# Patient Record
Sex: Female | Born: 1955
Health system: Southern US, Community
[De-identification: ages and names within clinical notes are randomized; demographics above are authoritative.]

## PROBLEM LIST (undated history)

## (undated) DIAGNOSIS — F32A Depression, unspecified: Secondary | ICD-10-CM

## (undated) DIAGNOSIS — K219 Gastro-esophageal reflux disease without esophagitis: Secondary | ICD-10-CM

## (undated) DIAGNOSIS — I499 Cardiac arrhythmia, unspecified: Secondary | ICD-10-CM

## (undated) DIAGNOSIS — Z9981 Dependence on supplemental oxygen: Secondary | ICD-10-CM

## (undated) DIAGNOSIS — E039 Hypothyroidism, unspecified: Secondary | ICD-10-CM

## (undated) DIAGNOSIS — I10 Essential (primary) hypertension: Secondary | ICD-10-CM

## (undated) DIAGNOSIS — J45909 Unspecified asthma, uncomplicated: Secondary | ICD-10-CM

## (undated) DIAGNOSIS — J449 Chronic obstructive pulmonary disease, unspecified: Secondary | ICD-10-CM

## (undated) HISTORY — DX: Depression, unspecified: F32.A

## (undated) HISTORY — DX: Unspecified asthma, uncomplicated: J45.909

## (undated) HISTORY — DX: Cardiac arrhythmia, unspecified: I49.9

## (undated) HISTORY — DX: Hypothyroidism, unspecified: E03.9

## (undated) HISTORY — PX: OTHER SURGICAL HISTORY: SHX169

---

## 2000-06-06 ENCOUNTER — Ambulatory Visit (HOSPITAL_COMMUNITY): Admission: RE | Admit: 2000-06-06 | Discharge: 2000-06-06 | Payer: Self-pay | Admitting: General Surgery

## 2000-06-26 ENCOUNTER — Ambulatory Visit (HOSPITAL_COMMUNITY): Admission: RE | Admit: 2000-06-26 | Discharge: 2000-06-26 | Payer: Self-pay | Admitting: General Surgery

## 2000-06-26 ENCOUNTER — Encounter: Payer: Self-pay | Admitting: General Surgery

## 2001-11-03 ENCOUNTER — Emergency Department (HOSPITAL_COMMUNITY): Admission: EM | Admit: 2001-11-03 | Discharge: 2001-11-03 | Payer: Self-pay | Admitting: Emergency Medicine

## 2001-11-03 ENCOUNTER — Encounter: Payer: Self-pay | Admitting: Emergency Medicine

## 2001-11-05 ENCOUNTER — Ambulatory Visit (HOSPITAL_COMMUNITY): Admission: RE | Admit: 2001-11-05 | Discharge: 2001-11-05 | Payer: Self-pay | Admitting: Internal Medicine

## 2001-11-05 ENCOUNTER — Encounter: Payer: Self-pay | Admitting: Internal Medicine

## 2002-01-27 ENCOUNTER — Ambulatory Visit (HOSPITAL_COMMUNITY): Admission: RE | Admit: 2002-01-27 | Discharge: 2002-01-27 | Payer: Self-pay | Admitting: Internal Medicine

## 2002-01-27 ENCOUNTER — Encounter: Payer: Self-pay | Admitting: Internal Medicine

## 2002-09-13 ENCOUNTER — Ambulatory Visit (HOSPITAL_COMMUNITY): Admission: RE | Admit: 2002-09-13 | Discharge: 2002-09-13 | Payer: Self-pay | Admitting: Family Medicine

## 2002-09-13 ENCOUNTER — Encounter: Payer: Self-pay | Admitting: Family Medicine

## 2003-03-10 ENCOUNTER — Inpatient Hospital Stay (HOSPITAL_COMMUNITY): Admission: AD | Admit: 2003-03-10 | Discharge: 2003-03-13 | Payer: Self-pay | Admitting: Family Medicine

## 2003-07-20 ENCOUNTER — Emergency Department (HOSPITAL_COMMUNITY): Admission: EM | Admit: 2003-07-20 | Discharge: 2003-07-20 | Payer: Self-pay | Admitting: Emergency Medicine

## 2003-07-27 ENCOUNTER — Ambulatory Visit (HOSPITAL_COMMUNITY): Admission: RE | Admit: 2003-07-27 | Discharge: 2003-07-27 | Payer: Self-pay | Admitting: Orthopaedic Surgery

## 2005-07-13 ENCOUNTER — Emergency Department (HOSPITAL_COMMUNITY): Admission: EM | Admit: 2005-07-13 | Discharge: 2005-07-13 | Payer: Self-pay | Admitting: Emergency Medicine

## 2006-07-24 ENCOUNTER — Ambulatory Visit (HOSPITAL_COMMUNITY): Admission: RE | Admit: 2006-07-24 | Discharge: 2006-07-24 | Payer: Self-pay | Admitting: Family Medicine

## 2007-03-06 ENCOUNTER — Ambulatory Visit (HOSPITAL_COMMUNITY): Admission: RE | Admit: 2007-03-06 | Discharge: 2007-03-06 | Payer: Self-pay | Admitting: Family Medicine

## 2008-09-02 ENCOUNTER — Ambulatory Visit (HOSPITAL_COMMUNITY): Admission: RE | Admit: 2008-09-02 | Discharge: 2008-09-02 | Payer: Self-pay | Admitting: Specialist

## 2009-02-10 ENCOUNTER — Inpatient Hospital Stay (HOSPITAL_COMMUNITY): Admission: EM | Admit: 2009-02-10 | Discharge: 2009-02-13 | Payer: Self-pay | Admitting: Emergency Medicine

## 2009-03-05 ENCOUNTER — Ambulatory Visit (HOSPITAL_COMMUNITY): Admission: RE | Admit: 2009-03-05 | Discharge: 2009-03-05 | Payer: Self-pay | Admitting: Family Medicine

## 2009-03-25 ENCOUNTER — Emergency Department (HOSPITAL_COMMUNITY): Admission: EM | Admit: 2009-03-25 | Discharge: 2009-03-25 | Payer: Self-pay | Admitting: Emergency Medicine

## 2009-04-01 ENCOUNTER — Encounter (HOSPITAL_COMMUNITY): Admission: RE | Admit: 2009-04-01 | Discharge: 2009-05-01 | Payer: Self-pay | Admitting: Family Medicine

## 2009-11-12 ENCOUNTER — Emergency Department (HOSPITAL_COMMUNITY): Admission: EM | Admit: 2009-11-12 | Discharge: 2009-11-12 | Payer: Self-pay | Admitting: Emergency Medicine

## 2010-02-20 ENCOUNTER — Encounter: Payer: Self-pay | Admitting: Family Medicine

## 2010-04-16 LAB — RAPID URINE DRUG SCREEN, HOSP PERFORMED
Amphetamines: NOT DETECTED
Barbiturates: NOT DETECTED
Benzodiazepines: NOT DETECTED
Cocaine: NOT DETECTED
Opiates: NOT DETECTED

## 2010-04-16 LAB — DIFFERENTIAL
Basophils Absolute: 0 10*3/uL (ref 0.0–0.1)
Eosinophils Absolute: 0 10*3/uL (ref 0.0–0.7)
Eosinophils Relative: 0 % (ref 0–5)
Eosinophils Relative: 1 % (ref 0–5)
Lymphocytes Relative: 17 % (ref 12–46)
Lymphocytes Relative: 19 % (ref 12–46)
Lymphocytes Relative: 40 % (ref 12–46)
Lymphs Abs: 0.8 10*3/uL (ref 0.7–4.0)
Lymphs Abs: 1.2 10*3/uL (ref 0.7–4.0)
Monocytes Absolute: 0.4 10*3/uL (ref 0.1–1.0)
Monocytes Relative: 12 % (ref 3–12)
Monocytes Relative: 8 % (ref 3–12)
Neutro Abs: 3.1 10*3/uL (ref 1.7–7.7)
Neutrophils Relative %: 69 % (ref 43–77)
Neutrophils Relative %: 72 % (ref 43–77)

## 2010-04-16 LAB — FOLATE: Folate: 16.4 ng/mL

## 2010-04-16 LAB — BLOOD GAS, ARTERIAL
Acid-base deficit: 1.5 mmol/L (ref 0.0–2.0)
Bicarbonate: 23.5 mEq/L (ref 20.0–24.0)
pCO2 arterial: 45.2 mmHg — ABNORMAL HIGH (ref 35.0–45.0)
pH, Arterial: 7.336 — ABNORMAL LOW (ref 7.350–7.400)

## 2010-04-16 LAB — URINALYSIS, ROUTINE W REFLEX MICROSCOPIC
Bilirubin Urine: NEGATIVE
Glucose, UA: NEGATIVE mg/dL
Ketones, ur: NEGATIVE mg/dL
Nitrite: POSITIVE — AB
Protein, ur: NEGATIVE mg/dL
pH: 6.5 (ref 5.0–8.0)

## 2010-04-16 LAB — CARDIAC PANEL(CRET KIN+CKTOT+MB+TROPI)
CK, MB: 0.5 ng/mL (ref 0.3–4.0)
CK, MB: 0.7 ng/mL (ref 0.3–4.0)
Troponin I: 0.01 ng/mL (ref 0.00–0.06)
Troponin I: 0.02 ng/mL (ref 0.00–0.06)

## 2010-04-16 LAB — FERRITIN: Ferritin: 110 ng/mL (ref 10–291)

## 2010-04-16 LAB — BASIC METABOLIC PANEL
BUN: 22 mg/dL (ref 6–23)
BUN: 4 mg/dL — ABNORMAL LOW (ref 6–23)
CO2: 28 mEq/L (ref 19–32)
CO2: 29 mEq/L (ref 19–32)
Calcium: 8.6 mg/dL (ref 8.4–10.5)
Calcium: 9.5 mg/dL (ref 8.4–10.5)
Chloride: 103 mEq/L (ref 96–112)
Chloride: 107 mEq/L (ref 96–112)
Chloride: 107 mEq/L (ref 96–112)
Creatinine, Ser: 0.86 mg/dL (ref 0.4–1.2)
Creatinine, Ser: 1.87 mg/dL — ABNORMAL HIGH (ref 0.4–1.2)
GFR calc Af Amer: >60 mL/min (ref >60)
GFR calc Af Amer: >60 mL/min (ref >60)
GFR calc non Af Amer: 28 mL/min — ABNORMAL LOW (ref >60)
GFR calc non Af Amer: >60 mL/min (ref >60)
GFR calc non Af Amer: >60 mL/min (ref >60)
Glucose, Bld: 113 mg/dL — ABNORMAL HIGH (ref 70–99)
Glucose, Bld: 95 mg/dL (ref 70–99)
Potassium: 3.4 mEq/L — ABNORMAL LOW (ref 3.5–5.1)
Potassium: 3.7 mEq/L (ref 3.5–5.1)
Potassium: 4 mEq/L (ref 3.5–5.1)
Potassium: 4.3 mEq/L (ref 3.5–5.1)
Sodium: 135 mEq/L (ref 135–145)
Sodium: 137 mEq/L (ref 135–145)
Sodium: 140 mEq/L (ref 135–145)

## 2010-04-16 LAB — LIPID PANEL
Cholesterol: 138 mg/dL (ref 0–200)
HDL: 57 mg/dL (ref >39)
Triglycerides: 77 mg/dL (ref <150)

## 2010-04-16 LAB — CBC
HCT: 30.1 % — ABNORMAL LOW (ref 36.0–46.0)
HCT: 32.6 % — ABNORMAL LOW (ref 36.0–46.0)
HCT: 34.8 % — ABNORMAL LOW (ref 36.0–46.0)
Hemoglobin: 10 g/dL — ABNORMAL LOW (ref 12.0–15.0)
Hemoglobin: 10.8 g/dL — ABNORMAL LOW (ref 12.0–15.0)
MCHC: 33.2 g/dL (ref 30.0–36.0)
MCV: 93.3 fL (ref 78.0–100.0)
MCV: 94.3 fL (ref 78.0–100.0)
Platelets: 250 10*3/uL (ref 150–400)
Platelets: 306 10*3/uL (ref 150–400)
RBC: 3.21 MIL/uL — ABNORMAL LOW (ref 3.87–5.11)
RBC: 3.31 MIL/uL — ABNORMAL LOW (ref 3.87–5.11)
RBC: 3.49 MIL/uL — ABNORMAL LOW (ref 3.87–5.11)
RDW: 12.9 % (ref 11.5–15.5)
RDW: 13.6 % (ref 11.5–15.5)
WBC: 3 10*3/uL — ABNORMAL LOW (ref 4.0–10.5)
WBC: 4.3 10*3/uL (ref 4.0–10.5)
WBC: 4.9 10*3/uL (ref 4.0–10.5)

## 2010-04-16 LAB — URINE MICROSCOPIC-ADD ON

## 2010-04-16 LAB — URINE CULTURE: Colony Count: NO GROWTH

## 2010-04-16 LAB — POCT CARDIAC MARKERS
Myoglobin, poc: 148 ng/mL (ref 12–200)
Troponin i, poc: <0.05 ng/mL (ref 0.00–0.09)

## 2010-04-16 LAB — CULTURE, BLOOD (ROUTINE X 2)

## 2010-04-16 LAB — TSH: TSH: 0.202 u[IU]/mL — ABNORMAL LOW (ref 0.350–4.500)

## 2010-04-16 LAB — IRON AND TIBC
TIBC: 239 ug/dL — ABNORMAL LOW (ref 250–470)
UIBC: 221 ug/dL

## 2010-04-16 LAB — BRAIN NATRIURETIC PEPTIDE: Pro B Natriuretic peptide (BNP): <30 pg/mL (ref 0.0–100.0)

## 2010-04-16 LAB — HIV ANTIBODY (ROUTINE TESTING W REFLEX): HIV: NONREACTIVE

## 2010-04-16 LAB — CORTISOL: Cortisol, Plasma: 17 ug/dL
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
## 2010-06-01 ENCOUNTER — Emergency Department (HOSPITAL_COMMUNITY): Payer: BC Managed Care – PPO

## 2010-06-01 ENCOUNTER — Encounter (HOSPITAL_COMMUNITY): Payer: Self-pay

## 2010-06-01 ENCOUNTER — Emergency Department (HOSPITAL_COMMUNITY)
Admission: EM | Admit: 2010-06-01 | Discharge: 2010-06-01 | Disposition: A | Discharge status: Home / Self Care | Payer: BC Managed Care – PPO | Attending: Emergency Medicine | Admitting: Emergency Medicine

## 2010-06-01 DIAGNOSIS — J4489 Other specified chronic obstructive pulmonary disease: Secondary | ICD-10-CM | POA: Insufficient documentation

## 2010-06-01 DIAGNOSIS — J449 Chronic obstructive pulmonary disease, unspecified: Secondary | ICD-10-CM | POA: Insufficient documentation

## 2010-06-01 DIAGNOSIS — F329 Major depressive disorder, single episode, unspecified: Secondary | ICD-10-CM | POA: Insufficient documentation

## 2010-06-01 DIAGNOSIS — D649 Anemia, unspecified: Secondary | ICD-10-CM | POA: Insufficient documentation

## 2010-06-01 DIAGNOSIS — R079 Chest pain, unspecified: Secondary | ICD-10-CM | POA: Insufficient documentation

## 2010-06-01 DIAGNOSIS — F3289 Other specified depressive episodes: Secondary | ICD-10-CM | POA: Insufficient documentation

## 2010-06-01 DIAGNOSIS — I10 Essential (primary) hypertension: Secondary | ICD-10-CM | POA: Insufficient documentation

## 2010-06-01 DIAGNOSIS — Z79899 Other long term (current) drug therapy: Secondary | ICD-10-CM | POA: Insufficient documentation

## 2010-06-01 LAB — CBC
HCT: 34.4 % — ABNORMAL LOW (ref 36.0–46.0)
Hemoglobin: 11.3 g/dL — ABNORMAL LOW (ref 12.0–15.0)
MCH: 30.8 pg (ref 26.0–34.0)
MCHC: 32.8 g/dL (ref 30.0–36.0)
MCV: 93.7 fL (ref 78.0–100.0)
Platelets: 325 10*3/uL (ref 150–400)
RBC: 3.67 MIL/uL — ABNORMAL LOW (ref 3.87–5.11)
RDW: 13.5 % (ref 11.5–15.5)
WBC: 4.2 10*3/uL (ref 4.0–10.5)

## 2010-06-01 LAB — COMPREHENSIVE METABOLIC PANEL
ALT: 15 U/L (ref 0–35)
AST: 22 U/L (ref 0–37)
Albumin: 3.9 g/dL (ref 3.5–5.2)
Alkaline Phosphatase: 119 U/L — ABNORMAL HIGH (ref 39–117)
BUN: 28 mg/dL — ABNORMAL HIGH (ref 6–23)
CO2: 30 mEq/L (ref 19–32)
Calcium: 10.3 mg/dL (ref 8.4–10.5)
Chloride: 99 mEq/L (ref 96–112)
Creatinine, Ser: 1.2 mg/dL (ref 0.4–1.2)
GFR calc Af Amer: 57 mL/min — ABNORMAL LOW (ref >60)
GFR calc non Af Amer: 47 mL/min — ABNORMAL LOW (ref >60)
Glucose, Bld: 99 mg/dL (ref 70–99)
Potassium: 4.2 mEq/L (ref 3.5–5.1)
Sodium: 135 mEq/L (ref 135–145)
Total Bilirubin: 0.3 mg/dL (ref 0.3–1.2)
Total Protein: 8.7 g/dL — ABNORMAL HIGH (ref 6.0–8.3)

## 2010-06-01 LAB — DIFFERENTIAL
Basophils Absolute: 0 10*3/uL (ref 0.0–0.1)
Basophils Relative: 1 % (ref 0–1)
Eosinophils Absolute: 0.1 10*3/uL (ref 0.0–0.7)
Eosinophils Relative: 1 % (ref 0–5)
Lymphocytes Relative: 34 % (ref 12–46)
Lymphs Abs: 1.4 10*3/uL (ref 0.7–4.0)
Monocytes Absolute: 0.6 10*3/uL (ref 0.1–1.0)
Monocytes Relative: 14 % — ABNORMAL HIGH (ref 3–12)
Neutro Abs: 2.1 10*3/uL (ref 1.7–7.7)
Neutrophils Relative %: 51 % (ref 43–77)

## 2010-06-01 LAB — POCT CARDIAC MARKERS
CKMB, poc: <1 ng/mL — ABNORMAL LOW (ref 1.0–8.0)
Myoglobin, poc: 85.1 ng/mL (ref 12–200)
Troponin i, poc: <0.05 ng/mL (ref 0.00–0.09)

## 2010-06-01 LAB — PROTIME-INR: Prothrombin Time: 12.9 seconds (ref 11.6–15.2)

## 2010-06-01 LAB — D-DIMER, QUANTITATIVE: D-Dimer, Quant: 0.77 ug/mL-FEU — ABNORMAL HIGH (ref 0.00–0.48)

## 2010-06-01 MED ORDER — TECHNETIUM TO 99M ALBUMIN AGGREGATED
6.0000 | Freq: Once | INTRAVENOUS | Status: AC | PRN
  Administered 2010-06-01: 4.6 via INTRAVENOUS

## 2010-06-14 NOTE — Procedures (Signed)
[name], [name]                 ACCOUNT NO.:  [id number]   MEDICAL RECORD NO.:  [id number]          PATIENT TYPE:  OUT   LOCATION:  RESP                          FACILITY:  [facility]   PHYSICIAN:  Kofi A. Gerilyn Pilgrim, M.D. DATE OF BIRTH:  04-May-1955   DATE OF PROCEDURE:  03/06/2007  DATE OF DISCHARGE:                              EEG INTERPRETATION   This is a 56 year old who presents with spells of syncope suspicious for  seizure activity.   MEDICATIONS:  Lexapro, Advair and blood pressure medications.   ANALYSIS:  A 16-channel recording is conducted for 21 minutes.  There is  a well-formed posterior rhythm of 12 to 12.5 hertz which attenuates with  eye opening.  There is beta activity able to be observed in the frontal  areas.  Photic stimulation is conducted without significant changes in  the background activity.  Hypoventilation is not done.  There is no  focal or lateralized slowing.  There is no epileptiform activity  observed.   IMPRESSION:  This is a normal recording in awake and drowsy states.  This signal recording does not rule out epileptic seizures are  clinically indicated.  A sleep deprived recording could be useful.      Kofi A. Gerilyn Pilgrim, M.D.  Electronically Signed     KAD/MEDQ  D:  03/06/2007  T:  03/07/2007  Job:  [id number]   cc:   Annia Friendly. Loleta Chance, MD  Fax: [phone]

## 2010-06-17 NOTE — Procedures (Signed)
NAME:  Grace Cross, Grace Cross                           ACCOUNT NO.:  [id number]   MEDICAL RECORD NO.:  [id number]                   PATIENT TYPE:  INP   LOCATION:  A204                                 FACILITY:  [facility]   PHYSICIAN:  Vida Roller, M.D.                DATE OF BIRTH:  [birth year]   DATE OF PROCEDURE:  DATE OF DISCHARGE:                                  ECHOCARDIOGRAM   PRIMARY CARE PHYSICIAN:  Dr. Evlyn Courier.   INDICATION:  This is a 55 year old female with syncope.   TECHNICAL QUALITY:  Poor secondary to the patient's body habitus.  The image  quality is good.  We were not able to obtained all of the adequate images in  the standard planes.  This is a very small woman who weighs less than 100  pounds.   M-MODE TRACINGS:  1. The aorta is 26 mm.  2. The left atrium is 29 mm.  3. The septum is 10 mm.  4. Posterior wall is 8 mm.  5. Left ventricular diastolic dimension is 31 mm.  6. Left ventricular systolic dimension is 20 mm.   2-D AND DOPPLER IMAGING:  1. The left ventricle is normal size with normal systolic function.     Diastolic function appears to be normal as well.  There are no wall     motion abnormalities seen.  Overall ejection fraction is estimated at 55     to 60%.  2. The right ventricle is normal size with normal systolic function.  3. Both atria appear to be normal size.  There is no atrial septum defect.  4. The aortic valve is trileaflet, tricommisural.  There is no evidence of     stenosis or regurgitation.  5. The mitral valve is morphologically unremarkable with trivial     insufficiency.  No stenosis is seen.  6. The tricuspid valve is also morphologically unremarkable with trivial     insufficiency.  7. The pulmonic valve is not well seen.  8. The inferior vena cava appears to be normal size.  9. The pericardial structures appear normal.  10.      The ascending aorta was not well seen.      ___________________________________________                                         Vida Roller, M.D.   JH/MEDQ  D:  03/10/2003  T:  03/10/2003  Job:  [id number]

## 2010-06-17 NOTE — H&P (Signed)
NAME:  Grace Cross, Grace Cross                           ACCOUNT NO.:  [id number]   MEDICAL RECORD NO.:  [id number]                   PATIENT TYPE:  OBV   LOCATION:  A204                                 FACILITY:  [facility]   PHYSICIAN:  Annia Friendly. Loleta Chance, M.D.                DATE OF BIRTH:  1956/01/13   DATE OF ADMISSION:  03/09/2003  DATE OF DISCHARGE:                                HISTORY & PHYSICAL   HISTORY:  The patient is a 55 year old single black female, an employee at  [employer], from [place], [place].  She  complained of fainting spell at home on March 08, 2003 around 2100 hours.  Patient was going to the bathroom when she found herself lying on the floor.  She experienced urinary incontinence.  She had a near fainting spell on  March 07, 2003 while going to the bathroom at home.  History is negative  for headache, nausea, vomiting, fast heartbeat, shortness of breath, known  trauma to head, unexplained fever, etc.  Patient did experience chest  tightness before onset of syncope.   Medical history is positive for hypertension, sliding hiatal hernia, chronic  bronchitis, and chronic depression.  Medical history is negative for  diabetes, tuberculosis, cancer, sickle cell, asthma and known seizure  disorder.   Prescribed meds on admission are Toprol XL 25 mg p.o. once daily, Lexapro 10  mg p.o. once daily, Prevacid 30 mg p.o. once daily.  The patient is allergic  to PENICILLIN.   Past medical history is positive for hospitalization for pregnancy.   HABITS:  Positive for former cigarette smoking x2-1/2 years and former use  of ethanol x2-1/2 years.  Patient denied use of street drugs.   FAMILY HISTORY:  Mother living age 44 with history of hypertension; father  deceased in his 61s secondary to complications of stroke; one sister living  age 83 with history of diabetes mellitus; three brothers living in their 65s  health unknown; one daughter living age  49 good health.   SEXUALLY TRANSMITTED DISEASE HISTORY:  Negative for gonorrhea, syphilis,  herpes, and HIV infection.   REVIEW OF SYSTEMS:  Negative for diarrhea, gross hematuria, melena,  hematochezia, epistaxis, diplopia, dysphagia, hemoptysis, chronic cough,  wheezing, vaginal bleeding, edema of legs, dysuria, etc.  Last menstrual  period when the patient late 70s.   PHYSICAL EXAMINATION:  VITALS IN THE OFFICE:  Pulse 80, respirations 20,  blood pressure 95/50 sitting.  GENERAL APPEARANCE:  Small-framed slight medium-height alert middle-aged  black female in no apparent respiratory distress.  HEAD:  Negative for laceration, nausea, or contusion.  EARS:  Normal auricles.  External canals patent.  Tympanic membrane gray.  EYES:  Lids negative for ptosis.  Sclerae are white.  Pupils round, equal,  and reactive to light.  Extraocular movements intact.  NOSE:  Negative for discharge.  MOUTH:  Dentition poor with few teeth and positive  for cavities.  No oral  lesion.  No laceration of tongue.  No oral mucosa lesion.  No bleeding gum.  Posterior pharynx benign.  NECK:  Negative for lymphadenopathy or thyromegaly.  Supraclavicular space  no palpable nodes.  LUNGS:  Clear.  Medial aspect of her left scapula positive for erythema and  macula.  Mild tenderness on palpation.  HEART:  Audible S1 and S2, without murmur, regular rate and rhythm.  BREASTS:  Small.  No skin changes.  No nodule on palpation.  Nipples erect  without discharge.  ABDOMEN:  No distention.  Hypoactive bowel sounds, soft, nontender in all  four quadrants.  No palpable mass.  No organomegaly.  PELVIC:  Deferred.  RECTAL:  Deferred.  EXTREMITIES:  No edema.  No joint swelling.  No joint redness.  No joint  hotness.  NEUROLOGIC:  Alert and oriented to person, place, and time.  Cranial nerves  II-XII appeared intact.   LABORATORIES:  White count 3, hemoglobin 11.3, hematocrit 35, platelets  302,000.   IMPRESSION:   Primary acute syncope with urinary incontinence.   SECONDARY DIAGNOSES:  1. Hypertension.  2. Hiatal hernia.  3. Chronic bronchitis.  4. Chronic depression.   PLAN:  Admit to telemetry.  IV normal saline at 50 mL/hr.  Laboratories -  thyroid panel, magnesium level, MET-7, liver function test, urinalysis,  urine drug screen, cardiac enzymes, chest x-ray, CT of the head without  contrast, EEG, neurology consult.  Diet low salt, general soft.  Neuro  checks every 2 hours x 24 hours.  EKG.  Activity out of bed with bathroom  privileges.  Toprol XL 25 mg p.o. once daily, Lexapro 10 mg p.o. once daily,  Prevacid 30 mg p.o. once daily.     ___________________________________________                                         Annia Friendly. Loleta Chance, M.D.   Levonne Hubert  D:  03/09/2003  T:  03/09/2003  Job:  [id number]

## 2010-06-17 NOTE — Consult Note (Signed)
NAME:  Grace Cross, Grace Cross                           ACCOUNT NO.:  [id number]   MEDICAL RECORD NO.:  [id number]                   PATIENT TYPE:  OBV   LOCATION:  A204                                 FACILITY:  [facility]   PHYSICIAN:  Kofi A. Gerilyn Pilgrim, M.D.              DATE OF BIRTH:  [birth year]   DATE OF CONSULTATION:  03/09/2003  DATE OF DISCHARGE:                                   CONSULTATION   NEUROLOGY CONSULTATION:   REASON FOR CONSULTATION:  Syncope, possible seizures.   IMPRESSION:  Recurrent syncopal episodes with mostly unclear etiology at  this time.  The semiology does not appear to be __________ .  There is  however, evidence of polyneuropathy on physical examination which may  suggest global neuropathy affect in the autonomic nervous system causing  syncope.   RECOMMENDATIONS:  I agree with orthostatics that have been ordered.  We  would like to try to do one as soon as we can.  She also has several things  ordered that we will follow up on including EEG.  Neuropathy workup would  also be started which will include blood test for vitamin B12 level,  homocysteine, methylmalonic acid level.  She already has thyroid function  test that has been ordered.  We may also do a sed rate and ANA.   HISTORY:  [age]-year-old African-American lady who reports having  recurrent blacking out spells over the past year.  She has had about five  total spells in the last several months.  The semiology seems somewhat  similar in all cases, she simply blacks out.  She reports having some fine  tremors of the hands with these spells, particularly the previous spell.  No  clear tonic activity or clonic activity noted.  She is usually out for 5  minutes.  She had one such spell a couple of nights ago when she apparently  was ambulating to the bathroom when she passed out to the floor.  She was  out for 5 minutes.  Again there is no oral trauma reported, there is no  stiffening or clonic  activity, she did report urinary incontinence with this  spell which is unusual.  She was found on the ground by a relative.  The  patient does report having some light-headed dizzy spells before some of  these events.  There is no prior history of seizure activity.  No history of  head trauma or headaches.   PAST MEDICAL HISTORY:  1. Hypertension.  2. Depression.  3. Chronic bronchitis.  4. Hiatal hernia.  5. Gastroesophageal reflux disease.   ADMISSION MEDICATIONS:  Lexapro, Toprol, and Prevacid.   CURRENT MEDICATIONS:  Lexapro, Toprol, Protonix.   REVIEW OF SYSTEMS:  Apparently has some nausea and emesis over the weekend.  Apparently has problems with numbness in the hands.   SOCIAL HISTORY:  Resides in [place].  She is single.  Quit using  alcohol  and tobacco about 2-1/2 to 3 years ago.  She apparently did consume quite a  bit of alcohol on a daily basis.  She has one daughter.  No drug use.  No  herbal medicine use.   FAMILY HISTORY:  Congestive heart failure, atrial fibrillation.   PHYSICAL EXAMINATION:  GENERAL:  Thin lady no acute distress.  VITAL SIGNS:  Temperature 97.4, pulse 89, blood pressure 71/47, respirations  20.  Some orthostatics were done but without pulse - laying blood pressure  104/74, sitting 78/56, and standing 110/74.  NECK:  Supple.  CHEST:  Lungs are clear to auscultation bilaterally.  Cardiovascular exam  reveals normal S1 and S2.  ABDOMEN:  Soft.  NEUROLOGICAL:  The patient is awake, alert, she converses without any  cognitive impairment, speech and language are unremarkable.  Cranial nerves  II-XII are intact.  Motor examination shows normal tone, bulk, and strength.  There is no pronator drift, coordination is normal, reflexes are markedly  diminished and they are obtained slightly only with augmentation, toes are  both downgoing, gait is normal.      ___________________________________________                                             Darleen Crocker A. Gerilyn Pilgrim, M.D.   KAD/MEDQ  D:  03/09/2003  T:  03/09/2003  Job:  [id number]

## 2010-06-17 NOTE — Discharge Summary (Signed)
NAME:  Grace Cross, Grace Cross                           ACCOUNT NO.:  [id number]   MEDICAL RECORD NO.:  [id number]                   PATIENT TYPE:  INP   LOCATION:  A319                                 FACILITY:  [facility]   PHYSICIAN:  Annia Friendly. Loleta Chance, M.D.                DATE OF BIRTH:  [birth year]   DATE OF ADMISSION:  03/09/2003  DATE OF DISCHARGE:  03/13/2003                                 DISCHARGE SUMMARY   The patient was a 55 year old, single black female employee at [employer] from [place], [place].   CHIEF COMPLAINT:  Fainting spell at home on March 08, 2003 around 2100.  The patient was doing to the back room when she found herself lying on the  floor. She also experienced urinary incontinence.  The patient had a near  fainting spell on March 07, 2003 while going to the bathroom at home.  History is negative for headache, nausea, vomiting, fast heart beat,  shortness of breath, known trauma to head, unexplained fever, etc.  The  patient did experience chest tightness before onset of syncope.   MEDICAL HISTORY:  Positive for hypertension, sliding hiatal hernia, chronic  bronchitis and chronic depression.   ALLERGIES:  The patient was allergic to PENICILLIN.   PAST MEDICAL HISTORY:  Past medical history positive for hospitalization for  pregnancy.   HABITS:  Positive for former cigarette smoking x2-1/2 years and former use  of ethanol x2-1/2 years. The patient denied the use of street drugs.   FAMILY HISTORY:  1. Mother, living, age 84 with a history of hypertension.  2. Father, deceased, in his 33s secondary to complications of a stroke.  3. One sister, living, age 38 with a history of diabetes mellitus.  4. Three brothers,  living, in their 74s health unknown.  5. One daughter, living, age 26 good health.   HOSPITAL COURSE BY PROBLEMS:  Problem #1:  SYNCOPE SECONDARY TO ORTHOSTATIC  CHANGES.  General appearance revealed a middle-age,  small-framed, slightly  short, black female in no apparent respiratory distress.  Head negative for  laceration, nausea, or contusion.  Ears demonstrated patent external canals.  Tympanic membranes were pearly gray.  Eyes revealed no ptosis of lids.  Sclerae were white.  Pupils were round, equal, and reacted to  light.  Extraocular movements were intact.  Nose negative for discharge.  Mouth  demonstrated poor dentition with a few teeth.  Positive for cavity.  Oro  cavity demonstrated no oral lesions or lacerations of the tongue.  Gums were  negative for bleeding.  Posterior pharynx was benign.  Neck demonstrated no  adenopathy or thyromegaly.  Lungs were clear.  Heart revealed audible S1, S2  without murmur.  Rhythm was regular and rate within normal limits.  Abdomen  demonstrated no distention with hypoactive bowel sounds.  Abdominal exam  demonstrated no palpable mass or organomegaly.   Chest x-ray  on admission demonstrated no active disease.  Heart size and  mediastinal contour were normal.  CT of the head without contrast was read  as no acute intracranial abnormality by Dr. Maryelizabeth Rowan on March 09, 2003.  EKG on March 09, 2003 demonstrated normal sinus rhythm and was read  as normal EKG by Dr. Kari Baars.   Significant labs on admission were as follows:  White count 3.0, hemoglobin  11.7, hematocrit 35.0, platelets 302,000.  Sodium 135, potassium 4.0,  chloride 101, CO2 30, glucose 97, BUN 18, creatinine 0.8, calcium 9.1, total  protein 6.7, albumin 3.0, AST 18, ALT 17, ALP 125, total bilirubin 0.3.  Magnesium level 1.9, total CPK 55, CK/MB 1.6, troponin I less than 0.01.  Because of the patient's presentation of chest tightness and syncope a  cardiology consult was ordered.  Urine drug screen was ordered and it was  negative.  Urinalysis demonstrated the following:  Specific gravity 1.015,  pH 6.0, no glucose, no hemoglobin, no bilirubin, no ketones, no protein,   nitrate negative.  Cardiology did see the patient during this  hospitalization. Cardiology saw the patient on March 09, 2003.  Cardiology  conclusion was a syncopal episode of unclear etiology.  Dr. Dorethea Clan,  cardiologist, thought the patient needed orthostatic vital signs.  He did  not think that she needed a stress Cardiolite study at this point.  He did  suggest an echocardiogram.  He indicated that her hypertension was well-  controlled at the time of admission.  Because of low blood pressure reading  on March 10, 2003 Toprol XL was discontinued.  Orthostatic blood pressure  readings were taken during this hospitalization.  The patient did show  changes with decrease in blood pressure from supine-to-sitting-to-standing.  The patient received hydration.  A repeat cardiac enzyme on March 10, 2003  revealed total CPK of 16, and CPK/MB of 1.6.  Other cardiac enzymes on  March 10, 2003 revealed a value of 55 for total CPK and 1.3 for CK/MB and  troponin I less than 0.01.  EKG on March 10, 2003 continued to demonstrate  a normal sinus rhythm and the conclusion was a normal EKG.   The patient's hospital course was essentially uneventful. She remained alert  and oriented to person, place and time throughout this hospitalization.  Blood pressure reading, on the morning of discharge, was 110/79.  She was  advised not to take any antihypertensive medication.  Moreover she was taken  off a salt restriction.  She was advised to stand in place a few minutes  before ambulating.  She did show some systolic below 90 at times before  discharge.  She was not complaining of dizziness or fainting at the time of  discharge.   The patient also was evaluated by the neurologist during this  hospitalization because of loss of urine with one of her syncopal episodes.  Dr. Gerilyn Pilgrim, a neurologist, saw the patient on March 09, 2003.  His impression was recurrent syncopal episode with mostly unclear  etiology at  the time of admission.  He denied that she was experiencing loss of  consciousness secondary to seizure activity.  He indicated that there was  evidence of polyneuropathy on physical examination which may suggest global  neuropathy affecting the autonomic nervous system causing syncope.  An EEG  was ordered during this hospitalization.  Results were pending at the time  of discharge.  Dr. Gerilyn Pilgrim also ordered other labs during this  hospitalization.   Other  labs during this hospitalization were as follows: RPR nonreactive.  ANA titer greater than 1:1280 (reference range 1:42, 1:80 ratio weakly  positive, not clinically significant).  Greater or equal to 1:60 ratio was  felt to be clinical significant.  ANA pattern was speckled.  ANA qualitative  was positive.  Methylmalonics quantitative was pending at the time of  discharge.  B12 level was 297 PCG/mL. Thyroid function revealed the  following:  TSH 0.515 mIU/mL (normal 0.3-5.5) and free T4 0.88 (normal 0.89  to 1.80 ng/mL).  The patient will be referred to a rheumatologist because of  her positive ANA titer.  The patient was discharged to her home on March 13, 2003.  Other significant labs during the hospitalization were as  follows:  Homocysteine 10.46 zumos/L (normal 5.0-13.9), serum immunofixation  total protein 6.9 gm/dL (normal 1.6-1.0), serum IgG 1980 mg/dL (normal 960-  4540), serum IgA 416 mg/dL (normal 98-119), serum IgM 125 mg/dL (normal 60-  147).  A copy of all these labs will be sent to rheumatologist, Dr. Kellie Simmering,  for interpretation and evaluation.   Problem #2:  ACUTE GASTRITIS OF THE ANTRUM AND THE BODY OF THE STOMACH.  The  patient, during her hospitalization, was experiencing midepigastric pain.  Because of the history of former chronic ethanol use it was thought that a  diagnostic EGD was needed.  Also because of the patient's fair appetite and  her weight it was felt that an EGD were also needed for  this reason.  An EGD  was done on March 12, 2003 by Dr. Maggie Schwalbe without complications.  Findings were as follows:  (1) The hypopharynx appeared normal.  (2) The  esophagus appeared normal.  (3) The gastroesophageal junction appeared  normal.  (4) Acute gastritis of the antrum and the body of the stomach.  (5)  The pylorus appeared normal.  (6) The duodenum appeared normal.  The patient  was already on Protonix.  She was tolerating her diet, without problems, at  the time of discharge.  She was not complaining of rectal bleeding or  vomiting at the time of discharge.   Problem #3:  CHRONIC DEPRESSION.  The patient was treated with Lexapro as  before admission.  Her mood was cooperative, affect was smiling.  Long and  short term memory were intact.  She did not experience any crying spells,  visual hallucination, tactile hallucination or auditory hallucinations.  She denied suicidal ideation.  The patient will be continued on Lexapro as an  outpatient.   INSTRUCTIONS AT THE TIME OF DISCHARGE:  1. Diet:  Regular.  2. Activity:  As tolerated.   DISCHARGE MEDICATIONS:  1. Prevacid 30 mg p.o. daily.  2. Lexapro 10 mg p.o. daily.  3. Avoid aspirin-like products.  4. Continue to avoid products containing alcohol.   ADDENDUM:  Ultrasound of gallbladder on March 12, 2003 was read as  normal.  Echocardiogram on March 10, 2003 was read by Dr. Vida Roller  as follows:  The aorta was 26 mm.  The left atrium was 29 mm.  The septum  was 10 mm.  The posterior wall was 8 mm.  The left ventricle diastolic  dimension was 31 mm.  The left ventricular systolic dimension was 20 mm.  The left ventricle was normal in size with normal systolic function.  The  diastolic function appeared to be normal as well.  There were no wall motion  abnormalities; overall ejection fraction was 55-60%.  The right ventricle  was normal size with normal systolic function; both atria appeared to be  normal size;  there was no atrial septum defect; the aortic valve was  trileaflet, tricommisural; there was no evidence of stenosis or  regurgitation; the mitral valve was morphologically unremarkable with  trivial insufficiency; no stenosis was seen; the tricuspid valve was  morphologically unremarkable with trivial insufficiency; the pulmonic valve  was not well seen; the inferior vena cava appeared to be normal size; the  pericardial structures appeared normal; the ascending aorta was not well  seen.  The EEG interpretation by Dr. Gerilyn Pilgrim that was done during this  hospitalization on March 10, 2003 was as follows:  This was a normal  recording of awake and sleep state.   PRIMARY IMPRESSION:  Syncope secondary to orthostatic changes.   SECONDARY DIAGNOSES:  1. Acute gastritis of the antrum of the stomach.  2. Chronic depression.  3. Adenoma __________.     ___________________________________________                                         Annia Friendly. Loleta Chance, M.D.   Levonne Hubert  D:  03/15/2003  T:  03/15/2003  Job:  [id number]

## 2011-03-17 ENCOUNTER — Emergency Department (HOSPITAL_COMMUNITY)
Admission: EM | Admit: 2011-03-17 | Discharge: 2011-03-17 | Disposition: A | Discharge status: Home / Self Care | Payer: BC Managed Care – PPO | Attending: Emergency Medicine | Admitting: Emergency Medicine

## 2011-03-17 ENCOUNTER — Encounter (HOSPITAL_COMMUNITY): Payer: Self-pay

## 2011-03-17 DIAGNOSIS — R112 Nausea with vomiting, unspecified: Secondary | ICD-10-CM | POA: Insufficient documentation

## 2011-03-17 DIAGNOSIS — I10 Essential (primary) hypertension: Secondary | ICD-10-CM | POA: Insufficient documentation

## 2011-03-17 DIAGNOSIS — R1013 Epigastric pain: Secondary | ICD-10-CM | POA: Insufficient documentation

## 2011-03-17 DIAGNOSIS — I959 Hypotension, unspecified: Secondary | ICD-10-CM | POA: Insufficient documentation

## 2011-03-17 DIAGNOSIS — J45909 Unspecified asthma, uncomplicated: Secondary | ICD-10-CM | POA: Insufficient documentation

## 2011-03-17 DIAGNOSIS — Z79899 Other long term (current) drug therapy: Secondary | ICD-10-CM | POA: Insufficient documentation

## 2011-03-17 DIAGNOSIS — K529 Noninfective gastroenteritis and colitis, unspecified: Secondary | ICD-10-CM

## 2011-03-17 DIAGNOSIS — E860 Dehydration: Secondary | ICD-10-CM

## 2011-03-17 DIAGNOSIS — R05 Cough: Secondary | ICD-10-CM | POA: Insufficient documentation

## 2011-03-17 DIAGNOSIS — R51 Headache: Secondary | ICD-10-CM | POA: Insufficient documentation

## 2011-03-17 DIAGNOSIS — K5289 Other specified noninfective gastroenteritis and colitis: Secondary | ICD-10-CM | POA: Insufficient documentation

## 2011-03-17 DIAGNOSIS — J3489 Other specified disorders of nose and nasal sinuses: Secondary | ICD-10-CM | POA: Insufficient documentation

## 2011-03-17 DIAGNOSIS — R070 Pain in throat: Secondary | ICD-10-CM | POA: Insufficient documentation

## 2011-03-17 DIAGNOSIS — K219 Gastro-esophageal reflux disease without esophagitis: Secondary | ICD-10-CM | POA: Insufficient documentation

## 2011-03-17 DIAGNOSIS — R059 Cough, unspecified: Secondary | ICD-10-CM | POA: Insufficient documentation

## 2011-03-17 DIAGNOSIS — R42 Dizziness and giddiness: Secondary | ICD-10-CM | POA: Insufficient documentation

## 2011-03-17 DIAGNOSIS — R197 Diarrhea, unspecified: Secondary | ICD-10-CM | POA: Insufficient documentation

## 2011-03-17 HISTORY — DX: Gastro-esophageal reflux disease without esophagitis: K21.9

## 2011-03-17 HISTORY — DX: Essential (primary) hypertension: I10

## 2011-03-17 LAB — BASIC METABOLIC PANEL
Calcium: 9.9 mg/dL (ref 8.4–10.5)
GFR calc Af Amer: 48 mL/min — ABNORMAL LOW (ref >90)
GFR calc non Af Amer: 41 mL/min — ABNORMAL LOW (ref >90)
Sodium: 135 mEq/L (ref 135–145)

## 2011-03-17 LAB — CBC
Platelets: 296 10*3/uL (ref 150–400)
RBC: 3.83 MIL/uL — ABNORMAL LOW (ref 3.87–5.11)
RDW: 12.9 % (ref 11.5–15.5)
WBC: 3.5 10*3/uL — ABNORMAL LOW (ref 4.0–10.5)

## 2011-03-17 MED ORDER — SODIUM CHLORIDE 0.9 % IV SOLN
INTRAVENOUS | Status: DC
  Administered 2011-03-17: 21:00:00 via INTRAVENOUS

## 2011-03-17 MED ORDER — ONDANSETRON HCL 4 MG/2ML IJ SOLN: 4.0000 mg | Freq: Once | INTRAMUSCULAR | Status: DC

## 2011-03-17 MED ORDER — SODIUM CHLORIDE 0.9 % IV BOLUS (SEPSIS)
1000.0000 mL | Freq: Once | INTRAVENOUS | Status: AC
  Administered 2011-03-17: 1000 mL via INTRAVENOUS

## 2011-03-17 NOTE — ED Provider Notes (Signed)
History   This chart was scribed for Shelda Jakes, MD by Melba Coon. The patient was seen in room APA11/APA11 and the patient's care was started at 6:40PM.    CSN: [id number]  Arrival date & time 03/17/11  1739   First MD Initiated Contact with Patient 03/17/11 1826      Chief Complaint  Patient presents with  . Dehydration  . Hypotension  . Dizziness    (Consider location/radiation/quality/duration/timing/severity/associated sxs/prior treatment) HPI Grace Cross is a 56 y.o. female who presents to the Emergency Department complaining of constant, moderate to severe dehydration and hypotension with an onset 2 days ago. N/v/d present at onset but no n/v/d today. Pt went to PCP for a f/u checking on previously high BP; pt was given BP meds to lower BP last visit, but at f/u visit, pt was experiencing low BP. PCP recommended that the pt present to the ED. Pt states PCP did not change Rx. Slight epigastric abd pain, HA, slight sore throat, non-productive dry cough, and nasal congestion present. No CP, neck pain, back pain, edema, rash, or dysuria. Blood pressure was returning to nml at time of PE (127/68)  PCP: Dr Loleta Chance  Past Medical History  Diagnosis Date  . Hypertension   . Asthma   . Acid reflux     History reviewed. No pertinent past surgical history.  No family history on file.  History  Substance Use Topics  . Smoking status: Never Smoker   . Smokeless tobacco: Not on file  . Alcohol Use: No    OB History    Grav Para Term Preterm Abortions TAB SAB Ect Mult Living                  Review of Systems 10 Systems reviewed and are negative for acute change except as noted in the HPI.  Allergies  Penicillins  Home Medications   Current Outpatient Rx  Name Route Sig Dispense Refill  . ALBUTEROL SULFATE HFA 108 (90 BASE) MCG/ACT IN AERS Inhalation Inhale 2 puffs into the lungs every 6 (six) hours as needed. For shortness of breath    . ESCITALOPRAM  OXALATE 10 MG PO TABS Oral Take 10 mg by mouth daily.    Marland Kitchen FLUTICASONE-SALMETEROL 100-50 MCG/DOSE IN AEPB Inhalation Inhale 1 puff into the lungs every 12 (twelve) hours.    . IBUPROFEN 600 MG PO TABS Oral Take 600 mg by mouth 4 (four) times daily as needed. For pain    . METHOCARBAMOL 750 MG PO TABS Oral Take 1,500 mg by mouth 4 (four) times daily.    . CENTRUM SILVER PO Oral Take 1 tablet by mouth daily.    . NEBIVOLOL HCL 20 MG PO TABS Oral Take 1 tablet by mouth daily.    Marland Kitchen OMEPRAZOLE 20 MG PO CPDR Oral Take 20 mg by mouth daily. For acid reflux      BP 98/68  Pulse 70  Temp(Src) 98.1 F (36.7 C) (Oral)  Resp 20  Ht 5\' 1"  (1.549 m)  Wt 104 lb (47.174 kg)  BMI 19.65 kg/m2  SpO2 100%  Physical Exam  Nursing note and vitals reviewed. Constitutional: She appears well-developed and well-nourished.       Awake, alert, nontoxic appearance.  HENT:  Head: Normocephalic and atraumatic.       Mucous membranes clear and relatively moist (slight dryness)  Eyes: Conjunctivae are normal. Pupils are equal, round, and reactive to light. Right eye exhibits no discharge. Left eye  exhibits no discharge.  Neck: Normal range of motion. Neck supple.  Cardiovascular: Normal rate, regular rhythm and intact distal pulses.   No murmur heard. Pulses:      Radial pulses are 2+ on the right side, and 2+ on the left side.  Pulmonary/Chest: Effort normal and breath sounds normal. She has no wheezes. She exhibits no tenderness.  Abdominal: Soft. Bowel sounds are normal. There is no tenderness. There is no rebound.  Musculoskeletal: Normal range of motion. She exhibits no edema and no tenderness.       Baseline ROM, no obvious new focal weakness.  Neurological:       Mental status and motor strength appears baseline for patient and situation.  Skin: Skin is warm. No rash noted.  Psychiatric: She has a normal mood and affect.    ED Course  Procedures (including critical care time)  DIAGNOSTIC  STUDIES: Oxygen Saturation is100% on room air, normal by my interpretation.    COORDINATION OF CARE:  Results for orders placed during the hospital encounter of 03/17/11  BASIC METABOLIC PANEL      Component Value Range   Sodium 135  135 - 145 (mEq/L)   Potassium 4.0  3.5 - 5.1 (mEq/L)   Chloride 100  96 - 112 (mEq/L)   CO2 28  19 - 32 (mEq/L)   Glucose, Bld 119 (*) 70 - 99 (mg/dL)   BUN 22  6 - 23 (mg/dL)   Creatinine, Ser 8.29 (*) 0.50 - 1.10 (mg/dL)   Calcium 9.9  8.4 - 56.2 (mg/dL)   GFR calc non Af Amer 41 (*) >90 (mL/min)   GFR calc Af Amer 48 (*) >90 (mL/min)  CBC      Component Value Range   WBC 3.5 (*) 4.0 - 10.5 (K/uL)   RBC 3.83 (*) 3.87 - 5.11 (MIL/uL)   Hemoglobin 11.7 (*) 12.0 - 15.0 (g/dL)   HCT 13.0 (*) 86.5 - 46.0 (%)   MCV 91.9  78.0 - 100.0 (fL)   MCH 30.5  26.0 - 34.0 (pg)   MCHC 33.2  30.0 - 36.0 (g/dL)   RDW 78.4  69.6 - 29.5 (%)   Platelets 296  150 - 400 (K/uL)      No results found.   1. Gastroenteritis   2. Dehydration       MDM  Workup in the emergency department most consistent with probable viral gastroenteritis as an explanation for the vomiting and diarrheal illness has now resolved most likely dehydration resulting in the low blood pressure however the blood pressure of 75 systolic may have been missed no work as most of her blood pressures here have been systolics 120 bolus we've received has been in the E. Cinnamon repeat it would be up in the 120s patient able to ambulate in ED without any feeling of lightheadedness or feeling like she cannot pass out. Certainly improved with IV hydration 1 L here. Doubt significant hypotension, patient is very small and blood pressure is most likely normally 100-120 systolic. We are aware that she is treated for hypertension, she will followup with her primary care Dr. For consideration of adjustment of meds.   I personally performed the services described in this documentation, which was scribed in my  presence. The recorded information has been reviewed and considered.         Shelda Jakes, MD 03/17/11 2225

## 2011-03-17 NOTE — ED Notes (Signed)
Pt sent by Dr Loleta Chance for low BP, dehydration, and dizziness x 1 week.

## 2011-03-17 NOTE — ED Notes (Signed)
Pt states she has had some nausea and diarrhea. States she went to her doctor and was told to come here to be eval for low blood pressure. Pt states her blood pressure is up and down

## 2011-03-17 NOTE — Discharge Instructions (Signed)
B.R.A.T. Diet Your doctor has recommended the B.R.A.T. diet for you or your child until the condition improves. This is often used to help control diarrhea and vomiting symptoms. If you or your child can tolerate clear liquids, you may have:  Bananas.   Rice.   Applesauce.   Toast (and other simple starches such as crackers, potatoes, noodles).  Be sure to avoid dairy products, meats, and fatty foods until symptoms are better. Fruit juices such as apple, grape, and prune juice can make diarrhea worse. Avoid these. Continue this diet for 2 days or as instructed by your caregiver. Document Released: 01/16/2005 Document Revised: 09/28/2010 Document Reviewed: 07/05/2006 [facility] Patient Information 2012 [place], [place].  Return for new worse symptoms or recurrent vomiting.

## 2011-03-17 NOTE — ED Notes (Signed)
Pt ambulated at this time without any assistance needed no sob /or dizziness at this time .Grace Cross

## 2011-03-17 NOTE — ED Notes (Signed)
Patient denies nausea at this time and states she does not need Zofran

## 2012-01-23 ENCOUNTER — Emergency Department (HOSPITAL_COMMUNITY): Payer: BC Managed Care – PPO

## 2012-01-23 ENCOUNTER — Emergency Department (HOSPITAL_COMMUNITY)
Admission: EM | Admit: 2012-01-23 | Discharge: 2012-01-23 | Disposition: A | Discharge status: Home / Self Care | Payer: BC Managed Care – PPO | Attending: Emergency Medicine | Admitting: Emergency Medicine

## 2012-01-23 ENCOUNTER — Encounter (HOSPITAL_COMMUNITY): Payer: Self-pay | Admitting: *Deleted

## 2012-01-23 DIAGNOSIS — R0789 Other chest pain: Secondary | ICD-10-CM | POA: Insufficient documentation

## 2012-01-23 DIAGNOSIS — M79609 Pain in unspecified limb: Secondary | ICD-10-CM | POA: Insufficient documentation

## 2012-01-23 DIAGNOSIS — I10 Essential (primary) hypertension: Secondary | ICD-10-CM | POA: Insufficient documentation

## 2012-01-23 DIAGNOSIS — M542 Cervicalgia: Secondary | ICD-10-CM | POA: Insufficient documentation

## 2012-01-23 DIAGNOSIS — Z79899 Other long term (current) drug therapy: Secondary | ICD-10-CM | POA: Insufficient documentation

## 2012-01-23 DIAGNOSIS — R252 Cramp and spasm: Secondary | ICD-10-CM

## 2012-01-23 DIAGNOSIS — K219 Gastro-esophageal reflux disease without esophagitis: Secondary | ICD-10-CM | POA: Insufficient documentation

## 2012-01-23 DIAGNOSIS — J45909 Unspecified asthma, uncomplicated: Secondary | ICD-10-CM | POA: Insufficient documentation

## 2012-01-23 DIAGNOSIS — R1011 Right upper quadrant pain: Secondary | ICD-10-CM | POA: Insufficient documentation

## 2012-01-23 LAB — BASIC METABOLIC PANEL
BUN: 17 mg/dL (ref 6–23)
CO2: 31 mEq/L (ref 19–32)
Chloride: 97 mEq/L (ref 96–112)
Creatinine, Ser: 1.14 mg/dL — ABNORMAL HIGH (ref 0.50–1.10)
GFR calc Af Amer: 61 mL/min — ABNORMAL LOW (ref >90)
Potassium: 4.1 mEq/L (ref 3.5–5.1)

## 2012-01-23 LAB — CBC WITH DIFFERENTIAL/PLATELET
Basophils Absolute: 0 10*3/uL (ref 0.0–0.1)
HCT: 35.4 % — ABNORMAL LOW (ref 36.0–46.0)
Hemoglobin: 11.5 g/dL — ABNORMAL LOW (ref 12.0–15.0)
Lymphocytes Relative: 26 % (ref 12–46)
Monocytes Absolute: 0.5 10*3/uL (ref 0.1–1.0)
Monocytes Relative: 13 % — ABNORMAL HIGH (ref 3–12)
Neutro Abs: 2.3 10*3/uL (ref 1.7–7.7)
Neutrophils Relative %: 60 % (ref 43–77)
WBC: 3.8 10*3/uL — ABNORMAL LOW (ref 4.0–10.5)

## 2012-01-23 LAB — TROPONIN I: Troponin I: <0.3 ng/mL (ref <0.30)

## 2012-01-23 NOTE — ED Notes (Signed)
Discharge instructions given and reviewed with patient.  Patient verbalized understanding to follow up with PMD as needed.  Patient ambulatory with steady gait; discharged home in good condition. 

## 2012-01-23 NOTE — ED Notes (Signed)
"  cramp" rt ant chest and RUQ, since yesterday and cramps in hands and lt shoulder.  No NVD, NO fever.  No cough

## 2012-01-23 NOTE — ED Provider Notes (Signed)
History   This chart was scribed for Donnetta Hutching, MD by Toya Smothers, ED Scribe. The patient was seen in room APA15/APA15. Patient's care was started at 1530.  CSN: [id number]  Arrival date & time 01/23/12  1530   First MD Initiated Contact with Patient 01/23/12 1543      No chief complaint on file.   HPI  Grace Cross is a 56 y.o. female seen by Dr. Loleta Chance, who presents to the Emergency Department complaining of 1 day of new, sudden onset, intermittent, moderate hand and neck pain. Pain is described as a "sharp tightening," which occurs twice a day in 30 minuet intervals. Pt also c/o 1 week of new, unchanged, mild right chest pain as the result of a direct blow injury. Pain is described as a "cramping soreness." No alleviators or aggravators denoted. Symptoms have not been treated PTA. No diaphoresis, fever, weakness, syncope, chills, cough, congestion, or n/v/d. Pt denies use of tobacco products, consumption of alcohol, and use of illicit drugs. Medical Hx includes HTN, Asthma, and Acid Reflux. No pertinent surgical Hx denoted.    Past Medical History  Diagnosis Date  . Hypertension   . Asthma   . Acid reflux     History reviewed. No pertinent past surgical history.  History reviewed. No pertinent family history.  History  Substance Use Topics  . Smoking status: Never Smoker   . Smokeless tobacco: Not on file  . Alcohol Use: No   Review of Systems  Constitutional: Negative for fever.  Respiratory: Negative for cough.   Cardiovascular: Positive for chest pain.  Musculoskeletal: Positive for arthralgias.  All other systems reviewed and are negative.    Allergies  Penicillins  Home Medications   Current Outpatient Rx  Name  Route  Sig  Dispense  Refill  . ALBUTEROL SULFATE HFA 108 (90 BASE) MCG/ACT IN AERS   Inhalation   Inhale 2 puffs into the lungs every 6 (six) hours as needed. For shortness of breath         . ESCITALOPRAM OXALATE 10 MG PO TABS   Oral   Take  10 mg by mouth daily.         Marland Kitchen FLUTICASONE-SALMETEROL 100-50 MCG/DOSE IN AEPB   Inhalation   Inhale 1 puff into the lungs every 12 (twelve) hours.         . IBUPROFEN 600 MG PO TABS   Oral   Take 600 mg by mouth 4 (four) times daily as needed. For pain         . METHOCARBAMOL 750 MG PO TABS   Oral   Take 1,500 mg by mouth 4 (four) times daily.         . CENTRUM SILVER PO   Oral   Take 1 tablet by mouth daily.         Marland Kitchen OMEPRAZOLE 20 MG PO CPDR   Oral   Take 20 mg by mouth daily. For acid reflux           BP 103/81  Pulse 79  Temp 97.5 F (36.4 C) (Oral)  Resp 20  Ht 5\' 1"  (1.549 m)  Wt 103 lb (46.72 kg)  BMI 19.46 kg/m2  SpO2 98%  Physical Exam  Nursing note and vitals reviewed. Constitutional: She is oriented to person, place, and time. She appears well-developed and well-nourished.  HENT:  Head: Normocephalic and atraumatic.  Eyes: Conjunctivae normal and EOM are normal. Pupils are equal, round, and reactive to light.  Neck: Normal range of motion. Neck supple.  Cardiovascular: Normal rate, regular rhythm and normal heart sounds.   Pulmonary/Chest: Effort normal and breath sounds normal.       Slightly tend over right anterior chest.  Abdominal: Soft. Bowel sounds are normal.  Musculoskeletal: Normal range of motion.  Neurological: She is alert and oriented to person, place, and time.  Skin: Skin is warm and dry.  Psychiatric: She has a normal mood and affect.    ED Course  Procedures DIAGNOSTIC STUDIES: Oxygen Saturation is 98% on room air, normal by my interpretation.    COORDINATION OF CARE: 15:43- Ordered ED EKG. 16:38- Evaluated Pt. Pt is awake, alert, and without distress. 16:45- Patient informed of clinical course, understand medical decision-making process, and agree with plan. Will continue to monitor. 16:45- Ordered DG Chest 2 View 1 time imaging. 16:45- Ordered Basic metabolic panel, CBC with Differential, and Urinalysis, Routine  w reflex microscopic. 18:46- Rechecked Pt. Pt is feeling improved after treatment and observation Discussed findings.   Labs Reviewed  CBC WITH DIFFERENTIAL - Abnormal; Notable for the following:    WBC 3.8 (*)     RBC 3.80 (*)     Hemoglobin 11.5 (*)     HCT 35.4 (*)     Monocytes Relative 13 (*)     All other components within normal limits  BASIC METABOLIC PANEL - Abnormal; Notable for the following:    Sodium 133 (*)     Glucose, Bld 130 (*)     Creatinine, Ser 1.14 (*)     GFR calc non Af Amer 53 (*)     GFR calc Af Amer 61 (*)     All other components within normal limits  TROPONIN I   No results found.   No diagnosis found.   Date: 01/23/2012  Rate: 88  Rhythm: normal sinus rhythm  QRS Axis: normal  Intervals: normal  ST/T Wave abnormalities: normal  Conduction Disutrbances: none  Narrative Interpretation: unremarkable LVH    MDM  Normal physical exam. Screening tests negative. Patient is nontoxic      I personally performed the services described in this documentation, which was scribed in my presence. The recorded information has been reviewed and is accurate.    Donnetta Hutching, MD 01/23/12 [phone]

## 2012-05-16 ENCOUNTER — Other Ambulatory Visit (HOSPITAL_COMMUNITY): Payer: Self-pay | Admitting: Family Medicine

## 2012-05-16 DIAGNOSIS — R1310 Dysphagia, unspecified: Secondary | ICD-10-CM

## 2012-05-22 ENCOUNTER — Other Ambulatory Visit (HOSPITAL_COMMUNITY): Payer: BC Managed Care – PPO

## 2012-05-30 ENCOUNTER — Ambulatory Visit: Payer: BC Managed Care – PPO | Admitting: Gastroenterology

## 2012-06-11 ENCOUNTER — Ambulatory Visit: Payer: BC Managed Care – PPO | Admitting: Gastroenterology

## 2012-06-12 ENCOUNTER — Ambulatory Visit (INDEPENDENT_AMBULATORY_CARE_PROVIDER_SITE_OTHER): Payer: BC Managed Care – PPO | Admitting: Gastroenterology

## 2012-06-12 ENCOUNTER — Encounter: Payer: Self-pay | Admitting: Gastroenterology

## 2012-06-12 VITALS — BP 101/68 | HR 66 | Temp 97.4°F | Ht 61.0 in | Wt 105.0 lb

## 2012-06-12 DIAGNOSIS — R194 Change in bowel habit: Secondary | ICD-10-CM

## 2012-06-12 DIAGNOSIS — R634 Abnormal weight loss: Secondary | ICD-10-CM | POA: Insufficient documentation

## 2012-06-12 DIAGNOSIS — R1314 Dysphagia, pharyngoesophageal phase: Secondary | ICD-10-CM

## 2012-06-12 DIAGNOSIS — R198 Other specified symptoms and signs involving the digestive system and abdomen: Secondary | ICD-10-CM

## 2012-06-12 DIAGNOSIS — R1310 Dysphagia, unspecified: Secondary | ICD-10-CM | POA: Insufficient documentation

## 2012-06-12 DIAGNOSIS — R1319 Other dysphagia: Secondary | ICD-10-CM

## 2012-06-12 DIAGNOSIS — R1030 Lower abdominal pain, unspecified: Secondary | ICD-10-CM | POA: Insufficient documentation

## 2012-06-12 DIAGNOSIS — R109 Unspecified abdominal pain: Secondary | ICD-10-CM

## 2012-06-12 DIAGNOSIS — K589 Irritable bowel syndrome without diarrhea: Secondary | ICD-10-CM | POA: Insufficient documentation

## 2012-06-12 MED ORDER — PEG 3350-KCL-NA BICARB-NACL 420 G PO SOLR: 4000.0000 mL | ORAL | Status: DC

## 2012-06-12 NOTE — Patient Instructions (Addendum)
1. Colonoscopy and upper endoscopy with esophageal dilation by Dr. Darrick Penna. Please see separate instruction. 2. Please maintain a soft diet until your endoscopy. Keep plenty of fluid available to help wash down pills and food. 3. Continue omeprazole for now.

## 2012-06-12 NOTE — Assessment & Plan Note (Signed)
One month history of esophageal dysphagia, gradually worsening. Now present with solids, liquids, pills. Mild associated weight loss due to decrease po intake. Needs EGD/ED in near future to look for esophageal web, ring, stricture, malignancy.  I have discussed the risks, alternatives, benefits with regards to but not limited to the risk of reaction to medication, bleeding, infection, perforation and the patient is agreeable to proceed. Written consent to be obtained.  Soft mechanical diet for now. Continue omeprazole.

## 2012-06-12 NOTE — Progress Notes (Signed)
Primary Care Physician:  Evlyn Courier, MD  Primary Gastroenterologist:  Jonette Eva, MD   Chief Complaint  Patient presents with  . Dysphagia    HPI:  Grace Cross is a 57 y.o. female here as a self-referral for one month of dysphagia to solids, liquids, pills. She presents with her daughter today. Patient complains of approximately one month history of difficulty swallowing. At times she has to bring food back up. No pain associated with it. No nausea. Appetite good. Weight down 3 pounds the last one month due to difficulty swallowing. Change in bowel habits in the last 6 months. Complains of postprandial loose stools. Some nocturnal fecal incontinence. ?labs/stool test done by Dr. Mirna Mires when symptoms first began. No melena, brbpr. Abdominal soreness after BMs. Weakness associated with it. Imodium two daily helps some. Heartburn not always controlled with omeprazole.  Remote history of hyperthyroidism treated with medication at the time. No longer requiring medication.   Current Outpatient Prescriptions  Medication Sig Dispense Refill  . albuterol (PROAIR HFA) 108 (90 BASE) MCG/ACT inhaler Inhale 2 puffs into the lungs every 6 (six) hours as needed. For shortness of breath      . escitalopram (LEXAPRO) 10 MG tablet Take 10 mg by mouth daily.      . fexofenadine (ALLEGRA) 180 MG tablet Take 180 mg by mouth daily.      . Fluticasone-Salmeterol (ADVAIR) 100-50 MCG/DOSE AEPB Inhale 1 puff into the lungs every 12 (twelve) hours.      . Multiple Vitamin (MULTIVITAMIN) capsule Take 1 capsule by mouth daily.      . nebivolol (BYSTOLIC) 10 MG tablet Take 10 mg by mouth daily.      Marland Kitchen omeprazole (PRILOSEC) 20 MG capsule Take 20 mg by mouth daily. For acid reflux       No current facility-administered medications for this visit.    Allergies as of 06/12/2012 - Review Complete 06/12/2012  Allergen Reaction Noted  . Penicillins Rash 03/17/2011    Past Medical History  Diagnosis Date  .  Hypertension   . Asthma   . Acid reflux   . Cardiac arrhythmia     Past Surgical History  Procedure Laterality Date  . None      Family History  Problem Relation Age of Onset  . Colon cancer Neg Hx   . Inflammatory bowel disease Neg Hx     History   Social History  . Marital Status: Single    Spouse Name: N/A    Number of Children: 1  . Years of Education: N/A   Occupational History  . machine operator    Social History Main Topics  . Smoking status: Never Smoker   . Smokeless tobacco: Not on file  . Alcohol Use: No  . Drug Use: No  . Sexually Active:    Other Topics Concern  . Not on file   Social History Narrative  . No narrative on file      ROS:  General: Negative for anorexia,fever, chills, fatigue. See history of present illness Eyes: Negative for vision changes.  ENT: Negative for hoarseness, nasal congestion. Complains of dry mouth. CV: Negative for chest pain, angina, palpitations, dyspnea on exertion, peripheral edema.  Respiratory: Negative for dyspnea at rest, dyspnea on exertion, cough, sputum, wheezing.  GI: See history of present illness. GU:  Negative for dysuria, hematuria, urinary incontinence, urinary frequency, nocturnal urination.  MS: Negative for joint pain, low back pain.  Derm: Negative for rash or itching.  Neuro: Negative for weakness, abnormal sensation, seizure, frequent headaches, memory loss, confusion.  Psych: Negative for anxiety, depression, suicidal ideation, hallucinations.  Endo: Negative for unusual weight change.  Heme: Negative for bruising or bleeding. Allergy: Negative for rash or hives.    Physical Examination:  BP 101/68  Pulse 66  Temp(Src) 97.4 F (36.3 C) (Oral)  Ht 5\' 1"  (1.549 m)  Wt 105 lb (47.628 kg)  BMI 19.85 kg/m2   General: Well-nourished, well-developed in no acute distress. Accompanied by daughter Head: Normocephalic, atraumatic.   Eyes: Conjunctiva pink, no icterus. Mouth: Oropharyngeal  mucosa moist and pink , no lesions erythema or exudate. Neck: Supple without thyromegaly, masses, or lymphadenopathy.  Lungs: Clear to auscultation bilaterally.  Heart: Regular rate and rhythm, no murmurs rubs or gallops.  Abdomen: Bowel sounds are normal, mild left lower quadrant tenderness somewhat full but no appreciable mass.  nondistended, no hepatosplenomegaly or masses, no abdominal bruits or    hernia , no rebound or guarding.   Rectal: Deferred Extremities: No lower extremity edema. No clubbing or deformities.  Neuro: Alert and oriented x 4 , grossly normal neurologically.  Skin: Warm and dry, no rash or jaundice.   Psych: Alert and cooperative, normal mood and affect.  Labs: Lab Results  Component Value Date   WBC 3.8* 01/23/2012   HGB 11.5* 01/23/2012   HCT 35.4* 01/23/2012   MCV 93.2 01/23/2012   PLT 270 01/23/2012   Lab Results  Component Value Date   CREATININE 1.14* 01/23/2012   BUN 17 01/23/2012   NA 133* 01/23/2012   K 4.1 01/23/2012   CL 97 01/23/2012   CO2 31 01/23/2012     Imaging Studies: No results found.

## 2012-06-12 NOTE — Assessment & Plan Note (Addendum)
Six-month history of bowel change with postprandial loose stools, nocturnal fecal incontinence. No blood noted. Some associated lower abdominal discomfort. No prior colonoscopy. Recommend colonoscopy in the near future. Differential diagnosis includes IBD, malignancy, microscopic colitis. Doubt infectious colitis at this point. Consider small bowel and random colon biopsies.   Not noted previously, she had mild normocytic anemia in December 2013. She has had anemia dating back at least till 01/2009. Anemia panel most c/w anemia of chronic disease at that time. Hgb range from 10-11.7 over the past three years.  Await EGD and colonoscopy findings. Consider repeat CBC afterwards.

## 2012-06-12 NOTE — Progress Notes (Signed)
Cc PCP 

## 2012-06-13 ENCOUNTER — Encounter (HOSPITAL_COMMUNITY): Payer: Self-pay | Admitting: Pharmacy Technician

## 2012-06-17 ENCOUNTER — Ambulatory Visit (HOSPITAL_COMMUNITY)
Admission: RE | Admit: 2012-06-17 | Discharge: 2012-06-17 | Disposition: A | Discharge status: Home / Self Care | Payer: BC Managed Care – PPO | Source: Ambulatory Visit | Attending: Gastroenterology | Admitting: Gastroenterology

## 2012-06-17 ENCOUNTER — Encounter (HOSPITAL_COMMUNITY)
Admission: RE | Disposition: A | Discharge status: Home / Self Care | Payer: Self-pay | Source: Ambulatory Visit | Attending: Gastroenterology

## 2012-06-17 ENCOUNTER — Encounter (HOSPITAL_COMMUNITY): Payer: Self-pay

## 2012-06-17 DIAGNOSIS — R194 Change in bowel habit: Secondary | ICD-10-CM

## 2012-06-17 DIAGNOSIS — K2960 Other gastritis without bleeding: Secondary | ICD-10-CM | POA: Insufficient documentation

## 2012-06-17 DIAGNOSIS — K222 Esophageal obstruction: Secondary | ICD-10-CM | POA: Insufficient documentation

## 2012-06-17 DIAGNOSIS — K2990 Gastroduodenitis, unspecified, without bleeding: Secondary | ICD-10-CM

## 2012-06-17 DIAGNOSIS — R197 Diarrhea, unspecified: Secondary | ICD-10-CM

## 2012-06-17 DIAGNOSIS — K5730 Diverticulosis of large intestine without perforation or abscess without bleeding: Secondary | ICD-10-CM

## 2012-06-17 DIAGNOSIS — K319 Disease of stomach and duodenum, unspecified: Secondary | ICD-10-CM | POA: Insufficient documentation

## 2012-06-17 DIAGNOSIS — R1310 Dysphagia, unspecified: Secondary | ICD-10-CM | POA: Insufficient documentation

## 2012-06-17 DIAGNOSIS — I10 Essential (primary) hypertension: Secondary | ICD-10-CM | POA: Insufficient documentation

## 2012-06-17 DIAGNOSIS — K2970 Gastritis, unspecified, without bleeding: Secondary | ICD-10-CM

## 2012-06-17 DIAGNOSIS — R634 Abnormal weight loss: Secondary | ICD-10-CM | POA: Insufficient documentation

## 2012-06-17 DIAGNOSIS — R1319 Other dysphagia: Secondary | ICD-10-CM

## 2012-06-17 DIAGNOSIS — K648 Other hemorrhoids: Secondary | ICD-10-CM | POA: Insufficient documentation

## 2012-06-17 HISTORY — PX: COLONOSCOPY, ESOPHAGOGASTRODUODENOSCOPY (EGD) AND ESOPHAGEAL DILATION: SHX5781

## 2012-06-17 SURGERY — COLONOSCOPY, ESOPHAGOGASTRODUODENOSCOPY (EGD) AND ESOPHAGEAL DILATION (ED)
Anesthesia: Moderate Sedation

## 2012-06-17 MED ORDER — MIDAZOLAM HCL 5 MG/5ML IJ SOLN
INTRAMUSCULAR | Status: DC | PRN
  Administered 2012-06-17: 2 mg via INTRAVENOUS
  Administered 2012-06-17 (×2): 1 mg via INTRAVENOUS

## 2012-06-17 MED ORDER — MIDAZOLAM HCL 5 MG/5ML IJ SOLN
INTRAMUSCULAR | Status: AC
  Filled 2012-06-17: qty 10

## 2012-06-17 MED ORDER — MEPERIDINE HCL 100 MG/ML IJ SOLN
INTRAMUSCULAR | Status: DC | PRN
  Administered 2012-06-17 (×3): 25 mg via INTRAVENOUS

## 2012-06-17 MED ORDER — MINERAL OIL PO OIL
TOPICAL_OIL | ORAL | Status: AC
  Filled 2012-06-17: qty 30

## 2012-06-17 MED ORDER — NALOXONE HCL 0.4 MG/ML IJ SOLN
INTRAMUSCULAR | Status: AC
  Filled 2012-06-17: qty 1

## 2012-06-17 MED ORDER — HYDRALAZINE HCL 20 MG/ML IJ SOLN
10.0000 mg | Freq: Once | INTRAMUSCULAR | Status: AC
  Administered 2012-06-17: 10 mg via INTRAVENOUS

## 2012-06-17 MED ORDER — OMEPRAZOLE 20 MG PO CPDR: DELAYED_RELEASE_CAPSULE | ORAL | Status: DC

## 2012-06-17 MED ORDER — SODIUM CHLORIDE 0.9 % IV SOLN
INTRAVENOUS | Status: DC
  Administered 2012-06-17: 17:00:00 via INTRAVENOUS

## 2012-06-17 MED ORDER — NALOXONE HCL 0.4 MG/ML IJ SOLN
0.4000 mg | Freq: Once | INTRAMUSCULAR | Status: AC
  Administered 2012-06-17: 0.4 mg via INTRAVENOUS

## 2012-06-17 MED ORDER — MEPERIDINE HCL 100 MG/ML IJ SOLN
INTRAMUSCULAR | Status: AC
  Filled 2012-06-17: qty 2

## 2012-06-17 MED ORDER — HYDRALAZINE HCL 20 MG/ML IJ SOLN
INTRAMUSCULAR | Status: AC
  Filled 2012-06-17: qty 1

## 2012-06-17 MED ORDER — STERILE WATER FOR IRRIGATION IR SOLN
Status: DC | PRN
  Administered 2012-06-17: 14:00:00

## 2012-06-17 NOTE — Op Note (Addendum)
[facility] [address] [place], [address]   ENDOSCOPY PROCEDURE REPORT  PATIENT: Grace, Cross  MR#: [id number] BIRTHDATE: 1955/08/11 , 56  yrs. old GENDER: Female  ENDOSCOPIST: Jonette Eva, MD REFFERED BM:WUXLKG Hill, M.D.  PROCEDURE DATE:  06/17/2012 PROCEDURE:   EGD with biopsy and EGD with dilatation over guidewire   INDICATIONS:1.  dysphagia.   2.  weight loss. MEDICATIONS: TCS + Versed 1mg  IV TOPICAL ANESTHETIC: Cetacaine Spray  DESCRIPTION OF PROCEDURE:   After the risks benefits and alternatives of the procedure were thoroughly explained, informed consent was obtained.  The [id number] ([id number]) and [id number] ([id number])  endoscope was introduced through the mouth and advanced to the second portion of the duodenum. The instrument was slowly withdrawn as the mucosa was carefully examined.  Prior to withdrawal of the scope, the guidwire was placed.  The esophagus was dilated successfully.  The patient was recovered in endoscopy and discharged home in satisfactory condition.   ESOPHAGUS: A moderately severe Schatzki ring was found at the gastroesophageal junction.   STOMACH: Moderate non-erosive gastritis (inflammation) with hemorrhage was found in the gastric antrum.  Multiple biopsies were performed using cold forceps. DUODENUM: The duodenal mucosa showed no abnormalities in the bulb and second portion of the duodenum.  Dilation was then performed at the gastroesphageal junction Dilator: Savary over guidewire Size(s): 15-16 mm Resistance: moderate Heme: yes  COMPLICATIONS: PT RETURNED TO POSTOP WITH SBP 150s. SUBSEQUENT BP 70-90. NARCAN 0.4 MG IV GIVEN IN POSTOP.  ENDOSCOPIC IMPRESSION: 1.   Schatzki ring  at the gastroesophageal junction 2.   MODERATER Non-erosive gastritis  RECOMMENDATIONS: Do not take blood pressure pills TODAY. CONTINUE OMEPRAZOLE.  TAKE 30 MINUTES PRIOR TO YOUR MEALS TWICE DAILY. AVOID ITEMS THAT TRIGGER  GASTRITIS. FOLLOW A HIGH FIBER/LOW FAT DIET.  AVOID ITEMS THAT CAUSE BLOATING.  BIOPSY WILL BE BACK IN 7 DAYS. OPV IN 6 WEEKS WITH DR.  Jj Enyeart.  IF DYSPHAGIA PERSISTS, PT WILL NEED A BPE.      _______________________________ Rosalie DoctorJonette Eva, MD 06/21/2012 11:55 AM Revised: 06/21/2012 11:55 AM     PATIENT NAME:  Grace, Cross MR#: [id number]

## 2012-06-17 NOTE — H&P (Signed)
  Primary Care Physician:  Evlyn Courier, MD Primary Gastroenterologist:  Dr. Darrick Penna  Pre-Procedure History & Physical: HPI:  Grace Cross is a 57 y.o. female here for  Anorexia/weight loss/DYSPHAGIA/changeinbowel habits  Past Medical History  Diagnosis Date  . Hypertension   . Asthma   . Acid reflux   . Cardiac arrhythmia     Past Surgical History  Procedure Laterality Date  . None      Prior to Admission medications   Medication Sig Start Date End Date Taking? Authorizing Provider  albuterol (PROAIR HFA) 108 (90 BASE) MCG/ACT inhaler Inhale 2 puffs into the lungs every 6 (six) hours as needed. For shortness of breath   Yes Historical Provider, MD  escitalopram (LEXAPRO) 10 MG tablet Take 10 mg by mouth daily.   Yes Historical Provider, MD  fexofenadine (ALLEGRA) 180 MG tablet Take 180 mg by mouth daily.   Yes Historical Provider, MD  Fluticasone-Salmeterol (ADVAIR) 100-50 MCG/DOSE AEPB Inhale 1 puff into the lungs every 12 (twelve) hours.   Yes Historical Provider, MD  Multiple Vitamin (MULTIVITAMIN WITH MINERALS) TABS Take 1 tablet by mouth daily.   Yes Historical Provider, MD  Multiple Vitamin (MULTIVITAMIN) capsule Take 1 capsule by mouth daily.   Yes Historical Provider, MD  nebivolol (BYSTOLIC) 10 MG tablet Take 10 mg by mouth daily.   Yes Historical Provider, MD  omeprazole (PRILOSEC) 20 MG capsule Take 20 mg by mouth daily. For acid reflux   Yes Historical Provider, MD  polyethylene glycol-electrolytes (TRILYTE) 420 G solution Take 4,000 mLs by mouth as directed. 06/12/12  Yes [place] Bali, MD    Allergies as of 06/12/2012 - Review Complete 06/12/2012  Allergen Reaction Noted  . Penicillins Rash 03/17/2011    Family History  Problem Relation Age of Onset  . Colon cancer Neg Hx   . Inflammatory bowel disease Neg Hx     History   Social History  . Marital Status: Single    Spouse Name: N/A    Number of Children: 1  . Years of Education: N/A   Occupational  History  . machine operator    Social History Main Topics  . Smoking status: Never Smoker   . Smokeless tobacco: Not on file  . Alcohol Use: No  . Drug Use: No  . Sexually Active: Not on file   Other Topics Concern  . Not on file   Social History Narrative  . No narrative on file    Review of Systems: See HPI, otherwise negative ROS   Physical Exam: BP 153/112  Pulse 82  Temp(Src) 97.6 F (36.4 C) (Oral)  SpO2 95% General:   Alert,  pleasant and cooperative in NAD Head:  Normocephalic and atraumatic. Neck:  Supple; Lungs:  Clear throughout to auscultation.    Heart:  Regular rate and rhythm. Abdomen:  Soft, nontender and nondistended. Normal bowel sounds, without guarding, and without rebound.   Neurologic:  Alert and  oriented x4;  grossly normal neurologically.  Impression/Plan:    Weight loss/dysphagia/change in bowel habits  PLAN:  TCS/EGD/dil TODAY

## 2012-06-17 NOTE — Op Note (Addendum)
[facility] [address] [place], [address]   COLONOSCOPY PROCEDURE REPORT  PATIENT: Grace, Cross  MR#: [id number] BIRTHDATE: [birth year] , 56  yrs. old GENDER: Female ENDOSCOPIST: Jonette Eva, MD REFERRED AO:ZHYQMV Hill, M.D. PROCEDURE DATE:  06/17/2012 PROCEDURE:   Colonoscopy with biopsy INDICATIONS:UNEXPLAINED WEIGHT LOSS.  INTERMITTENT DIARRHEA. DID NOT TAKE BP MEDS TODAY. MEDICATIONS: Versed 3 mg IV and Demerol 50 mg IV, HYDRALAZINE 10 MG IV  DESCRIPTION OF PROCEDURE:    Physical exam was performed.  Informed consent was obtained from the patient after explaining the benefits, risks, and alternatives to procedure.  The patient was connected to monitor and placed in left lateral position. Continuous oxygen was provided by nasal cannula and IV medicine administered through an indwelling cannula.  After administration of sedation and rectal exam, the patients rectum was intubated and the EC-[id number] ([id number])  colonoscope was advanced under direct visualization to the ileum.  The scope was removed slowly by carefully examining the color, texture, anatomy, and integrity mucosa on the way out.  The patient was recovered in endoscopy and discharged home in satisfactory condition.    COLON FINDINGS: The mucosa appeared normal in the terminal ileum. RARE DIVERTICULA IN THE RIGHT COLON. , Moderate sized internal hemorrhoids were found.  NO POLYPS OR INFLAMMATION SEEN. RANDOM COLD FORCEPS BIOPSIES OBTAINED TO EVALUATE FOR MICROSCOPI COLITIS.   PREP QUALITY: good.   CECAL W/D TIME: 14 minutes     COMPLICATIONS: PT INITIALLY HHAD DBP 117. GIVEN HYDRALAZINE 10 MG IV. SBP 70-80. PT ABLE TO ANSWER COMMANDS.  ENDOSCOPIC IMPRESSION: 1.   Mild diverticulosis IN CECUM 3.   Moderate sized internal hemorrhoids  RECOMMENDATIONS:  Do not take blood pressure pills TODAY. FOLLOW A HIGH FIBER/LOW FAT DIET. AVOID ITEMS THAT CAUSE BLOATING. BIOPSY WILL BE BACK IN 7  DAYS. Next colonoscopy in 10 years.        _______________________________ Rosalie DoctorJonette Eva, MD 06/21/2012 11:54 AM Revised: 06/21/2012 11:54 AM

## 2012-06-17 NOTE — Progress Notes (Signed)
1523 Gave report to [facility] at [place] not 1537 as documented. Could not change time.

## 2012-06-17 NOTE — Progress Notes (Signed)
Narcan 0.4mg  IV given @ 1640 for low BP per MD order.  Pt. Had total of of IV fluids & denies any pain or discomfort.  Pt. Walked halls & denies any dizziness or any feeling of lightheadedness. MD states that OK with DC if systolic BP above 90.  Will continue to montior pt.

## 2012-06-20 ENCOUNTER — Encounter (HOSPITAL_COMMUNITY): Payer: Self-pay | Admitting: Gastroenterology

## 2012-06-27 ENCOUNTER — Telehealth: Payer: Self-pay | Admitting: Gastroenterology

## 2012-06-27 NOTE — Telephone Encounter (Signed)
Cc PCP 

## 2012-06-27 NOTE — Telephone Encounter (Signed)
Please call pt. HER stomach Bx shows gastritis.  Her colon Bx are normal.    CONTINUE OMEPRAZOLE.  TAKE 30 MINUTES PRIOR TO YOUR MEALS TWICE DAILY.  AVOID ITEMS THAT TRIGGER GASTRITIS.   FOLLOW A HIGH FIBER/LOW FAT DIET. AVOID ITEMS THAT CAUSE BLOATING.   OPV IN 6 WEEKS DX: DYSPHAGIA  Next colonoscopy in 10 years.

## 2012-06-27 NOTE — Telephone Encounter (Signed)
L/M to call.

## 2012-07-01 NOTE — Telephone Encounter (Signed)
Pt returned call and was informed of results.  

## 2012-07-02 NOTE — Telephone Encounter (Signed)
Reminder in epic to repeat tcs in 10 years and OV on 7/17 at 0945 with [facility]

## 2012-08-13 ENCOUNTER — Encounter: Payer: Self-pay | Admitting: Gastroenterology

## 2012-08-15 ENCOUNTER — Encounter: Payer: Self-pay | Admitting: Gastroenterology

## 2012-08-15 ENCOUNTER — Ambulatory Visit (INDEPENDENT_AMBULATORY_CARE_PROVIDER_SITE_OTHER): Payer: BC Managed Care – PPO | Admitting: Gastroenterology

## 2012-08-15 ENCOUNTER — Ambulatory Visit (HOSPITAL_COMMUNITY)
Admission: RE | Admit: 2012-08-15 | Discharge: 2012-08-15 | Disposition: A | Discharge status: Home / Self Care | Payer: BC Managed Care – PPO | Source: Ambulatory Visit | Attending: Gastroenterology | Admitting: Gastroenterology

## 2012-08-15 VITALS — BP 94/68 | HR 95 | Temp 97.2°F | Ht 61.0 in | Wt 104.6 lb

## 2012-08-15 DIAGNOSIS — R1319 Other dysphagia: Secondary | ICD-10-CM

## 2012-08-15 DIAGNOSIS — K110 Atrophy of salivary gland: Secondary | ICD-10-CM | POA: Insufficient documentation

## 2012-08-15 DIAGNOSIS — R220 Localized swelling, mass and lump, head: Secondary | ICD-10-CM | POA: Insufficient documentation

## 2012-08-15 DIAGNOSIS — J438 Other emphysema: Secondary | ICD-10-CM | POA: Insufficient documentation

## 2012-08-15 DIAGNOSIS — I10 Essential (primary) hypertension: Secondary | ICD-10-CM | POA: Insufficient documentation

## 2012-08-15 DIAGNOSIS — R1310 Dysphagia, unspecified: Secondary | ICD-10-CM | POA: Insufficient documentation

## 2012-08-15 DIAGNOSIS — R1314 Dysphagia, pharyngoesophageal phase: Secondary | ICD-10-CM

## 2012-08-15 MED ORDER — IOHEXOL 300 MG/ML  SOLN
75.0000 mL | Freq: Once | INTRAMUSCULAR | Status: AC | PRN
  Administered 2012-08-15: 75 mL via INTRAVENOUS

## 2012-08-15 NOTE — Patient Instructions (Signed)
CONTINUE OMEPRAZOLE.  TAKE 30 MINUTES PRIOR TO YOUR MEALS TWICE DAILY.  FOLLOW A SOFT MECHANICAL DIET. SEE INFO BELOW.  MEATS SHOULD BE CHOPPED OR GROUND ONLY.  GET THE CT OF NECK TODAY.  GET THE SWALLOWING STUDY NEXT WEEK.  FOLLOW UP IN 3 MOS.   SOFT MECHANICAL DIET This SOFT MECHANICAL DIET is restricted to:  Foods that are moist, soft-textured, and easy to chew and swallow.   Meats that are ground or are minced no larger than one-quarter inch pieces. Meats are moist with gravy or sauce added.   Foods that do not include bread or bread-like textures except soft pancakes, well-moistened with syrup or sauce.   Textures with some chewing ability required.   Casseroles without rice.   Cooked vegetables that are less than half an inch in size and easily mashed with a fork. No cooked corn, peas, broccoli, cauliflower, cabbage, Brussels sprouts, asparagus, or other fibrous, non-tender or rubbery cooked vegetables.   Canned fruit except for pineapple. Fruit must be cut into pieces no larger than half an inch in size.   Foods that do not include nuts, seeds, coconut, or sticky textures.   FOOD TEXTURES FOR DYSPHAGIA DIET LEVEL 2 -SOFT MECHANICAL DIET (includes all foods on Dysphagia Diet Level 1 - Pureed, in addition to the foods listed below)  FOOD GROUP: Breads. RECOMMENDED: Soft pancakes, well-moistened with syrup or sauce.  AVOID: All others.  FOOD GROUP: Cereals.  RECOMMENDED: Cooked cereals with little texture, including oatmeal. Unprocessed wheat bran stirred into cereals for bulk. Note: If thin liquids are restricted, it is important that all of the liquid is absorbed into the cereal.  AVOID: All dry cereals and any cooked cereals that may contain flax seeds or other seeds or nuts. Whole-grain, dry, or coarse cereals. Cereals with nuts, seeds, dried fruit, and/or coconut.  FOOD GROUP: Desserts. RECOMMENDED: Pudding, custard. Soft fruit pies with bottom crust only. Canned  fruit (excluding pineapple). Soft, moist cakes with icing.Frozen malts, milk shakes, frozen yogurt, eggnog, nutritional supplements, ice cream, sherbet, regular or sugar-free gelatin, or any foods that become thin liquid at either room (70 F) or body temperature (98 F).  AVOID: Dry, coarse cakes and cookies. Anything with nuts, seeds, coconut, pineapple, or dried fruit. Breakfast yogurt with nuts. Rice or bread pudding.  FOOD GROUP: Fats. RECOMMENDED: Butter, margarine, cream for cereal (depending on liquid consistency recommendations), gravy, cream sauces, sour cream, sour cream dips with soft additives, mayonnaise, salad dressings, cream cheese, cream cheese spreads with soft additives, whipped toppings.  AVOID: All fats with coarse or chunky additives.  FOOD GROUP: Fruits. RECOMMENDED: Soft drained, canned, or cooked fruits without seeds or skin. Fresh soft and ripe banana. Fruit juices with a small amount of pulp. If thin liquids are restricted, fruit juices should be thickened to appropriate consistency.  AVOID: Fresh or frozen fruits. Cooked fruit with skin or seeds. Dried fruits. Fresh, canned, or cooked pineapple.  FOOD GROUP: Meats and Meat Substitutes. (Meat pieces should not exceed 1/4 of an inch cube and should be tender.) RECOMMENDED: Moistened ground or cooked meat, poultry, or fish. Moist ground or tender meat may be served with gravy or sauce. Casseroles without rice. Moist macaroni and cheese, well-cooked pasta with meat sauce, tuna noodle casserole, soft, moist lasagna. Moist meatballs, meatloaf, or fish loaf. Protein salads, such as tuna or egg without large chunks, celery, or onion. Cottage cheese, smooth quiche without large chunks. Poached, scrambled, or soft-cooked eggs (egg yolks should not be "  runny" but should be moist and able to be mashed with butter, margarine, or other moisture added to them). (Cook eggs to 160 F or use pasteurized eggs for safety.) Souffls may  have small, soft chunks. Tofu. Well-cooked, slightly mashed, moist legumes, such as baked beans. All meats or protein substitutes should be served with sauces or moistened to help maintain cohesiveness in the oral cavity.  AVOID: Dry meats, tough meats (such as bacon, sausage, hot dogs, bratwurst). Dry casseroles or casseroles with rice or large chunks. Peanut butter. Cheese slices and cubes. Hard-cooked or crisp fried eggs. Sandwiches.Pizza.  FOOD GROUP: Potatoes and Starches. RECOMMENDED: Well-cooked, moistened, boiled, baked, or mashed potatoes. Well-cooked shredded hash [place] potatoes that are not crisp. (All potatoes need to be moist and in sauces.)Well-cooked noodles in sauce. Spaetzel or soft dumplings that have been moistened with butter or gravy.  AVOID: Potato skins and chips. Fried or French-fried potatoes. Rice.  FOOD GROUP: Soups. RECOMMENDED: Soups with easy-to-chew or easy-to-swallow meats or vegetables: Particle sizes in soups should be less than 1/2 inch. Soups will need to be thickened to appropriate consistency if soup is thinner than prescribed liquid consistency.  AVOID: Soups with large chunks of meat and vegetables. Soups with rice, corn, peas.  FOOD GROUP: Vegetables. RECOMMENDED: All soft, well-cooked vegetables. Vegetables should be less than a half inch. Should be easily mashed with a fork.  AVOID: Cooked corn and peas. Broccoli, cabbage, Brussels sprouts, asparagus, or other fibrous, non-tender or rubbery cooked vegetables.  FOOD GROUP: Miscellaneous. RECOMMENDED: Jams and preserves without seeds, jelly. Sauces, salsas, etc., that may have small tender chunks less than 1/2 inch. Soft, smooth chocolate bars that are easily chewed.  AVOID: Seeds, nuts, coconut, or sticky foods. Chewy candies such as caramels or licorice.

## 2012-08-15 NOTE — Progress Notes (Signed)
CC PCP 

## 2012-08-15 NOTE — Assessment & Plan Note (Signed)
CONTINUE OMEPRAZOLE.  TAKE 30 MINUTES PRIOR TO YOUR MEALS TWICE DAILY.  FOLLOW A SOFT MECHANICAL DIET. SEE INFO BELOW.  MEATS SHOULD BE CHOPPED OR GROUND ONLY.  GET THE CT OF NECK TODAY.  GET THE SWALLOWING STUDY NEXT WEEK.  FOLLOW UP IN 3 MOS.

## 2012-08-15 NOTE — Assessment & Plan Note (Addendum)
IN PT WITH HX: ? GRAVES DISEASE. TOBACCO ABUSE. ASSOCIATED WITH LYMPHADENOPATHY & WEIGHT LOSS.  CT OF NECK TODAY TO EVALUATE FOR THYROID MASS,, HEAD AND NECK CA, OR LYMPHOMA. OPV IN 3 MOS

## 2012-08-15 NOTE — Addendum Note (Signed)
Addended by: Jennings Books on: 08/15/2012 10:38 AM   Modules accepted: Orders

## 2012-08-15 NOTE — Progress Notes (Addendum)
  Subjective:    Patient ID: Grace Cross, female    DOB: 02/08/1955, 57 y.o.   MRN: [id number]  Evlyn Courier, MD  HPI CONCERNED ABOUT SWOLLEN GLANDS ON NECK. SUBJECTIVE CHILLS. SWALLOWING: HURTS WHEN SHE SWALLOWS. PMHx: GRAVES DISEASE?. ALSO FORMER SMOKER: QUIT 6 YEARS AGO(1 PK/DAY FOR 18 YEARS). RARE ETOH. NEVER CONSUMED ETOH EVERY WEEKEND OR EVERY DAY. LOST 0.5 LBS SINCE MAY 2014.  PT DENIES FEVER, CHILLS, BRBPR, nausea, vomiting, melena, diarrhea, constipation, abd pain, problems with sedation, heartburn or indigestion.  Past Medical History  Diagnosis Date  . Hypertension   . Asthma   . Acid reflux   . Cardiac arrhythmia     Past Surgical History  Procedure Laterality Date  . None    . Colonoscopy, esophagogastroduodenoscopy (egd) and esophageal dilation N/A 06/17/2012    NGE:XBMW diverticulosis IN CECUM/Moderate sized internal hemorrhoids;FOUR COLONPOLYSP REMOVED/Mild diverticulosis was noted at the hepatic flexure/Moderate sized internal hemorrhoids/Schatzki ring  at the gastroesophageal junction/MODERATER Non-erosive gastritis   Allergies  Allergen Reactions  . Penicillins Rash   Current Outpatient Prescriptions  Medication Sig Dispense Refill  . albuterol (PROAIR HFA) 108 (90 BASE) MCG/ACT inhaler Inhale 2 puffs into the lungs every 6 (six) hours as needed. For shortness of breath      . escitalopram (LEXAPRO) 10 MG tablet Take 10 mg by mouth daily.      . fexofenadine (ALLEGRA) 180 MG tablet Take 180 mg by mouth daily.      . Fluticasone-Salmeterol (ADVAIR) 100-50 MCG/DOSE AEPB Inhale 1 puff into the lungs every 12 (twelve) hours.      . Multiple Vitamin (MULTIVITAMIN) capsule Take 1 capsule by mouth daily.      . nebivolol (BYSTOLIC) 10 MG tablet Take 10 mg by mouth daily.      Marland Kitchen omeprazole (PRILOSEC) 20 MG capsule 1 po 30 mins prior to meals bid         Review of Systems     Objective:   Physical Exam  Vitals reviewed. Constitutional: She is oriented to  person, place, and time. She appears well-nourished. No distress.  HENT:  Head: Normocephalic and atraumatic.  Mouth/Throat: Oropharynx is clear and moist. No oropharyngeal exudate.  EDENTULOUS, NO MASS OR LESIONS APPRECIATED UNDER TONGUE.  Eyes: Pupils are equal, round, and reactive to light. No scleral icterus.  Neck: Normal range of motion. Neck supple.  Cardiovascular: Normal rate, regular rhythm and normal heart sounds.   Pulmonary/Chest: Effort normal and breath sounds normal. No respiratory distress.  Abdominal: Soft. Bowel sounds are normal. She exhibits no distension. There is no tenderness.  Musculoskeletal: Normal range of motion. She exhibits no edema.  Lymphadenopathy:    She has cervical adenopathy (MILD PROMINENT SUBMANDIBULAR NODES BILATERALLY).  Neurological: She is alert and oriented to person, place, and time.  NO FOCAL DEFICITS   Psychiatric: She has a normal mood and affect.          Assessment & Plan:

## 2012-08-20 LAB — BASIC METABOLIC PANEL WITH GFR
BUN: 28 mg/dL — ABNORMAL HIGH (ref 6–23)
CO2: 31 meq/L (ref 19–32)
Calcium: 10.3 mg/dL (ref 8.4–10.5)
Chloride: 99 meq/L (ref 96–112)
Creat: 1.13 mg/dL — ABNORMAL HIGH (ref 0.50–1.10)
Glucose, Bld: 95 mg/dL (ref 70–99)
Potassium: 4.3 meq/L (ref 3.5–5.3)
Sodium: 136 meq/L (ref 135–145)

## 2012-08-20 NOTE — Progress Notes (Signed)
Called patient TO DISCUSS RESULTS. LVM-CALL [phone] TO DISCUSS. SPOKE TO WILL.

## 2012-08-20 NOTE — Progress Notes (Signed)
REMINDER APPT MADE 

## 2012-08-21 ENCOUNTER — Ambulatory Visit (HOSPITAL_COMMUNITY)
Admission: RE | Admit: 2012-08-21 | Discharge: 2012-08-21 | Disposition: A | Discharge status: Home / Self Care | Payer: BC Managed Care – PPO | Source: Ambulatory Visit | Attending: Gastroenterology | Admitting: Gastroenterology

## 2012-08-21 DIAGNOSIS — R1319 Other dysphagia: Secondary | ICD-10-CM

## 2012-08-21 DIAGNOSIS — R1310 Dysphagia, unspecified: Secondary | ICD-10-CM | POA: Insufficient documentation

## 2012-08-27 ENCOUNTER — Other Ambulatory Visit: Payer: Self-pay | Admitting: Gastroenterology

## 2012-08-27 DIAGNOSIS — K110 Atrophy of salivary gland: Secondary | ICD-10-CM

## 2012-08-27 NOTE — Progress Notes (Signed)
Reminder in epic °

## 2012-08-27 NOTE — Progress Notes (Addendum)
Called patient TO DISCUSS RESULTS. LVM-CALL [phone] TO DISCUSS. LEFT MESSAGE WITH WILL. HE SAID IT'S BEST TO CALL AROUND 8-9 AM. PT HAD CT NECK SHOWING CHANGES IN SALIVARY GLANDS CONSISTENT WITH HAVING PRIOR RADIOACTIVE IODINE OR SJOGREN'S SYNDROME. BPE SHOWED MILD ESOPHAGEAL MOTILITY DISORDER. PT NEEDS A RHEUMATOLOGY EVALUATION FOR SALIVARY GLAND ATROPHY. WOULD OFFER ARTIFICAL SALIVA. DIFFICULTY SWALLOWING MOST LIKELY DUE TO DECREASED SALIVARY PRODUCTION. OPV OCT 2014.

## 2012-08-28 NOTE — Progress Notes (Signed)
Rheumatology Referral has been made and they will call us with date & time

## 2012-12-28 NOTE — Progress Notes (Signed)
REVIEWED.  TCS MAY 2014 NL COLON Bx EGD/DIL MAY 2014 GASTRITIS CT NECK: SALIVARY ATROPHY BPE: MILD ESO D/O   OPV IN JAN OR FEB 2015 E30 DYSPHAGIA

## 2013-06-17 ENCOUNTER — Emergency Department (HOSPITAL_COMMUNITY): Payer: BC Managed Care – PPO

## 2013-06-17 ENCOUNTER — Emergency Department (HOSPITAL_COMMUNITY)
Admission: EM | Admit: 2013-06-17 | Discharge: 2013-06-17 | Disposition: A | Discharge status: Home / Self Care | Payer: BC Managed Care – PPO | Attending: Emergency Medicine | Admitting: Emergency Medicine

## 2013-06-17 ENCOUNTER — Encounter (HOSPITAL_COMMUNITY): Payer: Self-pay | Admitting: Emergency Medicine

## 2013-06-17 DIAGNOSIS — Z79899 Other long term (current) drug therapy: Secondary | ICD-10-CM | POA: Insufficient documentation

## 2013-06-17 DIAGNOSIS — Z9889 Other specified postprocedural states: Secondary | ICD-10-CM | POA: Insufficient documentation

## 2013-06-17 DIAGNOSIS — R51 Headache: Secondary | ICD-10-CM | POA: Insufficient documentation

## 2013-06-17 DIAGNOSIS — K219 Gastro-esophageal reflux disease without esophagitis: Secondary | ICD-10-CM | POA: Insufficient documentation

## 2013-06-17 DIAGNOSIS — J45909 Unspecified asthma, uncomplicated: Secondary | ICD-10-CM | POA: Insufficient documentation

## 2013-06-17 DIAGNOSIS — Z880 Allergy status to penicillin: Secondary | ICD-10-CM | POA: Insufficient documentation

## 2013-06-17 DIAGNOSIS — IMO0002 Reserved for concepts with insufficient information to code with codable children: Secondary | ICD-10-CM | POA: Insufficient documentation

## 2013-06-17 DIAGNOSIS — R079 Chest pain, unspecified: Secondary | ICD-10-CM | POA: Insufficient documentation

## 2013-06-17 DIAGNOSIS — I10 Essential (primary) hypertension: Secondary | ICD-10-CM | POA: Insufficient documentation

## 2013-06-17 DIAGNOSIS — N12 Tubulo-interstitial nephritis, not specified as acute or chronic: Secondary | ICD-10-CM | POA: Insufficient documentation

## 2013-06-17 LAB — URINALYSIS, ROUTINE W REFLEX MICROSCOPIC
Bilirubin Urine: NEGATIVE
GLUCOSE, UA: NEGATIVE mg/dL
KETONES UR: NEGATIVE mg/dL
Nitrite: POSITIVE — AB
PH: 6 (ref 5.0–8.0)
Specific Gravity, Urine: >1.03 — ABNORMAL HIGH (ref 1.005–1.030)
Urobilinogen, UA: 0.2 mg/dL (ref 0.0–1.0)

## 2013-06-17 LAB — CBC WITH DIFFERENTIAL/PLATELET
Basophils Absolute: 0 10*3/uL (ref 0.0–0.1)
Basophils Relative: 0 % (ref 0–1)
Eosinophils Absolute: 0 10*3/uL (ref 0.0–0.7)
Eosinophils Relative: 0 % (ref 0–5)
HEMATOCRIT: 32.6 % — AB (ref 36.0–46.0)
HEMOGLOBIN: 10.7 g/dL — AB (ref 12.0–15.0)
LYMPHS ABS: 0.7 10*3/uL (ref 0.7–4.0)
LYMPHS PCT: 7 % — AB (ref 12–46)
MCH: 30.8 pg (ref 26.0–34.0)
MCHC: 32.8 g/dL (ref 30.0–36.0)
MCV: 93.9 fL (ref 78.0–100.0)
MONO ABS: 0.8 10*3/uL (ref 0.1–1.0)
MONOS PCT: 8 % (ref 3–12)
NEUTROS ABS: 8.9 10*3/uL — AB (ref 1.7–7.7)
Neutrophils Relative %: 85 % — ABNORMAL HIGH (ref 43–77)
Platelets: 287 10*3/uL (ref 150–400)
RBC: 3.47 MIL/uL — AB (ref 3.87–5.11)
RDW: 14.1 % (ref 11.5–15.5)
WBC: 10.5 10*3/uL (ref 4.0–10.5)

## 2013-06-17 LAB — URINE MICROSCOPIC-ADD ON

## 2013-06-17 MED ORDER — CIPROFLOXACIN HCL 500 MG PO TABS: 500.0000 mg | ORAL_TABLET | Freq: Two times a day (BID) | ORAL | Status: DC

## 2013-06-17 MED ORDER — CIPROFLOXACIN HCL 250 MG PO TABS
500.0000 mg | ORAL_TABLET | Freq: Once | ORAL | Status: AC
  Administered 2013-06-17: 500 mg via ORAL
  Filled 2013-06-17: qty 2

## 2013-06-17 MED ORDER — IBUPROFEN 800 MG PO TABS
800.0000 mg | ORAL_TABLET | Freq: Once | ORAL | Status: AC
  Administered 2013-06-17: 800 mg via ORAL
  Filled 2013-06-17: qty 1

## 2013-06-17 NOTE — ED Notes (Signed)
Pt had nurse at employer to check her BP and was told 85/57

## 2013-06-17 NOTE — ED Notes (Signed)
Pt states that she removed a tick from herself that had been attached yesterday, fever and pt states hypotensive, body aches; ibuprofen this morning (0900)

## 2013-06-17 NOTE — ED Provider Notes (Signed)
CSN: [id number]     Arrival date & time 06/17/13  1547 History   First MD Initiated Contact with Patient 06/17/13 1636 This chart was scribed for Rolland PorterMark Aarica Wax, MD by Valera CastleSteven Cross, ED Scribe. This patient was seen in room APA05/APA05 and the patient's care was started at 4:42 PM.     Chief Complaint  Patient presents with  . Fever  (Consider location/radiation/quality/duration/timing/severity/associated sxs/prior Treatment) The history is provided by the patient. No language interpreter was used.   HPI Comments: Grace ConesHazel M Huettner is a 58 y.o. female who presents to the Emergency Department complaining of a constant fever, onset yesterday evening, with associated SOB, headache, chills, and body aches. She reports some chest pain with her deep breathing, secondary to her SOB. She states she removed a tick from her left calf yesterday, but denies knowing how long the tick was attached. She states she had been outside 2 days ago. She denies nausea, vomiting, rash, dysuria, urinary frequency, and any other associated symptoms. She reports h/o asthma, cardiac arrythmia, acid reflex. She denies h/o DM.   PCP - No primary provider on file.  Past Medical History  Diagnosis Date  . Hypertension   . Asthma   . Acid reflux   . Cardiac arrhythmia    Past Surgical History  Procedure Laterality Date  . None    . Colonoscopy, esophagogastroduodenoscopy (egd) and esophageal dilation N/A 06/17/2012    [id number]:[id number]:TICS IN CECUM/Mod IH/ESO ring/Gastritis   Family History  Problem Relation Age of Onset  . Colon cancer Neg Hx   . Inflammatory bowel disease Neg Hx    History  Substance Use Topics  . Smoking status: Never Smoker   . Smokeless tobacco: Not on file  . Alcohol Use: No   OB History   Grav Para Term Preterm Abortions TAB SAB Ect Mult Living                 Review of Systems  Constitutional: Positive for fever and chills. Negative for diaphoresis, appetite change and fatigue.  HENT: Negative for  mouth sores, sore throat and trouble swallowing.   Eyes: Negative for visual disturbance.  Respiratory: Positive for shortness of breath. Negative for cough, chest tightness and wheezing.   Cardiovascular: Positive for chest pain (secondary to SOB).  Gastrointestinal: Negative for nausea, vomiting, abdominal pain, diarrhea and abdominal distention.  Endocrine: Negative for polydipsia, polyphagia and polyuria.  Genitourinary: Negative for dysuria, frequency and hematuria.  Musculoskeletal: Positive for myalgias (body aches). Negative for gait problem.  Skin: Negative for color change, pallor and rash.  Neurological: Positive for headaches. Negative for dizziness, syncope and light-headedness.  Hematological: Does not bruise/bleed easily.  Psychiatric/Behavioral: Negative for behavioral problems and confusion.    Allergies  Penicillins  Home Medications   Prior to Admission medications   Medication Sig Start Date End Date Taking? Authorizing Provider  albuterol (PROAIR HFA) 108 (90 BASE) MCG/ACT inhaler Inhale 2 puffs into the lungs every 6 (six) hours as needed. For shortness of breath    Historical Provider, MD  escitalopram (LEXAPRO) 10 MG tablet Take 10 mg by mouth daily.    Historical Provider, MD  fexofenadine (ALLEGRA) 180 MG tablet Take 180 mg by mouth daily.    Historical Provider, MD  Fluticasone-Salmeterol (ADVAIR) 100-50 MCG/DOSE AEPB Inhale 1 puff into the lungs every 12 (twelve) hours.    Historical Provider, MD  Multiple Vitamin (MULTIVITAMIN) capsule Take 1 capsule by mouth daily.    Historical Provider, MD  nebivolol (  BYSTOLIC) 10 MG tablet Take 10 mg by mouth daily.    Historical Provider, MD  omeprazole (PRILOSEC) 20 MG capsule 1 po 30 mins prior to meals bid 06/17/12   West BaliSandi L Fields, MD   BP 108/68  Pulse 85  Temp(Src) 100.2 F (37.9 C) (Oral)  Resp 20  Wt 104 lb (47.174 kg)  SpO2 95%  Physical Exam  Constitutional: She is oriented to person, place, and time.  She appears well-developed and well-nourished. No distress.  Temperature in exam room 103.1  HENT:  Head: Normocephalic.  Mouth/Throat: Uvula is midline, oropharynx is clear and moist and mucous membranes are normal. No oropharyngeal exudate, posterior oropharyngeal edema or posterior oropharyngeal erythema.  Eyes: Conjunctivae and EOM are normal. Pupils are equal, round, and reactive to light. No scleral icterus.  Neck: Normal range of motion. Neck supple. No thyromegaly present.  Cardiovascular: Normal rate and regular rhythm.  Exam reveals no gallop and no friction rub.   No murmur heard. Pulmonary/Chest: Effort normal. No respiratory distress. She has no wheezes. She has rhonchi. She has no rales.  Few scattered rhonchi mid lungs.   Abdominal: Soft. Bowel sounds are normal. She exhibits no distension. There is no tenderness. There is no rebound.  Musculoskeletal: Normal range of motion.  Neurological: She is alert and oriented to person, place, and time.  Skin: Skin is warm and dry. No rash noted.  No rash. Single macule left popliteal fossa where tick was removed. No local reaction.   Psychiatric: She has a normal mood and affect. Her behavior is normal.    ED Course  Procedures (including critical care time)  DIAGNOSTIC STUDIES: Oxygen Saturation is 95% on room air, adequate by my interpretation.    COORDINATION OF CARE: 4:48 PM-Discussed treatment plan which includes CXR, UA, and blood work with pt at bedside and pt agreed to plan.   Results for orders placed during the hospital encounter of 06/17/13  URINALYSIS, ROUTINE W REFLEX MICROSCOPIC      Result Value Ref Range   Color, Urine YELLOW  YELLOW   APPearance CLEAR  CLEAR   Specific Gravity, Urine >1.030 (*) 1.005 - 1.030   pH 6.0  5.0 - 8.0   Glucose, UA NEGATIVE  NEGATIVE mg/dL   Hgb urine dipstick TRACE (*) NEGATIVE   Bilirubin Urine NEGATIVE  NEGATIVE   Ketones, ur NEGATIVE  NEGATIVE mg/dL   Protein, ur TRACE (*)  NEGATIVE mg/dL   Urobilinogen, UA 0.2  0.0 - 1.0 mg/dL   Nitrite POSITIVE (*) NEGATIVE   Leukocytes, UA TRACE (*) NEGATIVE  CBC WITH DIFFERENTIAL      Result Value Ref Range   WBC 10.5  4.0 - 10.5 K/uL   RBC 3.47 (*) 3.87 - 5.11 MIL/uL   Hemoglobin 10.7 (*) 12.0 - 15.0 g/dL   HCT 40.932.6 (*) 81.136.0 - 91.446.0 %   MCV 93.9  78.0 - 100.0 fL   MCH 30.8  26.0 - 34.0 pg   MCHC 32.8  30.0 - 36.0 g/dL   RDW 78.214.1  95.611.5 - 21.315.5 %   Platelets 287  150 - 400 K/uL   Neutrophils Relative % 85 (*) 43 - 77 %   Neutro Abs 8.9 (*) 1.7 - 7.7 K/uL   Lymphocytes Relative 7 (*) 12 - 46 %   Lymphs Abs 0.7  0.7 - 4.0 K/uL   Monocytes Relative 8  3 - 12 %   Monocytes Absolute 0.8  0.1 - 1.0 K/uL   Eosinophils  Relative 0  0 - 5 %   Eosinophils Absolute 0.0  0.0 - 0.7 K/uL   Basophils Relative 0  0 - 1 %   Basophils Absolute 0.0  0.0 - 0.1 K/uL  URINE MICROSCOPIC-ADD ON      Result Value Ref Range   Squamous Epithelial / LPF RARE  RARE   WBC, UA 21-50  <3 WBC/hpf   RBC / HPF 0-2  <3 RBC/hpf   Bacteria, UA MANY (*) RARE   No results found.   EKG Interpretation None     Medications  ciprofloxacin (CIPRO) tablet 500 mg (not administered)  ibuprofen (ADVIL,MOTRIN) tablet 800 mg (800 mg Oral Given 06/17/13 1657)    MDM   Final diagnoses:  Pyelonephritis    Urine appears to be a nidus for her infection. Given Cipro. Prescription for the same. Plan is rest, fluids, Motrin or Tylenol for fever control. Her primary care physician for followup. Return to the ER with any difficulties.  I personally performed the services described in this documentation, which was scribed in my presence. The recorded information has been reviewed and is accurate.    Rolland Porter, MD 06/17/13 [phone]

## 2013-06-17 NOTE — Discharge Instructions (Signed)
Pyelonephritis, Adult °Pyelonephritis is a kidney infection. A kidney infection can happen quickly, or it can last for a long time. °HOME CARE  °· Take your medicine (antibiotics) as told. Finish it even if you start to feel better. °· Keep all doctor visits as told. °· Drink enough fluids to keep your pee (urine) clear or pale yellow. °· Only take medicine as told by your doctor. °GET HELP RIGHT AWAY IF:  °· You have a fever or lasting symptoms for more than 2-3 days. °· You have a fever and your symptoms suddenly get worse. °· You cannot take your medicine or drink fluids as told. °· You have chills and shaking. °· You feel very weak or pass out (faint). °· You do not feel better after 2 days. °MAKE SURE YOU: °· Understand these instructions. °· Will watch your condition. °· Will get help right away if you are not doing well or get worse. °Document Released: 02/24/2004 Document Revised: 07/18/2011 Document Reviewed: 07/06/2010 °[employer]® Patient Information ©2014 [employer]. ° °

## 2013-06-17 NOTE — ED Notes (Signed)
MD at bedside. 

## 2013-06-25 ENCOUNTER — Other Ambulatory Visit: Payer: Self-pay | Admitting: *Deleted

## 2013-06-27 ENCOUNTER — Other Ambulatory Visit: Payer: Self-pay

## 2013-06-27 MED ORDER — OMEPRAZOLE 20 MG PO CPDR: 20.0000 mg | DELAYED_RELEASE_CAPSULE | Freq: Two times a day (BID) | ORAL | Status: DC

## 2013-08-14 ENCOUNTER — Ambulatory Visit (INDEPENDENT_AMBULATORY_CARE_PROVIDER_SITE_OTHER): Payer: BC Managed Care – PPO | Admitting: Gastroenterology

## 2013-08-14 ENCOUNTER — Encounter (INDEPENDENT_AMBULATORY_CARE_PROVIDER_SITE_OTHER): Payer: Self-pay

## 2013-08-14 ENCOUNTER — Encounter: Payer: Self-pay | Admitting: Gastroenterology

## 2013-08-14 VITALS — BP 124/90 | HR 88 | Temp 98.0°F | Ht 61.0 in | Wt 102.8 lb

## 2013-08-14 DIAGNOSIS — R194 Change in bowel habit: Secondary | ICD-10-CM

## 2013-08-14 DIAGNOSIS — R1314 Dysphagia, pharyngoesophageal phase: Secondary | ICD-10-CM

## 2013-08-14 DIAGNOSIS — R1310 Dysphagia, unspecified: Secondary | ICD-10-CM

## 2013-08-14 DIAGNOSIS — R198 Other specified symptoms and signs involving the digestive system and abdomen: Secondary | ICD-10-CM

## 2013-08-14 DIAGNOSIS — R1319 Other dysphagia: Secondary | ICD-10-CM

## 2013-08-14 NOTE — Patient Instructions (Signed)
DRINK WATER TO KEEP YOUR URINE LIGHT YELLOW.  CALL IN 3 DAYS IF YOUR DIARRHEA CONTINUES.  AVOID DAIRY FOR 5 DAYS. SEE INFO BELOW.Marland Kitchen.  TAKE A PROBIOTIC DAILY FOR ONE MONTH ([employer] BRAND, [employer] HEALTH, ALIGN).   FOLLOW UP IN 6 MOS.    Lactose Free Diet Lactose is a carbohydrate that is found mainly in milk and milk products, as well as in foods with added milk or whey. Lactose must be digested by the enzyme in order to be used by the body. Lactose intolerance occurs when there is a shortage of lactase. When your body is not able to digest lactose, you may feel sick to your stomach (nausea), bloating, cramping, gas and diarrhea.  There are many dairy products that may be tolerated better than milk by some people:  The use of cultured dairy products such as yogurt, buttermilk, cottage cheese, and sweet acidophilus milk (Kefir) for lactase-deficient individuals is usually well tolerated. This is because the healthy bacteria help digest lactose.   Lactose-hydrolyzed milk (Lactaid) contains 40-90% less lactose than milk and may also be well tolerated.    SPECIAL NOTES  Lactose is a carbohydrates. The major food source is dairy products. Reading food labels is important. Many products contain lactose even when they are not made from milk. Look for the following words: whey, milk solids, dry milk solids, nonfat dry milk powder. Typical sources of lactose other than dairy products include breads, candies, cold cuts, prepared and processed foods, and commercial sauces and gravies.   All foods must be prepared without milk, cream, or other dairy foods.   Soy milk and lactose-free supplements (LACTASE) may be used as an alternative to milk.   FOOD GROUP ALLOWED/RECOMMENDED AVOID/USE SPARINGLY  BREADS / STARCHES 4 servings or more* Breads and rolls made without milk. [place], [place], or [place] bread. Breads and rolls that contain milk. Prepared mixes such as muffins, biscuits, waffles,  pancakes. Sweet rolls, donuts, [place] toast (if made with milk or lactose).  Crackers: Soda crackers, graham crackers. Any crackers prepared without lactose. Zwieback crackers, corn curls, or any that contain lactose.  Cereals: Cooked or dry cereals prepared without lactose (read labels). Cooked or dry cereals prepared with lactose (read labels). Total, Cocoa Krispies. Special K.  Potatoes / Pasta / Rice: Any prepared without milk or lactose. Popcorn. Instant potatoes, frozen Jamaica[place] fries, scalloped or au gratin potatoes.  VEGETABLES 2 servings or more Fresh, frozen, and canned vegetables. Creamed or breaded vegetables. Vegetables in a cheese sauce or with lactose-containing margarines.  FRUIT 2 servings or more All fresh, canned, or frozen fruits that are not processed with lactose. Any canned or frozen fruits processed with lactose.  MEAT & SUBSTITUTES 2 servings or more (4 to 6 oz. total per day) Plain beef, chicken, fish, Malaw[place]key, lamb, veal, pork, or ham. Kosher prepared meat products. Strained or junior meats that do not contain milk. Eggs, soy meat substitutes, nuts. Scrambled eggs, omelets, and souffles that contain milk. Creamed or breaded meat, fish, or fowl. Sausage products such as wieners, liver sausage, or cold cuts that contain milk solids. Cheese, cottage cheese, or cheese spreads.  MILK None. (See "BEVERAGES" for milk substitutes. See "DESSERTS" for ice cream and frozen desserts.) Milk (whole, 2%, skim, or chocolate). Evaporated, powdered, or condensed milk; malted milk.  SOUPS & COMBINATION FOODS Bouillon, broth, vegetable soups, clear soups, consomms. Homemade soups made with allowed ingredients. Combination or prepared foods that do not contain milk or milk products (read labels).  Cream soups, chowders, commercially prepared soups containing lactose. Macaroni and cheese, pizza. Combination or prepared foods that contain milk or milk products.  DESSERTS & SWEETS In moderation  Water and fruit ices; gelatin; angel food cake. Homemade cookies, pies, or cakes made from allowed ingredients. Pudding (if made with water or a milk substitute). Lactose-free tofu desserts. Sugar, honey, corn syrup, jam, jelly; marmalade; molasses (beet sugar); Pure sugar candy; marshmallows. Ice cream, ice milk, sherbet, custard, pudding, frozen yogurt. Commercial cake and cookie mixes. Desserts that contain chocolate. Pie crust made with milk-containing margarine; reduced-calorie desserts made with a sugar substitute that contains lactose. Toffee, peppermint, butterscotch, chocolate, caramels.  FATS & OILS In moderation Butter (as tolerated; contains very small amounts of lactose). Margarines and dressings that do not contain milk, Vegetable oils, shortening, Miracle Whip, mayonnaise, nondairy cream & whipped toppings without lactose or milk solids added (examples: Coffee Rich, Carnation Coffeemate, Rich's Whipped Topping, PolyRich). Tomasa Blase. Margarines and salad dressings containing milk; cream, cream cheese; peanut butter with added milk solids, sour cream, chip dips, made with sour cream.  BEVERAGES Carbonated drinks; tea; coffee and freeze-dried coffee; some instant coffees (check labels). Fruit drinks; fruit and vegetable juice; Rice or Soy milk. Ovaltine, hot chocolate. Some cocoas; some instant coffees; instant iced teas; powdered fruit drinks (read labels).   CONDIMENTS / MISCELLANEOUS Soy sauce, carob powder, olives, gravy made with water, baker's cocoa, pickles, pure seasonings and spices, wine, pure monosodium glutamate, catsup, mustard. Some chewing gums, chocolate, some cocoas. Certain antibiotics and vitamin / mineral preparations. Spice blends if they contain milk products. MSG extender. Artificial sweeteners that contain lactose such as Equal (Nutra-Sweet) and Sweet 'n Low. Some nondairy creamers (read labels).   SAMPLE MENU*  Breakfast   Orange Juice.  Banana.   Bran flakes.    Nondairy Creamer.  Vienna Bread (toasted).   Butter or milk-free margarine.   Coffee or tea.    Noon Meal   Chicken Breast.  Rice.   Green beans.   Butter or milk-free margarine.  Fresh melon.   Coffee or tea.    Evening Meal   Roast Beef.  Baked potato.   Butter or milk-free margarine.   Broccoli.   Lettuce salad with vinegar and oil dressing.  [employer].   Coffee or tea.

## 2013-08-14 NOTE — Progress Notes (Signed)
No pcp

## 2013-08-14 NOTE — Progress Notes (Signed)
   Subjective:    Patient ID: Grace Cross, female    DOB: February 22, 1955, 58 y.o.   MRN: [id number]  No primary provider on file.   HPI Saw rheumatology and diagnosed with Sjogrens. Now on pills to help saliva. SWALLOWING BETTER BUT NOT COMPLETELY NORMAL. WEIGHT: 2 LBS DOWN. WAS SICK: FEVER/CHILLS/DIARRHEA/CRAMPY ABDOMINAL PAIN. BEFORE HER APPT IN MON. STILL HAVING PROBLEMS WITH DIARRHEA: TUES HAD IT ALL DAY AND ALL NIGHT,YESTERDAY BETTER. MAY HAVE SOB FROM ASTHMA. HEARTBURN: OFF AND ON, 2-3X/WEEK. TAKING MEDS AND IT HELPS.  PT DENIES HEMATOCHEZIA, nausea, vomiting, melena, CHEST PAIN,  OR constipation.  Past Medical History  Diagnosis Date  . Hypertension   . Asthma   . Acid reflux   . Cardiac arrhythmia     Past Surgical History  Procedure Laterality Date  . None    . Colonoscopy, esophagogastroduodenoscopy (egd) and esophageal dilation N/A 06/17/2012    [id number]:TICS IN CECUM/Mod IH/ESO ring/Gastritis    Allergies  Allergen Reactions  . Penicillins Rash    Current Outpatient Prescriptions  Medication Sig Dispense Refill  . albuterol (PROAIR HFA) 108 (90 BASE) MCG/ACT inhaler Inhale 2 puffs into the lungs every 6 (six) hours as needed. For shortness of breath      . ciprofloxacin (CIPRO) 500 MG tablet Take 1 tablet (500 mg total) by mouth every 12 (twelve) hours.   0  . escitalopram (LEXAPRO) 10 MG tablet Take 10 mg by mouth every morning.      . fexofenadine (ALLEGRA) 180 MG tablet Take 180 mg by mouth every morning.      . Fluticasone-Salmeterol (ADVAIR) 100-50 MCG/DOSE AEPB Inhale 1 puff into the lungs every 12 (twelve) hours.     . hydroxychloroquine (PLAQUENIL) 200 MG tablet Take 200 mg by mouth every morning.     . methocarbamol (ROBAXIN) 750 MG tablet Take 750 mg by mouth daily as needed. For muscle relaxant     . Multiple Vitamin (MULTIVITAMIN) capsule Take 1 capsule by mouth every morning.      Marland Kitchen. omeprazole (PRILOSEC) 20 MG capsule Take 1 capsule (20 mg total) by mouth 2  (two) times daily before a meal.   11  . pilocarpine (SALAGEN) 5 MG tablet Take 5 mg by mouth 3 (three) times daily.          Review of Systems     Objective:   Physical Exam  Vitals reviewed. Constitutional: She is oriented to person, place, and time. She appears well-developed and well-nourished. No distress.  HENT:  Head: Normocephalic and atraumatic.  Mouth/Throat: Oropharynx is clear and moist. No oropharyngeal exudate.  Eyes: Pupils are equal, round, and reactive to light. No scleral icterus.  Neck: Normal range of motion. Neck supple.  Cardiovascular: Normal rate, regular rhythm and normal heart sounds.   Pulmonary/Chest: Effort normal and breath sounds normal. No respiratory distress.  Abdominal: Soft. Bowel sounds are normal. She exhibits no distension. There is tenderness. There is no rebound and no guarding.  MILD LLQ TTP   Musculoskeletal: She exhibits no edema.  Lymphadenopathy:    She has no cervical adenopathy.  Neurological: She is alert and oriented to person, place, and time.  Psychiatric: She has a normal mood and affect.          Assessment & Plan:

## 2013-08-14 NOTE — Assessment & Plan Note (Signed)
DUE TO SJOGRENS' SYNDROME. FOLLOWED BY RHEUMATOLOGY.  CLINICALLY IMPROVED ON PLAQUENIL AND PILOCARPINE. DRINK WATER WITH MEALS FOLLOW UP IN 6 MOS.

## 2013-08-14 NOTE — Progress Notes (Signed)
Reminder in epic °

## 2013-08-14 NOTE — Assessment & Plan Note (Signed)
MOST LIKELY DUE TO VIRAL ILLNESS, LESS LIKELY C DIFF COLITIS. SX IMPROVING.  CALL IN 3 DAYS IF YOUR DIARRHEA CONTINUES. AVOID DAIRY TAKE A PROBIOTIC FOLLOW UP IN 6 MOS.

## 2014-01-19 ENCOUNTER — Encounter: Payer: Self-pay | Admitting: Gastroenterology

## 2014-06-03 ENCOUNTER — Ambulatory Visit: Payer: Self-pay | Admitting: Gastroenterology

## 2014-06-18 ENCOUNTER — Encounter (INDEPENDENT_AMBULATORY_CARE_PROVIDER_SITE_OTHER): Payer: Self-pay | Admitting: Gastroenterology

## 2014-06-18 ENCOUNTER — Telehealth: Payer: Self-pay | Admitting: Gastroenterology

## 2014-06-18 ENCOUNTER — Encounter: Payer: Self-pay | Admitting: Gastroenterology

## 2014-06-18 NOTE — Telephone Encounter (Signed)
PATIENT WAS A NO SHOW AND LETTER WAS SENT  °

## 2014-06-18 NOTE — Telephone Encounter (Signed)
REVIEWED-NO ADDITIONAL RECOMMENDATIONS. 

## 2014-06-18 NOTE — Progress Notes (Signed)
   Subjective:    Patient ID: Grace Cross, female    DOB: February 20, 1955, 59 y.o.   MRN: [id number]  HPI   Past Medical History  Diagnosis Date  . Hypertension   . Asthma   . Acid reflux   . Cardiac arrhythmia    Past Surgical History  Procedure Laterality Date  . None    . Colonoscopy, esophagogastroduodenoscopy (egd) and esophageal dilation N/A 06/17/2012    [id number]:TICS IN CECUM/Mod IH/ESO ring/Gastritis     Review of Systems     Objective:   Physical Exam        Assessment & Plan:

## 2014-07-16 ENCOUNTER — Other Ambulatory Visit: Payer: Self-pay

## 2014-07-17 MED ORDER — OMEPRAZOLE 20 MG PO CPDR: 20.0000 mg | DELAYED_RELEASE_CAPSULE | Freq: Two times a day (BID) | ORAL | Status: DC

## 2014-09-11 ENCOUNTER — Encounter (HOSPITAL_COMMUNITY): Payer: Self-pay

## 2014-09-11 ENCOUNTER — Emergency Department (HOSPITAL_COMMUNITY)
Admission: EM | Admit: 2014-09-11 | Discharge: 2014-09-11 | Disposition: A | Discharge status: Home / Self Care | Payer: BLUE CROSS/BLUE SHIELD | Attending: Emergency Medicine | Admitting: Emergency Medicine

## 2014-09-11 DIAGNOSIS — Z8719 Personal history of other diseases of the digestive system: Secondary | ICD-10-CM | POA: Insufficient documentation

## 2014-09-11 DIAGNOSIS — Z79899 Other long term (current) drug therapy: Secondary | ICD-10-CM | POA: Diagnosis not present

## 2014-09-11 DIAGNOSIS — Z880 Allergy status to penicillin: Secondary | ICD-10-CM | POA: Diagnosis not present

## 2014-09-11 DIAGNOSIS — J45909 Unspecified asthma, uncomplicated: Secondary | ICD-10-CM | POA: Insufficient documentation

## 2014-09-11 DIAGNOSIS — I10 Essential (primary) hypertension: Secondary | ICD-10-CM | POA: Diagnosis not present

## 2014-09-11 DIAGNOSIS — M79606 Pain in leg, unspecified: Secondary | ICD-10-CM | POA: Diagnosis present

## 2014-09-11 LAB — CBC WITH DIFFERENTIAL/PLATELET
BASOS PCT: 1 % (ref 0–1)
Basophils Absolute: 0 10*3/uL (ref 0.0–0.1)
EOS PCT: 1 % (ref 0–5)
Eosinophils Absolute: 0.1 10*3/uL (ref 0.0–0.7)
HCT: 32.9 % — ABNORMAL LOW (ref 36.0–46.0)
Hemoglobin: 10.7 g/dL — ABNORMAL LOW (ref 12.0–15.0)
Lymphocytes Relative: 29 % (ref 12–46)
Lymphs Abs: 1.2 10*3/uL (ref 0.7–4.0)
MCH: 30.5 pg (ref 26.0–34.0)
MCHC: 32.5 g/dL (ref 30.0–36.0)
MCV: 93.7 fL (ref 78.0–100.0)
MONO ABS: 0.4 10*3/uL (ref 0.1–1.0)
Monocytes Relative: 9 % (ref 3–12)
Neutro Abs: 2.5 10*3/uL (ref 1.7–7.7)
Neutrophils Relative %: 60 % (ref 43–77)
PLATELETS: 246 10*3/uL (ref 150–400)
RBC: 3.51 MIL/uL — ABNORMAL LOW (ref 3.87–5.11)
RDW: 13.9 % (ref 11.5–15.5)
WBC: 4.2 10*3/uL (ref 4.0–10.5)

## 2014-09-11 LAB — BASIC METABOLIC PANEL
Anion gap: 5 (ref 5–15)
BUN: 18 mg/dL (ref 6–20)
CALCIUM: 7.7 mg/dL — AB (ref 8.9–10.3)
CHLORIDE: 107 mmol/L (ref 101–111)
CO2: 24 mmol/L (ref 22–32)
Creatinine, Ser: 0.88 mg/dL (ref 0.44–1.00)
GFR calc Af Amer: >60 mL/min (ref >60)
GFR calc non Af Amer: >60 mL/min (ref >60)
GLUCOSE: 93 mg/dL (ref 65–99)
Potassium: 3.8 mmol/L (ref 3.5–5.1)
SODIUM: 136 mmol/L (ref 135–145)

## 2014-09-11 LAB — CK: Total CK: 235 U/L — ABNORMAL HIGH (ref 38–234)

## 2014-09-11 MED ORDER — CALCIUM CARBONATE-VITAMIN D 500-200 MG-UNIT PO TABS: 1.0000 | ORAL_TABLET | Freq: Three times a day (TID) | ORAL | Status: DC

## 2014-09-11 MED ORDER — SODIUM CHLORIDE 0.9 % IV BOLUS (SEPSIS)
1000.0000 mL | Freq: Once | INTRAVENOUS | Status: AC
  Administered 2014-09-11: 1000 mL via INTRAVENOUS

## 2014-09-11 NOTE — ED Notes (Signed)
MD at bedside. 

## 2014-09-11 NOTE — ED Notes (Signed)
Patient brought in by [facility] for bilateral leg cramps/bilateral rib pain. Patient states she was at work started to walk when both legs began to hurt. Patient states she has had previous issue but just got more muscle relaxers but hasnt had a chance to take them yet. EMS reports patient was hypertensive and tachycardia. Patient states her PCP took her off BP medications. Denies chest pain, difficulty breathing. NADN.

## 2014-09-11 NOTE — Discharge Instructions (Signed)
Hypocalcemia, Adult Hypocalcemia is low blood calcium. Calcium is important for cells to function in the body. Low blood calcium can cause a variety of symptoms and problems. CAUSES   Low levels of a body protein called albumin.  Problems with the parathyroid glands or surgical removal of the parathyroid glands. The parathyroid glands maintain the body's level of calcium.  Decreased production or improper use of parathyroid hormone.  Lack (deficiency) of vitamin D or magnesium or both.  Intestinal problems that interfere with nutrient absorption.  Alcoholism.  Kidney problems.  Inflammation of the pancreas (pancreatitis).  Certain medicines.  Severe infections (sepsis).  Infiltrative diseases. With these diseases the parathyroid glands are filled with cells or substances that are not normally present. Examples include:  Sarcoidosis.  Hemachromatosis.  Breakdown of large amounts of muscle fiber.  High levels of phosphate in the body.  Cancer.  Massive blood transfusions which usually occur with severe trauma. SYMPTOMS   Numbness and tingling in the fingers, toes, or around the mouth.  Muscle aches or cramps, especially in the legs, feet, and back.  Muscle twitches.  Shortness of breath or wheezing.  Difficulty swallowing.  Changes in the sound of the voice.  General weakness.  Fainting.  Fast heart beats (palpitations).  Chest pain.  Irritability.  Difficulty thinking.  Memory problems or confusion.  Severe fatigue.  Changes in personality.  Depression and anxiety.  Shaking uncontrollably (seizures).  Coarse, brittle hair and nails.  Dry skin or lasting (chronic) skin diseases (psoriasis, eczema, or dermatitis).  Clouding of the eye lens (cataracts).  Abdominal cramping or pain. DIAGNOSIS  Hypocalcemia is usually diagnosed through blood tests that reveal a low level of blood calcium. Other tests, such as a recording of the electrical  activity of the heart (electrocardiogram, EKG), may be performed in order to diagnose the underlying cause of the condition. TREATMENT  Treatment for hypocalcemia includes giving calcium supplements. These can be given by mouth or by intravenous (IV) access tube, depending on the severity of the symptoms and deficiency. Other minerals (electrolytes), such as magnesium, may also be given. HOME CARE INSTRUCTIONS   Meet with a dietitian to make sure you are eating the most healthful diet possible, or follow diet instructions as directed by your caregiver.  Follow up with your caregiver as directed. SEEK IMMEDIATE MEDICAL CARE IF:   You develop chest pain.  You develop persistent rapid or irregular heartbeats.  You have difficulty breathing.  You faint.  You develop increased fatigue.  You have new swelling in the feet, ankles, or legs.  You develop increased muscle twitching.  You start to have seizures.  You develop confusion.  You develop mood, memory, or personality changes. MAKE SURE YOU:   Understand these instructions.  Will watch your condition.  Will get help right away if you are not doing well or get worse. Document Released: 07/06/2009 Document Revised: 04/10/2011 Document Reviewed: 07/06/2009 [facility] Patient Information 2015 [place], [place]. This information is not intended to replace advice given to you by your health care provider. Make sure you discuss any questions you have with your health care provider.  Your calcium level was low today.   This will need to be rechecked by your primary care doctor.  Prescription for calcium replacement.   Will hold off on blood pressure medication for the moment.

## 2014-09-11 NOTE — ED Provider Notes (Signed)
CSN: [id number]     Arrival date & time 09/11/14  1639 History   First MD Initiated Contact with Patient 09/11/14 1648     Chief Complaint  Patient presents with  . Leg Pain     (Consider location/radiation/quality/duration/timing/severity/associated sxs/prior Treatment) HPI.... 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 She is not dehydrated. This has happened in the past and she was prescribed muscle relaxers.  No chest pain, dyspnea, neurodeficits, fever, chills. Severity is mild to moderate. Nothing makes symptoms better or worse.  Past Medical History  Diagnosis Date  . Hypertension   . Asthma   . Acid reflux   . Cardiac arrhythmia    Past Surgical History  Procedure Laterality Date  . None    . Colonoscopy, esophagogastroduodenoscopy (egd) and esophageal dilation N/A 06/17/2012    WUJ:WJXB IN CECUM/Mod IH/ESO ring/Gastritis   Family History  Problem Relation Age of Onset  . Colon cancer Neg Hx   . Inflammatory bowel disease Neg Hx    Social History  Substance Use Topics  . Smoking status: Never Smoker   . Smokeless tobacco: None  . Alcohol Use: No   OB History    No data available     Review of Systems  All other systems reviewed and are negative.     Allergies  Penicillins  Home Medications   Prior to Admission medications   Medication Sig Start Date End Date Taking? Authorizing Provider  albuterol (PROAIR HFA) 108 (90 BASE) MCG/ACT inhaler Inhale 2 puffs into the lungs every 6 (six) hours as needed. For shortness of breath   Yes Historical Provider, MD  azithromycin (ZITHROMAX) 250 MG tablet Take 250-500 mg by mouth See admin instructions. Take two tablets by mouth on day 1 then take one tablet on days 2 through 5   Yes Historical Provider, MD  cevimeline (EVOXAC) 30 MG capsule Take 30 mg by mouth 3 (three) times daily. For mouth dryness   Yes Historical Provider, MD  dicyclomine (BENTYL) 10 MG capsule Take 10 mg by mouth daily  as needed for spasms.   Yes Historical Provider, MD  escitalopram (LEXAPRO) 10 MG tablet Take 10 mg by mouth every morning.    Yes Historical Provider, MD  Fluticasone-Salmeterol (ADVAIR) 100-50 MCG/DOSE AEPB Inhale 1 puff into the lungs every 12 (twelve) hours.   Yes Historical Provider, MD  hydroxychloroquine (PLAQUENIL) 200 MG tablet Take 200 mg by mouth daily as needed.  06/05/13  Yes Historical Provider, MD  methocarbamol (ROBAXIN) 750 MG tablet Take 750 mg by mouth daily as needed for muscle spasms. For muscle relaxant 04/10/13  Yes Historical Provider, MD  calcium-vitamin D (OSCAL WITH D) 500-200 MG-UNIT per tablet Take 1 tablet by mouth 3 (three) times daily. 09/11/14   Donnetta Hutching, MD   BP 174/123 mmHg  Pulse 87  Temp(Src) 98.7 F (37.1 C) (Oral)  Resp 16  Ht 5\' 1"  (1.549 m)  Wt 105 lb (47.628 kg)  BMI 19.85 kg/m2  SpO2 99% Physical Exam  Constitutional: She is oriented to person, place, and time. She appears well-developed and well-nourished.  HENT:  Head: Normocephalic and atraumatic.  Eyes: Conjunctivae and EOM are normal. Pupils are equal, round, and reactive to light.  Neck: Normal range of motion. Neck supple.  Cardiovascular: Normal rate and regular rhythm.   Pulmonary/Chest: Effort normal and breath sounds normal.  Abdominal: Soft. Bowel sounds are normal.  Musculoskeletal: Normal range of motion.  Neurological: She is alert and  oriented to person, place, and time.  Skin: Skin is warm and dry.  Psychiatric: She has a normal mood and affect. Her behavior is normal.  Nursing note and vitals reviewed.   ED Course  Procedures (including critical care time) Labs Review Labs Reviewed  BASIC METABOLIC PANEL - Abnormal; Notable for the following:    Calcium 7.7 (*)    All other components within normal limits  CBC WITH DIFFERENTIAL/PLATELET - Abnormal; Notable for the following:    RBC 3.51 (*)    Hemoglobin 10.7 (*)    HCT 32.9 (*)    All other components within normal  limits  CK - Abnormal; Notable for the following:    Total CK 235 (*)    All other components within normal limits  PARATHYROID HORMONE, INTACT (NO CA)    Imaging Review No results found. I, Skyeler Scalese, personally reviewed and evaluated these images and lab results as part of my medical decision-making.   EKG Interpretation None      MDM   Final diagnoses:  Hypocalcemia    Patient is in no acute distress. Calcium level noted to be low. This was discussed with the patient. Calcium supplementation prescribed. Parathyroid hormone level pending. She will follow-up with her primary care doctor.    Donnetta Hutching, MD 09/11/14 2059

## 2014-09-11 NOTE — ED Notes (Signed)
Dr. Adriana Simas made aware of vs.

## 2015-08-09 ENCOUNTER — Other Ambulatory Visit: Payer: Self-pay | Admitting: Gastroenterology

## 2015-08-10 ENCOUNTER — Encounter: Payer: Self-pay | Admitting: Gastroenterology

## 2015-08-10 NOTE — Telephone Encounter (Signed)
APPT MADE AND LETTER SENT  °

## 2015-08-10 NOTE — Telephone Encounter (Signed)
Patient hasn't been seen in 2 years. Need ov for further refills. Refill X 2 today.

## 2015-09-23 ENCOUNTER — Encounter: Payer: Self-pay | Admitting: Gastroenterology

## 2015-09-23 ENCOUNTER — Ambulatory Visit (INDEPENDENT_AMBULATORY_CARE_PROVIDER_SITE_OTHER): Payer: BLUE CROSS/BLUE SHIELD | Admitting: Gastroenterology

## 2015-09-23 DIAGNOSIS — R194 Change in bowel habit: Secondary | ICD-10-CM

## 2015-09-23 MED ORDER — DICYCLOMINE HCL 10 MG PO CAPS: ORAL_CAPSULE | ORAL | 11 refills | Status: DC

## 2015-09-23 NOTE — Assessment & Plan Note (Addendum)
WORSENING LOOSE STOOL. DIFFERENTIAL DIAGNOSIS INCLUDES: IBS-D, LACTOSE INTOLERANCE, LESS LIKELY, MICROSCOPIC COLITIS, CELIAC SPRUE, THYROID DISTURBANCE, GIARDIASIS, C DIFF COLITIS, OR IBD.  Follow a DAIRY FREE DIET FOR 4 WEEKS.  HANDOUT GIVEN. TAKE 3 LACTASE PILLS WITH MEALS UP TO THREE TIMES A DAY to prevent diarrhea. TAKE DICYCLOMINE 30 MINUTES PRIOR TO BREAKFAST AND LUNCH and at bedtime. IT MAY CAUSE DROWSINESS, DRY EYES/MOUTH, BLURRY VISION, OR DIFFICULTY URINATING. SUBMIT STOOL STUDIES and blood smaple. Please  CALL IN 3 weeks IF your SYMPTOMS ARE NOT IMPROVED.  FOLLOW UP IN 3 MOS.

## 2015-09-23 NOTE — Patient Instructions (Addendum)
Follow a DAIRY FREE DIET FOR 4 WEEKS. SEE INFO BELOW.  TAKE 3 LACTASE PILLS WITH MEALS UP TO THREE TIMES A DAY to prevent diarrhea.  TAKE DICYCLOMINE 30 MINUTES PRIOR TO BREAKFAST AND LUNCH and at bedtime. IT MAY CAUSE DROWSINESS, DRY EYES/MOUTH, BLURRY VISION, OR DIFFICULTY URINATING.  USE IMODIUM IF NEEDED TO CONTROL LOOSE STOOLS.  SUBMIT STOOL STUDIES and blood smaple.  Please  CALL IN 3 weeks IF your SYMPTOMS ARE NOT IMPROVED.   FOLLOW UP IN 3 MOS.    Lactose Free Diet Lactose is a carbohydrate that is found mainly in milk and milk products, as well as in foods with added milk or whey. Lactose must be digested by the enzyme in order to be used by the body. Lactose intolerance occurs when there is a shortage of lactase. When your body is not able to digest lactose, you may feel sick to your stomach (nausea), bloating, cramping, gas and diarrhea.  There are many dairy products that may be tolerated better than milk by some people:  The use of cultured dairy products such as yogurt, buttermilk, cottage cheese, and sweet acidophilus milk (Kefir) for lactase-deficient individuals is usually well tolerated. This is because the healthy bacteria help digest lactose.   Lactose-hydrolyzed milk (Lactaid) contains 40-90% less lactose than milk and may also be well tolerated.    SPECIAL NOTES  Lactose is a carbohydrates. The major food source is dairy products. Reading food labels is important. Many products contain lactose even when they are not made from milk. Look for the following words: whey, milk solids, dry milk solids, nonfat dry milk powder. Typical sources of lactose other than dairy products include breads, candies, cold cuts, prepared and processed foods, and commercial sauces and gravies.   All foods must be prepared without milk, cream, or other dairy foods.   Soy milk and lactose-free supplements (LACTASE) may be used as an alternative to milk.   FOOD GROUP  ALLOWED/RECOMMENDED AVOID/USE SPARINGLY  BREADS / STARCHES 4 servings or more* Breads and rolls made without milk. [place]French, [place]ienna, or [place] bread. Breads and rolls that contain milk. Prepared mixes such as muffins, biscuits, waffles, pancakes. Sweet rolls, donuts, [place] toast (if made with milk or lactose).  Crackers: Soda crackers, graham crackers. Any crackers prepared without lactose. Zwieback crackers, corn curls, or any that contain lactose.  Cereals: Cooked or dry cereals prepared without lactose (read labels). Cooked or dry cereals prepared with lactose (read labels). Total, Cocoa Krispies. Special K.  Potatoes / Pasta / Rice: Any prepared without milk or lactose. Popcorn. Instant potatoes, frozen Jamaica[place] fries, scalloped or au gratin potatoes.  VEGETABLES 2 servings or more Fresh, frozen, and canned vegetables. Creamed or breaded vegetables. Vegetables in a cheese sauce or with lactose-containing margarines.  FRUIT 2 servings or more All fresh, canned, or frozen fruits that are not processed with lactose. Any canned or frozen fruits processed with lactose.  MEAT & SUBSTITUTES 2 servings or more (4 to 6 oz. total per day) Plain beef, chicken, fish, [place]urkey, lamb, veal, pork, or ham. Kosher prepared meat products. Strained or junior meats that do not contain milk. Eggs, soy meat substitutes, nuts. Scrambled eggs, omelets, and souffles that contain milk. Creamed or breaded meat, fish, or fowl. Sausage products such as wieners, liver sausage, or cold cuts that contain milk solids. Cheese, cottage cheese, or cheese spreads.  MILK None. (See "BEVERAGES" for milk substitutes. See "DESSERTS" for ice cream and frozen desserts.) Milk (whole, 2%, skim, or  chocolate). Evaporated, powdered, or condensed milk; malted milk.  SOUPS & COMBINATION FOODS Bouillon, broth, vegetable soups, clear soups, consomms. Homemade soups made with allowed ingredients. Combination or prepared foods that do not contain  milk or milk products (read labels). Cream soups, chowders, commercially prepared soups containing lactose. Macaroni and cheese, pizza. Combination or prepared foods that contain milk or milk products.  DESSERTS & SWEETS In moderation Water and fruit ices; gelatin; angel food cake. Homemade cookies, pies, or cakes made from allowed ingredients. Pudding (if made with water or a milk substitute). Lactose-free tofu desserts. Sugar, honey, corn syrup, jam, jelly; marmalade; molasses (beet sugar); Pure sugar candy; marshmallows. Ice cream, ice milk, sherbet, custard, pudding, frozen yogurt. Commercial cake and cookie mixes. Desserts that contain chocolate. Pie crust made with milk-containing margarine; reduced-calorie desserts made with a sugar substitute that contains lactose. Toffee, peppermint, butterscotch, chocolate, caramels.  FATS & OILS In moderation Butter (as tolerated; contains very small amounts of lactose). Margarines and dressings that do not contain milk, Vegetable oils, shortening, Miracle Whip, mayonnaise, nondairy cream & whipped toppings without lactose or milk solids added (examples: Coffee Rich, Carnation Coffeemate, Rich's Whipped Topping, PolyRich). Tomasa BlaseBacon. Margarines and salad dressings containing milk; cream, cream cheese; peanut butter with added milk solids, sour cream, chip dips, made with sour cream.  BEVERAGES Carbonated drinks; tea; coffee and freeze-dried coffee; some instant coffees (check labels). Fruit drinks; fruit and vegetable juice; Rice or Soy milk. Ovaltine, hot chocolate. Some cocoas; some instant coffees; instant iced teas; powdered fruit drinks (read labels).   CONDIMENTS / MISCELLANEOUS Soy sauce, carob powder, olives, gravy made with water, baker's cocoa, pickles, pure seasonings and spices, wine, pure monosodium glutamate, catsup, mustard. Some chewing gums, chocolate, some cocoas. Certain antibiotics and vitamin / mineral preparations. Spice blends if they  contain milk products. MSG extender. Artificial sweeteners that contain lactose such as Equal (Nutra-Sweet) and Sweet 'n Low. Some nondairy creamers (read labels).   SAMPLE MENU*  Breakfast   Orange Juice.  Banana.   Bran flakes.   Nondairy Creamer.  Vienna Bread (toasted).   Butter or milk-free margarine.   Coffee or tea.    Noon Meal   Chicken Breast.  Rice.   Green beans.   Butter or milk-free margarine.  Fresh melon.   Coffee or tea.    Evening Meal   Roast Beef.  Baked potato.   Butter or milk-free margarine.   Broccoli.   Lettuce salad with vinegar and oil dressing.  MGM M[employer]el food cake.   Coffee or tea.     .Marland Kitchen

## 2015-09-23 NOTE — Progress Notes (Signed)
Subjective:    Patient ID: Grace Cross, female    DOB: 09/05/1955, 60 y.o.   MRN: [id number]  HPI Losing control of bowels in past 3-4 MOS. OCCASIONALLY FEELS LIKE HAS FEVER/CHILLS. WELL WATER. NO ABX OR TRAVEL. MILK: NO ICE CREAM:2X/ MO CHEESE: VERY OFTEN. WORKS AT [employer]. NO NH OR HOSPITAL VISITS. NO C DIFF CONTACTS. WEIGHT: LOST SOME(104 LBS) AND NOW 99 LBS. 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 BMs: IMODIUM 2-3X/DAY AND W/O IMODIUM: 5-6X/DAY. ACCIDENTS: 1-2X/WEEK-HAPPENS AT NIGHT AND DURING DAY AFTER SHE EATS. CHANGE IN BOWEL IN HABITS,: HAD LOOSE BOWES BUT COULD CONTROL IT. RARE NL FORMED STOOL: LAST WEEK.   RARE SHARP PAIN IN BELLY(@NAVEL ) AND BOTTOM. STRESS: PRETTY  STRESSED. RARE PROBLEMS SWALLOWING: SOLIDS(1-2X/WEEK); FEELS LIKE SHE'S CHOKING. RUSHING TO EAT. HEARTBURN: 1-2X/WEEK. CHEST PAIN: DURING WORK GETS  A TIGHTNESS, AND MAY HAPPEN AT HOME. NO SMOKING-QUIT 10-15 YRS AGO. SOB: WITH LIFTING.  PT DENIES FEVER, CHILLS, HEMATOCHEZIA,  vomiting, OR melena OR CONStipation.  Past Medical History:  Diagnosis Date  . Acid reflux   . Asthma   . Cardiac arrhythmia   . Hypertension     Past Surgical History:  Procedure Laterality Date  . COLONOSCOPY, ESOPHAGOGASTRODUODENOSCOPY (EGD) AND ESOPHAGEAL DILATION N/A 06/17/2012   WUJ:[id number]:TICS IN CECUM/Mod IH/ESO ring/Gastritis  . none      Allergies  Allergen Reactions  . Penicillins Rash   Current Outpatient Prescriptions  Medication Sig Dispense Refill  . albuterol (90 BASE) MCG/ACT inhaler Inhale 2 puffs into the lungs every 6 (six) hours as needed. For shortness of breath    . dicyclomine (BENTYL) 10 MG capsule Take 10 mg by mouth daily as needed for spasms.    Marland Kitchen. escitalopram (LEXAPRO) 10 MG tablet Take 10 mg by mouth every morning.     . Fluticasone-Salmeterol (ADVAIR) 100-50 MCG/DOSE AEPB Inhale 1 puff into the lungs every 12 (twelve) hours.    . methocarbamol (ROBAXIN) 750 MG tablet Take 750 mg  by mouth daily as needed for muscle spasms. For muscle relaxant    . omeprazole (PRILOSEC) 20 MG capsule TAKE ONE CAPSULE BY MOUTH TWICE DAILY BEFORE A MEAL    .      . calcium-vitamin D (OSCAL WITH D) 500-200 MG-UNIT per tablet Take 1 tablet by mouth 3 (three) times daily. (Patient not taking: Reported on 09/23/2015)    . cevimeline (EVOXAC) 30 MG capsule Take 30 mg by mouth 3 (three) times daily. For mouth dryness    . hydroxychloroquine (PLAQUENIL) 200 MG tablet Take 200 mg by mouth daily as needed.  LEG CRAMPS    Review of Systems PER HPI OTHERWISE ALL SYSTEMS ARE NEGATIVE.    Objective:   Physical Exam  Constitutional: She is oriented to person, place, and time. She appears well-developed and well-nourished. No distress.  HENT:  Head: Normocephalic and atraumatic.  Mouth/Throat: Oropharynx is clear and moist. No oropharyngeal exudate.  Eyes: Pupils are equal, round, and reactive to light. No scleral icterus.  Neck: Normal range of motion. Neck supple.  Cardiovascular: Normal rate, regular rhythm and normal heart sounds.   Pulmonary/Chest: Effort normal and breath sounds normal. No respiratory distress.  Abdominal: Soft. Bowel sounds are normal. She exhibits no distension. There is tenderness. There is no rebound and no guarding.  MILD TTP IN BILATERAL PERIUMBILICAL REGION  Musculoskeletal: She exhibits no edema.  Lymphadenopathy:    She has no cervical adenopathy.  Neurological: She is alert and oriented to  person, place, and time.  NO FOCAL DEFICITS  Psychiatric: She has a normal mood and affect.  Vitals reviewed.     Assessment & Plan:

## 2015-09-27 NOTE — Progress Notes (Signed)
CC'ED TO PCP 

## 2015-09-27 NOTE — Progress Notes (Signed)
ON RECALL  °

## 2015-11-11 ENCOUNTER — Encounter: Payer: Self-pay | Admitting: Gastroenterology

## 2015-12-24 ENCOUNTER — Other Ambulatory Visit: Payer: Self-pay | Admitting: Gastroenterology

## 2016-10-07 ENCOUNTER — Other Ambulatory Visit: Payer: Self-pay | Admitting: Gastroenterology

## 2016-10-09 ENCOUNTER — Other Ambulatory Visit: Payer: Self-pay

## 2016-10-10 MED ORDER — OMEPRAZOLE 20 MG PO CPDR: DELAYED_RELEASE_CAPSULE | ORAL | 5 refills | Status: DC

## 2016-10-11 ENCOUNTER — Encounter: Payer: Self-pay | Admitting: Gastroenterology

## 2016-10-11 ENCOUNTER — Ambulatory Visit (INDEPENDENT_AMBULATORY_CARE_PROVIDER_SITE_OTHER): Payer: BLUE CROSS/BLUE SHIELD | Admitting: Gastroenterology

## 2016-10-11 DIAGNOSIS — R1310 Dysphagia, unspecified: Secondary | ICD-10-CM

## 2016-10-11 DIAGNOSIS — K580 Irritable bowel syndrome with diarrhea: Secondary | ICD-10-CM | POA: Diagnosis not present

## 2016-10-11 DIAGNOSIS — R1319 Other dysphagia: Secondary | ICD-10-CM

## 2016-10-11 DIAGNOSIS — R634 Abnormal weight loss: Secondary | ICD-10-CM

## 2016-10-11 MED ORDER — ELUXADOLINE 75 MG PO TABS: 75.0000 mg | ORAL_TABLET | Freq: Two times a day (BID) | ORAL | 5 refills | Status: DC

## 2016-10-11 NOTE — Progress Notes (Signed)
cc'ed to pcp °

## 2016-10-11 NOTE — Progress Notes (Signed)
   Subjective:    Patient ID: Grace Cross, female    DOB: 06-01-1955, 61 y.o.   MRN: [id number] Grace Cross, Grace Z, MD  HPI Bowels were constipated. #4 when it did come out. ATE A HOT DOG WITH CHILI. USING BENTYL: ONCE. IMODIUM: ONCE OR TWICE A DAY. AVOIDING DAIRY: NO. HEARTBURN EVER NOW AND THEN. MAY GET CHOKED IF DOESN'T CHOP. RARE CRAMPS: LOWER(1-2X/WEEK). GETS BETTER WITH PASSING GAS OR HAVING A BM. HAS MOR E DIARRHEA THAN CONSTIPATION. MILK: NONE. CHEESE: 1-2X/WEEK. ICE CREAM: < 1X/WK. MAY HAVE GAGGING IF SOMETHING FEELS STUCK. HAS ASTHMA AND SOB OR WORSE. LACTOSE FREE MILK IS HIGH. HAS AN APPT WITH DR. HALL.   PT DENIES FEVER, CHILLS, HEMATOCHEZIA, HEMATEMESIS, vomiting, melena, diarrhea, CHEST PAIN, OR CHANGE IN BOWEL IN HABITS.  Past Medical History:  Diagnosis Date  . Acid reflux   . Asthma   . Cardiac arrhythmia   . Hypertension    Past Surgical History:  Procedure Laterality Date  . COLONOSCOPY, ESOPHAGOGASTRODUODENOSCOPY (EGD) AND ESOPHAGEAL DILATION N/A 06/17/2012   WUJ:WJXBSLF:TICS IN CECUM/Mod IH/ESO ring/Gastritis  . none     Allergies  Allergen Reactions  . Penicillins Rash   Current Outpatient Prescriptions  Medication Sig Dispense Refill  . albuterol (PROAIR HFA) 108 (90 BASE) MCG/ACT inhaler Inhale 2 puffs into the lungs every 6 (six) hours as needed. For shortness of breath    . calcium-vitamin D (OSCAL WITH D) 500-200 MG-UNIT per tablet Take 1 tablet by mouth 3 (three) times daily. (Patient taking differently: Take 1 tablet by mouth daily. )    . Fluticasone-Salmeterol (ADVAIR) 100-50 MCG/DOSE AEPB Inhale 1 puff into the lungs as needed.     Marland Kitchen. DAILY VITAMIN.     .      .      .      .            .       Review of Systems PER HPI OTHERWISE ALL SYSTEMS ARE NEGATIVE.    Objective:   Physical Exam  Constitutional: She is oriented to person, place, and time. She appears well-developed and well-nourished. No distress.  HENT:  Head: Normocephalic and atraumatic.    Mouth/Throat: Oropharynx is clear and moist. No oropharyngeal exudate.  EDENTULOUS  Eyes: Pupils are equal, round, and reactive to light. No scleral icterus.  Neck: Normal range of motion. Neck supple.  Cardiovascular: Normal rate, regular rhythm and normal heart sounds.   Pulmonary/Chest: Effort normal and breath sounds normal. No respiratory distress.  Abdominal: Soft. Bowel sounds are normal. She exhibits no distension. There is tenderness. There is no rebound and no guarding.  MILD TTP x4  Musculoskeletal: She exhibits no edema.  Lymphadenopathy:    She has no cervical adenopathy.  Neurological: She is alert and oriented to person, place, and time.  NO  NEW FOCAL DEFICITS  Psychiatric: She has a normal mood and affect.  Vitals reviewed.         Assessment & Plan:

## 2016-10-11 NOTE — Assessment & Plan Note (Signed)
WEIGHT DOWN 5 LBS SINCE 2017.  CONTINUE TO MONITOR SYMPTOMS.

## 2016-10-11 NOTE — Assessment & Plan Note (Signed)
SYMPTOMS NOT IDEALLY CONTROLLED AND EXACERBATED BY LACTOSE INTOLERANCE.  STOP dicyclomine. TRY VIBERZI. TAKE ONE TABLET once daily for 7 days. Continue daily if BMs are controlled if not,  then increase to TWICE DAILY TO CONTROL DIARRHEA. USE IMODIUM 1 OR 2 TIMES A DAY IF NEEDED. SAMPLES #8 GIVEN/COPAY CARD/RX FOR 6 MOS. DO NOT USE LOTRENEX, LEVSIN, OR BENTYL WITH VIBERZI. YOU MUST CALL IF YOU GO MORE THAN 4 DAYS WITHOUT A BOWEL MOVEMENT OR YOU DEVELOP INCREASE IN NAUSEA, VOMITING, OR ABDOMINAL PAIN. DRINK WATER TO KEEP YOUR URINE LIGHT YELLOW. AVOID DAIRY OR IF YOU CONSUME DAIRY, ADD LACTASE 3 PILLS WITH MEALS UP TO THREE TIMES A DAY.  FOLLOW UP IN 4 MOS.

## 2016-10-11 NOTE — Assessment & Plan Note (Signed)
SYMPTOMS FAIRLY WELL CONTROLLED UNLESS SHE EATS LARGE FOOD BOLUS.  CONTINUE TO MONITOR SYMPTOMS. ENCOURAGED TO ADHERE TO SOFT MECHANICAL DIET. FOLLOW UP IN 6 MOS.

## 2016-10-11 NOTE — Progress Notes (Signed)
ON RECALL  °

## 2016-10-11 NOTE — Patient Instructions (Addendum)
STOP dicyclomine. TRY VIBERZI. TAKE ONE TABLET once daily for 7 days. Continue daily if BMs are controlled if not,  then increase to TWICE DAILY TO CONTROL DIARRHEA. USE IMODIUM 1 OR 2 TIMES A DAY IF NEEDED.  DO NOT USE LOTRENEX, LEVSIN, OR BENTYL WITH VIBERZI.  YOU MUST CALL IF YOU GO MORE THAN 4 DAYS WITHOUT A BOWEL MOVEMENT OR YOU DEVELOP INCREASE IN NAUSEA, VOMITING, OR ABDOMINAL PAIN.  DRINK WATER TO KEEP YOUR URINE LIGHT YELLOW.  AVOID DAIRY OR IF YOU CONSUME DAIRY, ADD LACTASE 3 PILLS WITH MEALS UP TO THREE TIMES A DAY.   FOLLOW UP IN 4 MOS.

## 2017-01-10 ENCOUNTER — Encounter: Payer: Self-pay | Admitting: Gastroenterology

## 2017-01-24 ENCOUNTER — Encounter (HOSPITAL_COMMUNITY): Payer: Self-pay | Admitting: Emergency Medicine

## 2017-01-24 ENCOUNTER — Emergency Department (HOSPITAL_COMMUNITY): Payer: BLUE CROSS/BLUE SHIELD

## 2017-01-24 ENCOUNTER — Emergency Department (HOSPITAL_COMMUNITY)
Admission: EM | Admit: 2017-01-24 | Discharge: 2017-01-24 | Disposition: A | Discharge status: Home / Self Care | Payer: BLUE CROSS/BLUE SHIELD | Attending: Emergency Medicine | Admitting: Emergency Medicine

## 2017-01-24 ENCOUNTER — Other Ambulatory Visit: Payer: Self-pay

## 2017-01-24 DIAGNOSIS — Y9389 Activity, other specified: Secondary | ICD-10-CM | POA: Diagnosis not present

## 2017-01-24 DIAGNOSIS — S161XXA Strain of muscle, fascia and tendon at neck level, initial encounter: Secondary | ICD-10-CM | POA: Diagnosis not present

## 2017-01-24 DIAGNOSIS — J45909 Unspecified asthma, uncomplicated: Secondary | ICD-10-CM | POA: Insufficient documentation

## 2017-01-24 DIAGNOSIS — M5030 Other cervical disc degeneration, unspecified cervical region: Secondary | ICD-10-CM | POA: Insufficient documentation

## 2017-01-24 DIAGNOSIS — I10 Essential (primary) hypertension: Secondary | ICD-10-CM | POA: Diagnosis not present

## 2017-01-24 DIAGNOSIS — Z87891 Personal history of nicotine dependence: Secondary | ICD-10-CM | POA: Insufficient documentation

## 2017-01-24 DIAGNOSIS — Y92000 Kitchen of unspecified non-institutional (private) residence as  the place of occurrence of the external cause: Secondary | ICD-10-CM | POA: Insufficient documentation

## 2017-01-24 DIAGNOSIS — S0990XA Unspecified injury of head, initial encounter: Secondary | ICD-10-CM | POA: Diagnosis present

## 2017-01-24 DIAGNOSIS — W0110XA Fall on same level from slipping, tripping and stumbling with subsequent striking against unspecified object, initial encounter: Secondary | ICD-10-CM | POA: Diagnosis not present

## 2017-01-24 DIAGNOSIS — Z79899 Other long term (current) drug therapy: Secondary | ICD-10-CM | POA: Insufficient documentation

## 2017-01-24 DIAGNOSIS — S0003XA Contusion of scalp, initial encounter: Secondary | ICD-10-CM | POA: Insufficient documentation

## 2017-01-24 DIAGNOSIS — Y998 Other external cause status: Secondary | ICD-10-CM | POA: Insufficient documentation

## 2017-01-24 LAB — URINALYSIS, ROUTINE W REFLEX MICROSCOPIC
Bacteria, UA: NONE SEEN
Bilirubin Urine: NEGATIVE
GLUCOSE, UA: NEGATIVE mg/dL
Ketones, ur: NEGATIVE mg/dL
NITRITE: NEGATIVE
PH: 5 (ref 5.0–8.0)
Protein, ur: 30 mg/dL — AB
SPECIFIC GRAVITY, URINE: 1.024 (ref 1.005–1.030)

## 2017-01-24 MED ORDER — CYCLOBENZAPRINE HCL 10 MG PO TABS
10.0000 mg | ORAL_TABLET | Freq: Once | ORAL | Status: AC
Start: 2017-01-24 — End: 2017-01-24
  Administered 2017-01-24: 10 mg via ORAL
  Filled 2017-01-24: qty 1

## 2017-01-24 MED ORDER — METHOCARBAMOL 500 MG PO TABS: 500.0000 mg | ORAL_TABLET | Freq: Three times a day (TID) | ORAL | 0 refills | Status: DC

## 2017-01-24 MED ORDER — HYDROCODONE-ACETAMINOPHEN 5-325 MG PO TABS: 2.0000 | ORAL_TABLET | Freq: Once | ORAL | Status: DC

## 2017-01-24 MED ORDER — HYDROCODONE-ACETAMINOPHEN 5-325 MG PO TABS
2.0000 | ORAL_TABLET | Freq: Once | ORAL | Status: AC
  Administered 2017-01-24: 2 via ORAL
  Filled 2017-01-24: qty 2

## 2017-01-24 MED ORDER — HYDROCODONE-ACETAMINOPHEN 5-325 MG PO TABS: 1.0000 | ORAL_TABLET | ORAL | 0 refills | Status: DC | PRN

## 2017-01-24 MED ORDER — ONDANSETRON HCL 4 MG PO TABS
4.0000 mg | ORAL_TABLET | Freq: Once | ORAL | Status: AC
  Administered 2017-01-24: 4 mg via ORAL
  Filled 2017-01-24: qty 1

## 2017-01-24 MED ORDER — ONDANSETRON HCL 4 MG PO TABS: 4.0000 mg | ORAL_TABLET | Freq: Once | ORAL | Status: DC

## 2017-01-24 NOTE — ED Provider Notes (Signed)
[facility] EMERGENCY DEPARTMENT Provider Note   CSN: [id number] Arrival date & time: 01/24/17  1055     History   Chief Complaint Chief Complaint  Patient presents with  . Fall    HPI Grace Cross is a 61 y.o. female.  Patient is a 61 year old female who presents to the emergency department with a complaint of injury following a fall.  The patient states that on yesterday December 25 she was reaching into the refrigerator when she fell backwards and hit her head, injured her left forearm, and injured her left lower abdomen.     The history is provided by the patient.  Fall  Associated symptoms include headaches. Pertinent negatives include no chest pain, no abdominal pain and no shortness of breath.    Past Medical History:  Diagnosis Date  . Acid reflux   . Asthma   . Cardiac arrhythmia   . Hypertension     Patient Active Problem List   Diagnosis Date Noted  . Odynophagia 08/15/2012  . Esophageal dysphagia 06/12/2012  . IBS (irritable bowel syndrome) 06/12/2012  . Loss of weight 06/12/2012    Past Surgical History:  Procedure Laterality Date  . COLONOSCOPY, ESOPHAGOGASTRODUODENOSCOPY (EGD) AND ESOPHAGEAL DILATION N/A 06/17/2012   [id number]:TICS IN CECUM/Mod IH/ESO ring/Gastritis  . none      OB History    No data available       Home Medications    Prior to Admission medications   Medication Sig Start Date End Date Taking? Authorizing Provider  albuterol (PROAIR HFA) 108 (90 BASE) MCG/ACT inhaler Inhale 2 puffs into the lungs every 6 (six) hours as needed. For shortness of breath    [provider]  azithromycin (ZITHROMAX) 250 MG tablet Take 250-500 mg by mouth See admin instructions. Take two tablets by mouth on day 1 then take one tablet on days 2 through 5    [provider]  calcium-vitamin D (OSCAL WITH D) 500-200 MG-UNIT per tablet Take 1 tablet by mouth 3 (three) times daily. Patient taking differently: Take 1 tablet by mouth  daily.  09/11/14   Donnetta Hutchingook, Brian, MD  cevimeline [facility](EVOXAC) 30 MG capsule Take 30 mg by mouth 3 (three) times daily. For mouth dryness    [provider]  dicyclomine (BENTYL) 10 MG capsule 1 PO 30 MIN PRIOR TO BREAKFAST AND LUNCH AND AT BEDTIME. Patient not taking: Reported on 10/11/2016 09/23/15   West BaliFields, Sandi L, MD  Eluxadoline (VIBERZI) 75 MG TABS Take 75 mg by mouth 2 (two) times daily. 10/11/16   Fields, Darleene CleaverSandi L, MD  escitalopram (LEXAPRO) 10 MG tablet Take 10 mg by mouth every morning.     [provider]  Fluticasone-Salmeterol (ADVAIR) 100-50 MCG/DOSE AEPB Inhale 1 puff into the lungs as needed.     [provider]  hydroxychloroquine (PLAQUENIL) 200 MG tablet Take 200 mg by mouth daily as needed.  06/05/13   [provider]  methocarbamol (ROBAXIN) 750 MG tablet Take 750 mg by mouth daily as needed for muscle spasms. For muscle relaxant 04/10/13   [provider]  omeprazole (PRILOSEC) 20 MG capsule TAKE ONE CAPSULE BY MOUTH TWICE DAILY BEFORE A MEAL Patient not taking: Reported on 10/11/2016 10/10/16   Anice PaganiniGill, Eric A, NP    Family History Family History  Problem Relation Age of Onset  . Colon cancer Neg Hx   . Inflammatory bowel disease Neg Hx     Social History Social History   Tobacco Use  .  Smoking status: Former Smoker    Types: Cigarettes  . Smokeless tobacco: Never Used  . Tobacco comment: Quit x 15-20 years  Substance Use Topics  . Alcohol use: Yes    Comment: occ  . Drug use: No     Allergies   Penicillins   Review of Systems Review of Systems  Constitutional: Negative for activity change.       All ROS Neg except as noted in HPI  HENT: Negative for nosebleeds.   Eyes: Negative for photophobia and discharge.  Respiratory: Negative for cough, shortness of breath and wheezing.   Cardiovascular: Negative for chest pain and palpitations.  Gastrointestinal: Negative for abdominal pain and blood in stool.  Genitourinary:  Negative for dysuria, frequency and hematuria.  Musculoskeletal: Positive for arthralgias. Negative for back pain, gait problem and neck pain.  Skin: Negative.   Neurological: Positive for light-headedness and headaches. Negative for dizziness, tremors, seizures, syncope, speech difficulty, weakness and numbness.  Psychiatric/Behavioral: Negative for confusion and hallucinations.     Physical Exam Updated Vital Signs BP (!) 138/95 (BP Location: Right Arm)   Pulse 62   Temp 98.6 F (37 C) (Oral)   Resp 16   Ht 5\' 1"  (1.549 m)   Wt 45.4 kg (100 lb)   SpO2 100%   BMI 18.89 kg/m   Physical Exam  Constitutional: She appears well-developed and well-nourished. No distress.  HENT:  Head: Normocephalic and atraumatic.    Right Ear: External ear normal.  Left Ear: External ear normal.  Eyes: Conjunctivae are normal. Right eye exhibits no discharge. Left eye exhibits no discharge. No scleral icterus.  Neck: Neck supple. No tracheal deviation present.  Cardiovascular: Normal rate, regular rhythm and intact distal pulses.  Pulmonary/Chest: Effort normal and breath sounds normal. No stridor. No respiratory distress. She has no wheezes. She has no rales.  Abdominal: Soft. Bowel sounds are normal. She exhibits no distension. There is no tenderness. There is no rebound and no guarding.  Musculoskeletal: She exhibits no edema.       Cervical back: She exhibits tenderness and spasm. She exhibits no deformity.  Neurological: She is alert. She has normal strength. No cranial nerve deficit (no facial droop, extraocular movements intact, no slurred speech) or sensory deficit. She exhibits normal muscle tone. She displays no seizure activity. Coordination normal.  Skin: Skin is warm and dry. No rash noted.  Psychiatric: She has a normal mood and affect.  Nursing note and vitals reviewed.    ED Treatments / Results  Labs (all labs ordered are listed, but only abnormal results are displayed) Labs  Reviewed  URINALYSIS, ROUTINE W REFLEX MICROSCOPIC    EKG  EKG Interpretation None       Radiology Dg Hip Unilat W Or Wo Pelvis 2-3 Views Left  Result Date: 01/24/2017 CLINICAL DATA:  Larey Seat yesterday, left hip pain. EXAM: DG HIP (WITH OR WITHOUT PELVIS) 2-3V LEFT COMPARISON:  None. FINDINGS: Single view of the pelvis and two views of the left hip are provided. No osseous fracture or dislocation seen. Bone mineralization is normal. No significant degenerative change. Prominent calcifications within the central pelvis, most likely calcified uterine fibroids. Soft tissues about the pelvis and left hip are otherwise unremarkable. IMPRESSION: No acute findings.  No osseous fracture or dislocation. Electronically Signed   By: Bary Richard M.D.   On: 01/24/2017 17:26    Procedures Procedures (including critical care time)  Medications Ordered in ED Medications - No data to display  Initial Impression / Assessment and Plan / ED Course  I have reviewed the triage vital signs and the nursing notes.  Pertinent labs & imaging results that were available during my care of the patient were reviewed by me and considered in my medical decision making (see chart for details).       Final Clinical Impressions(s) / ED Diagnoses MDM Blood pressure has been fluctuating during the emergency department visit.  The patient states this is not new, and her doctor is working on trying to get her pressure regulated. Patient in no distress.  She does complain of some headache and some pain in her lower pelvis area.  The patient however is ambulatory without significant problems.  Urine analysis shows a hazy yellow specimen with a specific gravity 1.024.  There is moderate blood on the dipstick.  There is 30 mg/dL of protein present.  The leukocytes are small there are too numerous to count red blood cells and 6-30 white blood cells.  There are calcium oxalate crystals present as well as hyaline casts  present.  A culture of the urine has been sent to the lab.  The patient has been encouraged to increase her water and fluids.  CT scan of the brain shows no evidence of acute intracranial abnormality.  No skull fracture noted.  Ex CT of the cervical spine show some straightening of the cervical spine, but no other significant abnormality.  There is some apical emphysema seen also on the CT.  There is degenerative disc disease at C5-C6 and C6-C7.  I discussed the findings with the patient in terms which she understands.  I have answered questions.  Patient will be prescribed Robaxin and Norco.  The patient will use Tylenol extra strength for mild pain.  I have asked the patient to see her primary physician as soon as possible.  Patient is in agreement with this plan.  The patient will will return to the emergency department if any changes, problems, or concerns.   Final diagnoses:  Contusion of occipital region of scalp, initial encounter  Strain of neck muscle, initial encounter  DDD (degenerative disc disease), cervical    ED Discharge Orders        Ordered    methocarbamol (ROBAXIN) 500 MG tablet  3 times daily     01/24/17 1921    HYDROcodone-acetaminophen (NORCO/VICODIN) 5-325 MG tablet  Every 4 hours PRN     01/24/17 1921       Ivery QualeBryant, Anella Nakata, PA-C 01/24/17 1931    Donnetta Hutchingook, Brian, MD 01/28/17 [phone]

## 2017-01-24 NOTE — Discharge Instructions (Signed)
Your blood pressure is elevated tonight.  Please have your doctor to recheck this soon.  The x-ray of your pelvis is negative for fracture or dislocation or other problem.  I feel that the pain in your left lower pelvis area is probably muscle strain.  The CT scan of your head is negative for skull fracture or brain injury.  The CT scan of your neck is negative for fracture or dislocation or acute spine problem.  Please use Tylenol every 4 hours for mild pain, please use Norco for more severe pain.  Please use Robaxin 3 times daily for spasm pain.  Norco and Robaxin may cause drowsiness, please use these medications with caution.  Please make your physician aware of the medications tonight in the emergency department.  Your urine tests suggest that you need more water intake.  A culture of your urine has been sent to the lab.  If there is evidence of an infection, someone will call you with the proper medications to use.

## 2017-01-24 NOTE — ED Triage Notes (Signed)
Fell yesterday (@1330 )while bending over to get drinks from refrigerator and fell backward.  Did hit back head and chair hit left forearm.  Denies LOC.  C/o headache and pain to left lower abdomen.

## 2017-01-26 LAB — URINE CULTURE
CULTURE: NO GROWTH
Special Requests: NORMAL

## 2017-03-01 ENCOUNTER — Ambulatory Visit: Payer: BLUE CROSS/BLUE SHIELD | Admitting: Gastroenterology

## 2017-03-05 ENCOUNTER — Ambulatory Visit: Payer: BLUE CROSS/BLUE SHIELD | Admitting: Orthopedic Surgery

## 2017-03-05 ENCOUNTER — Encounter: Payer: Self-pay | Admitting: Orthopaedic Surgery

## 2017-03-07 ENCOUNTER — Ambulatory Visit: Payer: BLUE CROSS/BLUE SHIELD | Admitting: Orthopaedic Surgery

## 2017-03-07 ENCOUNTER — Encounter: Payer: Self-pay | Admitting: Orthopaedic Surgery

## 2017-03-07 VITALS — BP 83/57 | HR 88 | Temp 97.3°F | Ht 61.0 in | Wt 100.0 lb

## 2017-03-07 DIAGNOSIS — M79671 Pain in right foot: Secondary | ICD-10-CM | POA: Diagnosis not present

## 2017-03-07 DIAGNOSIS — S96911A Strain of unspecified muscle and tendon at ankle and foot level, right foot, initial encounter: Secondary | ICD-10-CM | POA: Diagnosis not present

## 2017-03-07 NOTE — Progress Notes (Signed)
Subjective:    Patient ID: Grace Cross, female    DOB: Jun 08, 1955, 62 y.o.   MRN: [id number]  HPI She fell and hurt her right foot and ankle on 02-28-17.  She was seen at [facility] ER.  X-rays were done and were negative of the right foot, right ankle and knee.  She has been using a post op shoe. She has no other injury.  She complains of hip pain some.  I have reviewed the ER report and records, x-rays and x-rays reports.  She has continued swelling and pain.  She has been unable to work.  Review of Systems  Respiratory: Positive for shortness of breath.   Musculoskeletal: Positive for arthralgias, gait problem and joint swelling.  All other systems reviewed and are negative.  Past Medical History:  Diagnosis Date  . Acid reflux   . Asthma   . Cardiac arrhythmia   . Hypertension     Past Surgical History:  Procedure Laterality Date  . COLONOSCOPY, ESOPHAGOGASTRODUODENOSCOPY (EGD) AND ESOPHAGEAL DILATION N/A 06/17/2012   JXB:JYNW IN CECUM/Mod IH/ESO ring/Gastritis  . none      Current Outpatient Medications on File Prior to Visit  Medication Sig Dispense Refill  . ADVAIR DISKUS 250-50 MCG/DOSE AEPB     . albuterol (PROAIR HFA) 108 (90 BASE) MCG/ACT inhaler Inhale 2 puffs into the lungs every 6 (six) hours as needed. For shortness of breath    . BYSTOLIC 20 MG TABS     . calcium-vitamin D (OSCAL WITH D) 500-200 MG-UNIT per tablet Take 1 tablet by mouth 3 (three) times daily. (Patient taking differently: Take 1 tablet by mouth daily. ) 90 tablet 0  . Eluxadoline (VIBERZI) 75 MG TABS Take 75 mg by mouth 2 (two) times daily. 60 tablet 5  . escitalopram (LEXAPRO) 10 MG tablet Take 10 mg by mouth every morning.     Marland Kitchen ibuprofen (ADVIL,MOTRIN) 800 MG tablet     . meloxicam (MOBIC) 7.5 MG tablet     . omeprazole (PRILOSEC) 20 MG capsule TAKE ONE CAPSULE BY MOUTH TWICE DAILY BEFORE A MEAL 60 capsule 5  . traMADol (ULTRAM) 50 MG tablet     . cevimeline (EVOXAC) 30 MG capsule  Take 30 mg by mouth 3 (three) times daily. For mouth dryness    . dicyclomine (BENTYL) 10 MG capsule 1 PO 30 MIN PRIOR TO BREAKFAST AND LUNCH AND AT BEDTIME. (Patient not taking: Reported on 10/11/2016) 90 capsule 11  . HYDROcodone-acetaminophen (NORCO/VICODIN) 5-325 MG tablet Take 1 tablet by mouth every 4 (four) hours as needed. Take with food. (Patient not taking: Reported on 03/07/2017) 12 tablet 0  . hydroxychloroquine (PLAQUENIL) 200 MG tablet Take 200 mg by mouth daily as needed.     . methocarbamol (ROBAXIN) 500 MG tablet Take 1 tablet (500 mg total) by mouth 3 (three) times daily. (Patient not taking: Reported on 03/07/2017) 21 tablet 0   No current facility-administered medications on file prior to visit.     Social History   Socioeconomic History  . Marital status: Single    Spouse name: Not on file  . Number of children: 1  . Years of education: Not on file  . Highest education level: Not on file  Social Needs  . Financial resource strain: Not on file  . Food insecurity - worry: Not on file  . Food insecurity - inability: Not on file  . Transportation needs - medical: Not on file  . Transportation needs -  non-medical: Not on file  Occupational History  . Occupation: Location managermachine operator    Employer: [employer]  Tobacco Use  . Smoking status: Former Smoker    Types: Cigarettes  . Smokeless tobacco: Never Used  . Tobacco comment: Quit x 15-20 years  Substance and Sexual Activity  . Alcohol use: Yes    Comment: occ  . Drug use: No  . Sexual activity: Not on file  Other Topics Concern  . Not on file  Social History Narrative  . Not on file    Family History  Problem Relation Age of Onset  . Colon cancer Neg Hx   . Inflammatory bowel disease Neg Hx     BP (!) 83/57   Pulse 88   Temp (!) 97.3 F (36.3 C)   Ht 5\' 1"  (1.549 m)   Wt 100 lb (45.4 kg)   BMI 18.89 kg/m       Objective:   Physical Exam  Constitutional: She is oriented to person, place, and time. She  appears well-developed and well-nourished.  HENT:  Head: Normocephalic and atraumatic.  Eyes: Conjunctivae and EOM are normal. Pupils are equal, round, and reactive to light.  Neck: Normal range of motion. Neck supple.  Cardiovascular: Normal rate, regular rhythm and intact distal pulses.  Pulmonary/Chest: Effort normal.  Abdominal: Soft.  Musculoskeletal: She exhibits tenderness (Right foot with swelling and pain mid foot.  NV intact. Ankle with diffuse tenderness and slight swelling, ROM is full but tender.  Limp to the right.  Right knee with full ROM, no effusion, right hip negative.).  Neurological: She is alert and oriented to person, place, and time. She displays normal reflexes. No cranial nerve deficit. She exhibits normal muscle tone. Coordination normal.  Skin: Skin is warm and dry.  Psychiatric: She has a normal mood and affect. Her behavior is normal. Judgment and thought content normal.  Vitals reviewed.         Assessment & Plan:   Encounter Diagnoses  Name Primary?  . Right foot pain Yes  . Strain of right ankle, initial encounter    I have given instructions for Contrast Baths.  Elevate, ice, use the post op shoe.  Stay out of work.  Return in one week.  Aleve one po bid pc.  Call if any problem.  Precautions discussed.   Electronically Signed Darreld McleanWayne Paisley Grajeda, MD 2/6/20198:27 AM

## 2017-03-07 NOTE — Patient Instructions (Addendum)
Contrast bath sheet given to the patient.  OUT OF WORK this week.

## 2017-03-13 ENCOUNTER — Telehealth: Payer: Self-pay | Admitting: Orthopaedic Surgery

## 2017-03-13 NOTE — Telephone Encounter (Signed)
Called patient following message received from her employer's contact, [employer], regarding short-term disability forms - ph#[phone], ext [phone].  Relayed to patient we can review forms process with her and with daughter prior to tomorrow's appointment - said will have her daughter call us.

## 2017-03-14 ENCOUNTER — Ambulatory Visit (INDEPENDENT_AMBULATORY_CARE_PROVIDER_SITE_OTHER): Payer: BLUE CROSS/BLUE SHIELD | Admitting: Orthopaedic Surgery

## 2017-03-14 ENCOUNTER — Encounter: Payer: Self-pay | Admitting: Orthopaedic Surgery

## 2017-03-14 VITALS — BP 80/60 | HR 71 | Temp 97.5°F | Ht 61.0 in | Wt 100.0 lb

## 2017-03-14 DIAGNOSIS — S96911D Strain of unspecified muscle and tendon at ankle and foot level, right foot, subsequent encounter: Secondary | ICD-10-CM

## 2017-03-14 DIAGNOSIS — M79671 Pain in right foot: Secondary | ICD-10-CM

## 2017-03-14 NOTE — Progress Notes (Signed)
Patient VW:UJWJX Grace Cross, Cross DOB:26-May-1955, 62 y.o. BJY:[id number]  Chief Complaint  Patient presents with  . Foot Pain    right     HPI  Grace Cross is a 62 y.o. Cross who has pain of the right foot and ankle.  She is slightly better.  She has less pain and less swelling.  She is still limping. She has been using the post op shoe and the soaks.  I feel she should be able to return to work in about 10 days.  Note given to return 03-26-17, a Monday. HPI  Body mass index is 18.89 kg/m.  ROS  Review of Systems  Respiratory: Positive for shortness of breath.   Musculoskeletal: Positive for arthralgias, gait problem and joint swelling.  All other systems reviewed and are negative.   Past Medical History:  Diagnosis Date  . Acid reflux   . Asthma   . Cardiac arrhythmia   . Hypertension     Past Surgical History:  Procedure Laterality Date  . COLONOSCOPY, ESOPHAGOGASTRODUODENOSCOPY (EGD) AND ESOPHAGEAL DILATION N/A 06/17/2012   YQM:VHQI IN CECUM/Mod IH/ESO ring/Gastritis  . none      Family History  Problem Relation Age of Onset  . Colon cancer Neg Hx   . Inflammatory bowel disease Neg Hx     Social History Social History   Tobacco Use  . Smoking status: Former Smoker    Types: Cigarettes  . Smokeless tobacco: Never Used  . Tobacco comment: Quit x 15-20 years  Substance Use Topics  . Alcohol use: Yes    Comment: occ  . Drug use: No    Allergies  Allergen Reactions  . Penicillins Rash    Current Outpatient Medications  Medication Sig Dispense Refill  . ADVAIR DISKUS 250-50 MCG/DOSE AEPB     . albuterol (PROAIR HFA) 108 (90 BASE) MCG/ACT inhaler Inhale 2 puffs into the lungs every 6 (six) hours as needed. For shortness of breath    . BYSTOLIC 20 MG TABS     . calcium-vitamin D (OSCAL WITH D) 500-200 MG-UNIT per tablet Take 1 tablet by mouth 3 (three) times daily. (Patient taking differently: Take 1 tablet by mouth daily. ) 90 tablet 0  . cevimeline  (EVOXAC) 30 MG capsule Take 30 mg by mouth 3 (three) times daily. For mouth dryness    . Eluxadoline (VIBERZI) 75 MG TABS Take 75 mg by mouth 2 (two) times daily. 60 tablet 5  . escitalopram (LEXAPRO) 10 MG tablet Take 10 mg by mouth every morning.     . hydroxychloroquine (PLAQUENIL) 200 MG tablet Take 200 mg by mouth daily as needed.     Marland Kitchen ibuprofen (ADVIL,MOTRIN) 800 MG tablet     . meloxicam (MOBIC) 7.5 MG tablet     . omeprazole (PRILOSEC) 20 MG capsule TAKE ONE CAPSULE BY MOUTH TWICE DAILY BEFORE A MEAL 60 capsule 5  . traMADol (ULTRAM) 50 MG tablet     . dicyclomine (BENTYL) 10 MG capsule 1 PO 30 MIN PRIOR TO BREAKFAST AND LUNCH AND AT BEDTIME. (Patient not taking: Reported on 10/11/2016) 90 capsule 11  . HYDROcodone-acetaminophen (NORCO/VICODIN) 5-325 MG tablet Take 1 tablet by mouth every 4 (four) hours as needed. Take with food. (Patient not taking: Reported on 03/07/2017) 12 tablet 0  . methocarbamol (ROBAXIN) 500 MG tablet Take 1 tablet (500 mg total) by mouth 3 (three) times daily. (Patient not taking: Reported on 03/14/2017) 21 tablet 0   No current facility-administered medications for this  visit.      Physical Exam  Blood pressure (!) 80/60, pulse 71, temperature (!) 97.5 F (36.4 C), height 5\' 1"  (1.549 m), weight 100 lb (45.4 kg).  Constitutional: overall normal hygiene, normal nutrition, well developed, normal grooming, normal body habitus. Assistive device:post op shoe  Musculoskeletal: gait and station Limp right, muscle tone and strength are normal, no tremors or atrophy is present.  .  Neurological: coordination overall normal.  Deep tendon reflex/nerve stretch intact.  Sensation normal.  Cranial nerves II-XII intact.   Skin:   Normal overall no scars, lesions, ulcers or rashes. No psoriasis.  Psychiatric: Alert and oriented x 3.  Recent memory intact, remote memory unclear.  Normal mood and affect. Well groomed.  Good eye contact.  Cardiovascular: overall no  swelling, no varicosities, no edema bilaterally, normal temperatures of the legs and arms, no clubbing, cyanosis and good capillary refill.  Lymphatic: palpation is normal.  The is slight lateral swelling of the right ankle. ROM is full. NV intact.  The right foot has no swelling today.   All other systems reviewed and are negative   The patient has been educated about the nature of the problem(s) and counseled on treatment options.  The patient appeared to understand what I have discussed and is in agreement with it.  Encounter Diagnoses  Name Primary?  . Right foot pain Yes  . Strain of right ankle, subsequent encounter     PLAN Call if any problems.  Precautions discussed.  Continue current medications.   Return to clinic prn   Electronically Signed Darreld McleanWayne Catherin Doorn, MD 2/13/201910:04 AM

## 2017-03-14 NOTE — Patient Instructions (Signed)
Out of work until February 25.  Return to work February 25.

## 2017-03-14 NOTE — Telephone Encounter (Signed)
Discussed at patient's visit today, and completed as per [employer] form process.

## 2017-03-21 ENCOUNTER — Encounter: Payer: Self-pay | Admitting: Orthopaedic Surgery

## 2017-03-21 ENCOUNTER — Ambulatory Visit (INDEPENDENT_AMBULATORY_CARE_PROVIDER_SITE_OTHER): Payer: BLUE CROSS/BLUE SHIELD

## 2017-03-21 ENCOUNTER — Ambulatory Visit: Payer: BLUE CROSS/BLUE SHIELD | Admitting: Orthopaedic Surgery

## 2017-03-21 VITALS — BP 102/67 | HR 73 | Temp 98.3°F | Ht 64.0 in | Wt 99.0 lb

## 2017-03-21 DIAGNOSIS — M25571 Pain in right ankle and joints of right foot: Secondary | ICD-10-CM

## 2017-03-21 DIAGNOSIS — S96911D Strain of unspecified muscle and tendon at ankle and foot level, right foot, subsequent encounter: Secondary | ICD-10-CM | POA: Diagnosis not present

## 2017-03-21 NOTE — Patient Instructions (Signed)
Out of work 

## 2017-03-21 NOTE — Progress Notes (Signed)
Patient Grace Cross:HQION:Aza Bartholome BillM Rauth, female DOB:28-Oct-1955, 62 y.o. GEX:[id number]  Chief Complaint  Patient presents with  . Follow-up    Recheck on right foot.    HPI  Grace Cross is a 62 y.o. female who has continued pain and swelling of the right foot.  She has been using a small CAM walker. She has used ice and elevation. She was to go back to work as she had improved but the swelling has returned.  She is having pain with walking. She has no new trauma, no redness, no numbness.  I will repeat x-rays to make sure there is no occult fracture present/stress fracture. HPI  Body mass index is 16.99 kg/m.  ROS  Review of Systems  Respiratory: Positive for shortness of breath.   Musculoskeletal: Positive for arthralgias, gait problem and joint swelling.  All other systems reviewed and are negative.   Past Medical History:  Diagnosis Date  . Acid reflux   . Asthma   . Cardiac arrhythmia   . Hypertension     Past Surgical History:  Procedure Laterality Date  . COLONOSCOPY, ESOPHAGOGASTRODUODENOSCOPY (EGD) AND ESOPHAGEAL DILATION N/A 06/17/2012   WNU:UVOZSLF:TICS IN CECUM/Mod IH/ESO ring/Gastritis  . none      Family History  Problem Relation Age of Onset  . Colon cancer Neg Hx   . Inflammatory bowel disease Neg Hx     Social History Social History   Tobacco Use  . Smoking status: Former Smoker    Types: Cigarettes  . Smokeless tobacco: Never Used  . Tobacco comment: Quit x 15-20 years  Substance Use Topics  . Alcohol use: Yes    Comment: occ  . Drug use: No    Allergies  Allergen Reactions  . Penicillins Rash    Current Outpatient Medications  Medication Sig Dispense Refill  . ADVAIR DISKUS 250-50 MCG/DOSE AEPB     . albuterol (PROAIR HFA) 108 (90 BASE) MCG/ACT inhaler Inhale 2 puffs into the lungs every 6 (six) hours as needed. For shortness of breath    . BYSTOLIC 20 MG TABS     . calcium-vitamin D (OSCAL WITH D) 500-200 MG-UNIT per tablet Take 1 tablet by mouth 3  (three) times daily. (Patient taking differently: Take 1 tablet by mouth daily. ) 90 tablet 0  . cevimeline (EVOXAC) 30 MG capsule Take 30 mg by mouth 3 (three) times daily. For mouth dryness    . dicyclomine (BENTYL) 10 MG capsule 1 PO 30 MIN PRIOR TO BREAKFAST AND LUNCH AND AT BEDTIME. (Patient not taking: Reported on 10/11/2016) 90 capsule 11  . Eluxadoline (VIBERZI) 75 MG TABS Take 75 mg by mouth 2 (two) times daily. 60 tablet 5  . escitalopram (LEXAPRO) 10 MG tablet Take 10 mg by mouth every morning.     . hydroxychloroquine (PLAQUENIL) 200 MG tablet Take 200 mg by mouth daily as needed.     Marland Kitchen. ibuprofen (ADVIL,MOTRIN) 800 MG tablet     . meloxicam (MOBIC) 7.5 MG tablet     . methocarbamol (ROBAXIN) 500 MG tablet Take 1 tablet (500 mg total) by mouth 3 (three) times daily. (Patient not taking: Reported on 03/14/2017) 21 tablet 0  . omeprazole (PRILOSEC) 20 MG capsule TAKE ONE CAPSULE BY MOUTH TWICE DAILY BEFORE A MEAL 60 capsule 5  . traMADol (ULTRAM) 50 MG tablet      No current facility-administered medications for this visit.      Physical Exam  Blood pressure 102/67, pulse 73, temperature 98.3 F (36.8  C), height 5\' 4"  (1.626 m), weight 99 lb (44.9 kg).  Constitutional: overall normal hygiene, normal nutrition, well developed, normal grooming, normal body habitus. Assistive device:cam walker  Musculoskeletal: gait and station Limp right, muscle tone and strength are normal, no tremors or atrophy is present.  .  Neurological: coordination overall normal.  Deep tendon reflex/nerve stretch intact.  Sensation normal.  Cranial nerves II-XII intact.   Skin:   Normal overall no scars, lesions, ulcers or rashes. No psoriasis.  Psychiatric: Alert and oriented x 3.  Recent memory intact, remote memory unclear.  Normal mood and affect. Well groomed.  Good eye contact.  Cardiovascular: overall no swelling, no varicosities, no edema bilaterally, normal temperatures of the legs and arms, no  clubbing, cyanosis and good capillary refill.  Lymphatic: palpation is normal.  The right ankle has full motion and no swelling. The right foot has swelling of the forefoot and mid foot.  NV intact. She has no one area of pain.  She has diffuse pain and swelling.  All other systems reviewed and are negative   The patient has been educated about the nature of the problem(s) and counseled on treatment options.  The patient appeared to understand what I have discussed and is in agreement with it.  Encounter Diagnoses  Name Primary?  . Strain of right ankle, subsequent encounter Yes  . Pain in joint involving right ankle and foot    X-rays were done of the right foot and ankle, reported separately.  No fracture seen.  PLAN Call if any problems.  Precautions discussed.  Continue current medications.   Return to clinic 3 weeks   Begin PT.  Stay out of work.  Continue the CAM walker. Consider MRI if not improved.  Electronically Signed Darreld Mclean, MD 2/20/201910:16 AM

## 2017-04-11 ENCOUNTER — Ambulatory Visit: Payer: BLUE CROSS/BLUE SHIELD | Admitting: Orthopedic Surgery

## 2017-04-11 ENCOUNTER — Encounter: Payer: Self-pay | Admitting: Orthopedic Surgery

## 2017-04-11 VITALS — BP 134/94 | HR 72 | Ht 64.0 in | Wt 99.0 lb

## 2017-04-11 DIAGNOSIS — M2142 Flat foot [pes planus] (acquired), left foot: Secondary | ICD-10-CM

## 2017-04-11 DIAGNOSIS — M2141 Flat foot [pes planus] (acquired), right foot: Secondary | ICD-10-CM | POA: Diagnosis not present

## 2017-04-11 DIAGNOSIS — M25571 Pain in right ankle and joints of right foot: Secondary | ICD-10-CM

## 2017-04-11 DIAGNOSIS — R600 Localized edema: Secondary | ICD-10-CM | POA: Diagnosis not present

## 2017-04-11 DIAGNOSIS — S96911D Strain of unspecified muscle and tendon at ankle and foot level, right foot, subsequent encounter: Secondary | ICD-10-CM

## 2017-04-11 NOTE — Progress Notes (Signed)
Progress Note   Patient ID: Grace Cross, female   DOB: 15-Jan-1956, 62 y.o.   MRN: [id number]  Chief Complaint  Patient presents with  . Foot Pain    right     62 year old female presents back for reevaluation after physical therapy for right foot and ankle injury  Her last note indicates the following: Grace Cross is a 62 y.o. female who has continued pain and swelling of the right foot.  She has been using a small CAM walker. She has used ice and elevation. She was to go back to work as she had improved but the swelling has returned.  She is having pain with walking. She has no new trauma, no redness, no numbness.   I will repeat x-rays to make sure there is no occult fracture present/stress fracture.  She now complains of: Swelling at the end of the day pain along the anterolateral ankle and plantar aspect medial portion of the foot     Review of Systems  Constitutional: Negative for fever.  Skin: Negative.   Neurological: Negative for tingling and sensory change.   No outpatient medications have been marked as taking for the 04/11/17 encounter (Appointment) with Vickki HearingHarrison, Stanley E, MD.    Allergies  Allergen Reactions  . Penicillins Rash     BP (!) 134/94   Pulse 72   Ht 5\' 4"  (1.626 m)   Wt 99 lb (44.9 kg)   BMI 16.99 kg/m   Physical Exam  Constitutional: She is oriented to person, place, and time. She appears well-developed and well-nourished.  Musculoskeletal:       Feet:  Neurological: She is alert and oriented to person, place, and time.  Psychiatric: She has a normal mood and affect. Judgment normal.  Vitals reviewed.    Medical decision-making  A an ankle x-ray and a foot x-ray was done on the last visit I have reviewed both of those they were normal   Encounter Diagnoses  Name Primary?  . Strain of right ankle, subsequent encounter Yes  . Pain in joint involving right ankle and foot   . Pes planus of both feet   . Edema, leg    Follow-up  on April 10 out of work through April 9 continue physical therapy at compression hose to control swelling and foot orthotic for mild pes planus  Fuller CanadaStanley Harrison, MD 04/11/2017 9:29 AM

## 2017-04-11 NOTE — Patient Instructions (Signed)
Wear TED hose daily to control swelling  Continue physical therapy through April 9  Out of work note through April 10  Follow-up April 10

## 2017-04-18 ENCOUNTER — Telehealth: Payer: Self-pay

## 2017-04-18 ENCOUNTER — Encounter: Payer: Self-pay | Admitting: Gastroenterology

## 2017-04-18 ENCOUNTER — Ambulatory Visit: Payer: BLUE CROSS/BLUE SHIELD | Admitting: Gastroenterology

## 2017-04-18 ENCOUNTER — Other Ambulatory Visit: Payer: Self-pay

## 2017-04-18 DIAGNOSIS — K580 Irritable bowel syndrome with diarrhea: Secondary | ICD-10-CM

## 2017-04-18 DIAGNOSIS — R634 Abnormal weight loss: Secondary | ICD-10-CM

## 2017-04-18 DIAGNOSIS — R1319 Other dysphagia: Secondary | ICD-10-CM

## 2017-04-18 DIAGNOSIS — R1310 Dysphagia, unspecified: Secondary | ICD-10-CM

## 2017-04-18 MED ORDER — ELUXADOLINE 75 MG PO TABS: 75.0000 mg | ORAL_TABLET | Freq: Two times a day (BID) | ORAL | 5 refills | Status: DC

## 2017-04-18 NOTE — Assessment & Plan Note (Signed)
SYMPTOMS CONTROLLED/RESOLVED.  CONTINUE TO MONITOR SYMPTOMS. 

## 2017-04-18 NOTE — Progress Notes (Signed)
ON RECALL  °

## 2017-04-18 NOTE — Assessment & Plan Note (Signed)
WEIGHT UP SINCE SEP 2018.  CONTINUE TO MONITOR SYMPTOMS.

## 2017-04-18 NOTE — Progress Notes (Signed)
CC'ED TO PCP 

## 2017-04-18 NOTE — Assessment & Plan Note (Addendum)
SYMPTOMS NOT IDEALLY CONTROLLED. DIFFERENTIAL DIAGNOSIS INCLUDES: PEPTIC STRICTURE, PRIMARY ESOPHAGEAL MOTILITY DISORDER,OR 2o ESOPHAGEAL MOTILITY DISORDER DUE TO UNCONTROLLED REFLUX, LESS LIKLEY ESOPHAGEAL CANCER.  FOLLOW A SOFT MECHANICAL/LOWFOD MAP  DIET. SEE INFO BELOW.  MEATS SHOULD BE GROUND ONLY. DO NOT EAT CHUNKS OF ANYTHING. COMPLETE UPPER ENDOSCOPY TO STRETCH YOUR ESOPHAGUS IN 2-3 WEEKS. WE WILL SCHEDULE YOU WITH PROPOFOL. PT FAILED CONSCIOUS SEDATION IN 2014. DISCUSSED PROCEDURE, BENEFITS, & RISKS: < 1% chance of medication reaction, bleeding, OR perforation. FOLLOW UP IN 4 MOS.

## 2017-04-18 NOTE — Assessment & Plan Note (Signed)
SYMPTOMS FAIRLY WELL CONTROLLED BUT NOT IDEALLY CONTROLLED ON BENTYL. VIBERZI RESOLVES SYMPTOMS.  FOLLOW A SOFT MECHANICAL/LOWFOD MAP  DIET.  HANDOUT GIVEN.  MEATS SHOULD BE GROUND ONLY. DO NOT EAT CHUNKS OF ANYTHING.  STOP BENTYL. TAKE VIBERZI ONE TABLET ONCE OR  TWICE DAILY. USE ONLY IMODIUM 1 OR 2 TIMES A DAY IF NEEDED TO CONTROL DIARRHEA. YOU MUST CALL IF YOU GO MORE THAN 4 DAYS WITHOUT A BOWEL MOVEMENT OR YOU DEVELOP INCREASE IN NAUSEA, VOMITING, OR ABDOMINAL PAIN. FOLLOW UP IN 4 MOS.

## 2017-04-18 NOTE — Patient Instructions (Signed)
FOLLOW A SOFT MECHANICAL/LOWFOD MAP  DIET. SEE INFO BELOW.  MEATS SHOULD BE GROUND ONLY. DO NOT EAT CHUNKS OF ANYTHING.  STOP BENTYL. TAKE VIBERZI ONE TABLET ONCE OR  TWICE DAILY. USE ONLY IMODIUM 1 OR 2 TIMES A DAY IF NEEDED TO CONTROL DIARRHEA.  YOU MUST CALL IF YOU GO MORE THAN 4 DAYS WITHOUT A BOWEL MOVEMENT OR YOU DEVELOP INCREASE IN NAUSEA, VOMITING, OR ABDOMINAL PAIN.   COMPLETE UPPER ENDOSCOPY TO STRETCH YOUR ESOPHAGUS IN 2-3 WEEKS. WE WILL SCHEDULE YOU WITH PROPOFOL TO MAKE SURE YOU ARE ADEQUATELY SEDATED.   FOLLOW UP IN 4 MOS.   SOFT MECHANICAL DIET This SOFT MECHANICAL DIET is restricted to:  Foods that are moist, soft-textured, and easy to chew and swallow.   Meats that are ground or are minced no larger than one-quarter inch pieces. Meats are moist with gravy or sauce added.   Foods that do not include bread or bread-like textures except soft pancakes, well-moistened with syrup or sauce.   Textures with some chewing ability required.   Casseroles without rice.   Cooked vegetables that are less than half an inch in size and easily mashed with a fork. No cooked corn, peas, broccoli, cauliflower, cabbage, Brussels sprouts, asparagus, or other fibrous, non-tender or rubbery cooked vegetables.   Canned fruit except for pineapple. Fruit must be cut into pieces no larger than half an inch in size.   Foods that do not include nuts, seeds, coconut, or sticky textures.

## 2017-04-18 NOTE — Telephone Encounter (Signed)
Called and informed pt's sister Chales Abrahams(Mary Ann) per her request of pre-op appt 05/23/17 at 11:00am. Letter mailed.

## 2017-04-18 NOTE — Progress Notes (Signed)
Subjective:    Patient ID: Grace Cross, female    DOB: 02/19/1955, 62 y.o.   MRN: [id number]  Grace Stabile, MD  HPI Trouble swallowing: 2X A WEEK OR MORE and bowels better with Viberzi but too expensive. Viberzi worked better [employer]. Then re-started bentyl. BMs: NO TROUBLE THIS WEEK, BUT WITH FLARES HAS WATERY/LOOSE STOOL 3-4 TIMES A DAY. TRIGGERS: HEAVY GREASY/FRIED FOODS. < 3-4 Etoh drinks a day. HAVING BURNING IN HER CHEST AFTER SHE EATS AND FEELS LIKE FOOD IS STUCK. SOLIDS ONLY GIVE HER TROUBLE. Problems with sedation: DIDN'T GET ENOUGH, RECALLED SOME OF THE BOTTOM AND TOP. MAY HAVE SOB DUE TO ASTHMA.  PT DENIES FEVER, CHILLS, HEMATOCHEZIA, HEMATEMESIS, nausea, vomiting, melena, diarrhea, CHEST PAIN, CHANGE IN BOWEL IN HABITS, constipation, OR abdominal pain.   Past Medical History:  Diagnosis Date  . Acid reflux   . Asthma   . Cardiac arrhythmia   . Hypertension     Past Surgical History:  Procedure Laterality Date  . COLONOSCOPY, ESOPHAGOGASTRODUODENOSCOPY (EGD) AND ESOPHAGEAL DILATION N/A 06/17/2012   WUJ:WJXB IN CECUM/Mod IH/ESO ring/Gastritis  . none     Allergies  Allergen Reactions  . Penicillins Rash    Current Outpatient Medications  Medication Sig    . ADVAIR DISKUS 250-50 MCG/DOSE AEPB Inhale 1 puff into the lungs 2 (two) times daily.     Marland Kitchen albuterol (PROAIR HFA) 108 (90 BASE) MCG/ACT inhaler Inhale 2 puffs into the lungs every 6 (six) hours as needed. For shortness of breath    . BYSTOLIC 20 MG TABS Take 0.5 tablets by mouth daily.     Marland Kitchen escitalopram (LEXAPRO) 10 MG tablet Take 10 mg by mouth every morning.     . hydroxychloroquine (PLAQUENIL) 200 MG tablet Take 200 mg by mouth daily as needed.     Marland Kitchen ibuprofen (ADVIL,MOTRIN) 800 MG tablet Take 800 mg by mouth as needed.     . methocarbamol (ROBAXIN) 500 MG tablet Take 1 tablet (500 mg total) by mouth 3 (three) times daily. (Patient taking differently: Take 500 mg by mouth as needed. )    . omeprazole  (PRILOSEC) 20 MG capsule TAKE ONE CAPSULE BY MOUTH TWICE DAILY BEFORE A MEAL    . calcium-vitamin D (OSCAL WITH D) 500-200 MG-UNIT per tablet Take 1 tablet by mouth 3 (three) times daily. (Patient not taking: Reported on 04/18/2017)    . cevimeline (EVOXAC) 30 MG capsule Take 30 mg by mouth 3 (three) times daily. For mouth dryness    .      .      . meloxicam (MOBIC) 7.5 MG tablet     . traMADol (ULTRAM) 50 MG tablet Take 50 mg by mouth 3 times/day as needed-between meals & bedtime.      Review of Systems PER HPI OTHERWISE ALL SYSTEMS ARE NEGATIVE.    Objective:   Physical Exam  Constitutional: She is oriented to person, place, and time. She appears well-developed and well-nourished. No distress.  HENT:  Head: Normocephalic and atraumatic.  Mouth/Throat: Oropharynx is clear and moist. No oropharyngeal exudate.  edentulous  Eyes: Pupils are equal, round, and reactive to light. No scleral icterus.  Neck: Normal range of motion. Neck supple.  Cardiovascular: Normal rate, regular rhythm and normal heart sounds.  Pulmonary/Chest: Effort normal and breath sounds normal. No respiratory distress.  Abdominal: Soft. Bowel sounds are normal. She exhibits no distension. There is tenderness. There is no rebound and no guarding.  MILD TTP x4  Musculoskeletal: She exhibits no edema.  Lymphadenopathy:    She has no cervical adenopathy.  Neurological: She is alert and oriented to person, place, and time.  NO  NEW FOCAL DEFICITS  Psychiatric: She has a normal mood and affect.  Vitals reviewed.     Assessment & Plan:

## 2017-05-09 ENCOUNTER — Ambulatory Visit: Payer: BLUE CROSS/BLUE SHIELD | Admitting: Orthopedic Surgery

## 2017-05-09 ENCOUNTER — Encounter: Payer: Self-pay | Admitting: Orthopedic Surgery

## 2017-05-09 VITALS — BP 113/73 | HR 81 | Ht 61.0 in | Wt 97.0 lb

## 2017-05-09 DIAGNOSIS — S96911D Strain of unspecified muscle and tendon at ankle and foot level, right foot, subsequent encounter: Secondary | ICD-10-CM | POA: Diagnosis not present

## 2017-05-09 NOTE — Progress Notes (Signed)
Progress Note   Patient ID: Grace Cross, female   DOB: 11-21-1955, 62 y.o.   MRN: [id number]  Chief Complaint  Patient presents with  . Foot Pain    right   . Ankle Pain    right     62 years old comes in for reevaluation regarding her right ankle which has improved.  She was unable to get the foot orthotics will try again today and we also gave her an alternative [place] [facility] [place].  Overall her pain has improved her swelling is down with the stocking    ROS Current Meds  Medication Sig  . ADVAIR DISKUS 250-50 MCG/DOSE AEPB Inhale 1 puff into the lungs 2 (two) times daily.   Marland Kitchen. albuterol (PROAIR HFA) 108 (90 BASE) MCG/ACT inhaler Inhale 2 puffs into the lungs every 6 (six) hours as needed. For shortness of breath  . BYSTOLIC 20 MG TABS Take 0.5 tablets by mouth daily.   . calcium-vitamin D (OSCAL WITH D) 500-200 MG-UNIT per tablet Take 1 tablet by mouth 3 (three) times daily.  . cevimeline (EVOXAC) 30 MG capsule Take 30 mg by mouth 3 (three) times daily. For mouth dryness  . dicyclomine (BENTYL) 10 MG capsule 1 PO 30 MIN PRIOR TO BREAKFAST AND LUNCH AND AT BEDTIME.  Marland Kitchen. Eluxadoline (VIBERZI) 75 MG TABS Take 75 mg by mouth 2 (two) times daily.  Marland Kitchen. escitalopram (LEXAPRO) 10 MG tablet Take 10 mg by mouth every morning.   . hydroxychloroquine (PLAQUENIL) 200 MG tablet Take 200 mg by mouth daily as needed.   Marland Kitchen. ibuprofen (ADVIL,MOTRIN) 800 MG tablet Take 800 mg by mouth as needed.     Allergies  Allergen Reactions  . Penicillins Rash     BP 113/73   Pulse 81   Ht 5\' 1"  (1.549 m)   Wt 97 lb (44 kg)   BMI 18.33 kg/m   Physical Exam  Musculoskeletal:       Feet:     Medical decision-making Encounter Diagnosis  Name Primary?  . Strain of right ankle, subsequent encounter Yes   Overall improvement  May return to work on the 16th.  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  Fuller CanadaStanley Harrison, MD 05/09/2017 9:51 AM

## 2017-05-09 NOTE — Patient Instructions (Signed)
RTW April 16

## 2017-05-21 NOTE — Patient Instructions (Signed)
Grace Cross  05/21/2017     @PREFPERIOPPHARMACY @   Your procedure is scheduled on 05/29/2017.  Report to [facility] at 6:15 A.M.  Call this number if you have problems the morning of surgery:  [phone]   Remember:  Do not eat food or drink liquids after midnight.  Take these medicines the morning of surgery with A SIP OF WATER Advair, Albuterol, Bystolic, Evoxac, Viberzi, Lexapro, Plaquenil, Mobic, Robaxin, Prilosec   Do not wear jewelry, make-up or nail polish.  Do not wear lotions, powders, or perfumes, or deodorant.  Do not shave 48 hours prior to surgery.  Men may shave face and neck.  Do not bring valuables to the hospital.  [facility] is not responsible for any belongings or valuables.  Contacts, dentures or bridgework may not be worn into surgery.  Leave your suitcase in the car.  After surgery it may be brought to your room.  For patients admitted to the hospital, discharge time will be determined by your treatment team.  Patients discharged the day of surgery will not be allowed to drive home.    Please read over the following fact sheets that you were given. Anesthesia Post-op Instructions  PATIENT INSTRUCTIONS POST-ANESTHESIA  IMMEDIATELY FOLLOWING SURGERY:  Do not drive or operate machinery for the first twenty four hours after surgery.  Do not make any important decisions for twenty four hours after surgery or while taking narcotic pain medications or sedatives.  If you develop intractable nausea and vomiting or a severe headache please notify your doctor immediately.  FOLLOW-UP:  Please make an appointment with your surgeon as instructed. You do not need to follow up with anesthesia unless specifically instructed to do so.  WOUND CARE INSTRUCTIONS (if applicable):  Keep a dry clean dressing on the anesthesia/puncture wound site if there is drainage.  Once the wound has quit draining you may leave it open to air.  Generally you should leave the bandage  intact for twenty four hours unless there is drainage.  If the epidural site drains for more than 36-48 hours please call the anesthesia department.  QUESTIONS?:  Please feel free to call your physician or the hospital operator if you have any questions, and they will be happy to assist you.            Esophagogastroduodenoscopy Esophagogastroduodenoscopy (EGD) is a procedure to examine the lining of the esophagus, stomach, and first part of the small intestine (duodenum). This procedure is done to check for problems such as inflammation, bleeding, ulcers, or growths. During this procedure, a long, flexible, lighted tube with a camera attached (endoscope) is inserted down the throat. Tell a health care provider about:  Any allergies you have.  All medicines you are taking, including vitamins, herbs, eye drops, creams, and over-the-counter medicines.  Any problems you or family members have had with anesthetic medicines.  Any blood disorders you have.  Any surgeries you have had.  Any medical conditions you have.  Whether you are pregnant or may be pregnant. What are the risks? Generally, this is a safe procedure. However, problems may occur, including:  Infection.  Bleeding.  A tear (perforation) in the esophagus, stomach, or duodenum.  Trouble breathing.  Excessive sweating.  Spasms of the larynx.  A slowed heartbeat.  Low blood pressure.  What happens before the procedure?  Follow instructions from your health care provider about eating or drinking restrictions.  Ask your health care provider about: ? Changing or stopping  your regular medicines. This is especially important if you are taking diabetes medicines or blood thinners. ? Taking medicines such as aspirin and ibuprofen. These medicines can thin your blood. Do not take these medicines before your procedure if your health care provider instructs you not to.  Plan to have someone take you home after the  procedure.  If you wear dentures, be ready to remove them before the procedure. What happens during the procedure?  To reduce your risk of infection, your health care team will wash or sanitize their hands.  An IV tube will be put in a vein in your hand or arm. You will get medicines and fluids through this tube.  You will be given one or more of the following: ? A medicine to help you relax (sedative). ? A medicine to numb the area (local anesthetic). This medicine may be sprayed into your throat. It will make you feel more comfortable and keep you from gagging or coughing during the procedure. ? A medicine for pain.  A mouth guard may be placed in your mouth to protect your teeth and to keep you from biting on the endoscope.  You will be asked to lie on your left side.  The endoscope will be lowered down your throat into your esophagus, stomach, and duodenum.  Air will be put into the endoscope. This will help your health care provider see better.  The lining of your esophagus, stomach, and duodenum will be examined.  Your health care provider may: ? Take a tissue sample so it can be looked at in a lab (biopsy). ? Remove growths. ? Remove objects (foreign bodies) that are stuck. ? Treat any bleeding with medicines or other devices that stop tissue from bleeding. ? Widen (dilate) or stretch narrowed areas of your esophagus and stomach.  The endoscope will be taken out. The procedure may vary among health care providers and hospitals. What happens after the procedure?  Your blood pressure, heart rate, breathing rate, and blood oxygen level will be monitored often until the medicines you were given have worn off.  Do not eat or drink anything until the numbing medicine has worn off and your gag reflex has returned. This information is not intended to replace advice given to you by your health care provider. Make sure you discuss any questions you have with your health care  provider. Document Released: 05/19/2004 Document Revised: 06/24/2015 Document Reviewed: 12/10/2014 Elsevier Interactive Patient Education  2018 [employer] Esophageal Dilatation Esophageal dilatation is a procedure to open a blocked or narrowed part of the esophagus. The esophagus is the long tube in your throat that carries food and liquid from your mouth to your stomach. The procedure is also called esophageal dilation. You may need this procedure if you have a buildup of scar tissue in your esophagus that makes it difficult, painful, or even impossible to swallow. This can be caused by gastroesophageal reflux disease (GERD). In rare cases, people need this procedure because they have cancer of the esophagus or a problem with the way food moves through the esophagus. Sometimes you may need to have another dilatation to enlarge the opening of the esophagus gradually. Tell a health care provider about:  Any allergies you have.  All medicines you are taking, including vitamins, herbs, eye drops, creams, and over-the-counter medicines.  Any problems you or family members have had with anesthetic medicines.  Any blood disorders you have.  Any surgeries you have had.  Any medical conditions  you have.  Any antibiotic medicines you are required to take before dental procedures. What are the risks? Generally, this is a safe procedure. However, problems can occur and include:  Bleeding from a tear in the lining of the esophagus.  A hole (perforation) in the esophagus.  What happens before the procedure?  Do not eat or drink anything after midnight on the night before the procedure or as directed by your health care provider.  Ask your health care provider about changing or stopping your regular medicines. This is especially important if you are taking diabetes medicines or blood thinners.  Plan to have someone take you home after the procedure. What happens during the procedure?  You  will be given a medicine that makes you relaxed and sleepy (sedative).  A medicine may be sprayed or gargled to numb the back of the throat.  Your health care provider can use various instruments to do an esophageal dilatation. During the procedure, the instrument used will be placed in your mouth and passed down into your esophagus. Options include: ? Simple dilators. This instrument is carefully placed in the esophagus to stretch it. ? Guided wire bougies. In this method, a flexible tube (endoscope) is used to insert a wire into the esophagus. The dilator is passed over this wire to enlarge the esophagus. Then the wire is removed. ? Balloon dilators. An endoscope with a small balloon at the end is passed down into the esophagus. Inflating the balloon gently stretches the esophagus and opens it up. What happens after the procedure?  Your blood pressure, heart rate, breathing rate, and blood oxygen level will be monitored often until the medicines you were given have worn off.  Your throat may feel slightly sore and will probably still feel numb. This will improve slowly over time.  You will not be allowed to eat or drink until the throat numbness has resolved.  If this is a same-day procedure, you may be allowed to go home once you have been able to drink, urinate, and sit on the edge of the bed without nausea or dizziness.  If this is a same-day procedure, you should have a friend or family member with you for the next 24 hours after the procedure. This information is not intended to replace advice given to you by your health care provider. Make sure you discuss any questions you have with your health care provider. Document Released: 03/09/2005 Document Revised: 06/24/2015 Document Reviewed: 05/28/2013 Elsevier Interactive Patient Education  [employer]2018 [employer] Inc[employer]

## 2017-05-22 ENCOUNTER — Telehealth: Payer: Self-pay

## 2017-05-22 NOTE — Telephone Encounter (Signed)
Pt called office and spoke to SS. She requested to cancel EGD/Dil for 05/29/17. She started a new job and is unable to take off from work. Will need to wait 90 days. LMOVM and informed Endo scheduler.

## 2017-05-23 ENCOUNTER — Encounter (HOSPITAL_COMMUNITY)
Admission: RE | Admit: 2017-05-23 | Discharge: 2017-05-23 | Disposition: A | Discharge status: Home / Self Care | Payer: BLUE CROSS/BLUE SHIELD | Source: Ambulatory Visit | Attending: Gastroenterology | Admitting: Gastroenterology

## 2017-05-23 ENCOUNTER — Encounter (HOSPITAL_COMMUNITY): Payer: Self-pay

## 2017-05-24 NOTE — Telephone Encounter (Signed)
LMOM to call.

## 2017-05-24 NOTE — Telephone Encounter (Signed)
PLEASE CALL PT. SHE SHOULD STRICTLY FOLLOW A SOFT MECHANICAL DIET UNTIL HER EGD IS COMPLETRD.

## 2017-05-28 NOTE — Telephone Encounter (Signed)
PT is aware.

## 2017-05-29 ENCOUNTER — Encounter (HOSPITAL_COMMUNITY): Admission: RE | Payer: Self-pay | Source: Ambulatory Visit

## 2017-05-29 ENCOUNTER — Ambulatory Visit (HOSPITAL_COMMUNITY)
Admission: RE | Admit: 2017-05-29 | Payer: BLUE CROSS/BLUE SHIELD | Source: Ambulatory Visit | Admitting: Gastroenterology

## 2017-05-29 SURGERY — ESOPHAGOGASTRODUODENOSCOPY (EGD) WITH PROPOFOL
Anesthesia: Monitor Anesthesia Care

## 2017-07-04 ENCOUNTER — Encounter: Payer: Self-pay | Admitting: Gastroenterology

## 2017-10-18 ENCOUNTER — Observation Stay (HOSPITAL_COMMUNITY)
Admission: EM | Admit: 2017-10-18 | Discharge: 2017-10-19 | Disposition: A | Discharge status: Home / Self Care | Payer: BLUE CROSS/BLUE SHIELD | Attending: Internal Medicine | Admitting: Internal Medicine

## 2017-10-18 ENCOUNTER — Emergency Department (HOSPITAL_COMMUNITY): Payer: BLUE CROSS/BLUE SHIELD

## 2017-10-18 ENCOUNTER — Encounter (HOSPITAL_COMMUNITY): Payer: Self-pay | Admitting: Emergency Medicine

## 2017-10-18 ENCOUNTER — Other Ambulatory Visit: Payer: Self-pay

## 2017-10-18 DIAGNOSIS — I10 Essential (primary) hypertension: Secondary | ICD-10-CM | POA: Diagnosis not present

## 2017-10-18 DIAGNOSIS — R0902 Hypoxemia: Secondary | ICD-10-CM | POA: Insufficient documentation

## 2017-10-18 DIAGNOSIS — R079 Chest pain, unspecified: Secondary | ICD-10-CM | POA: Diagnosis present

## 2017-10-18 DIAGNOSIS — K589 Irritable bowel syndrome without diarrhea: Principal | ICD-10-CM | POA: Insufficient documentation

## 2017-10-18 DIAGNOSIS — R0602 Shortness of breath: Secondary | ICD-10-CM | POA: Diagnosis not present

## 2017-10-18 DIAGNOSIS — J45909 Unspecified asthma, uncomplicated: Secondary | ICD-10-CM | POA: Diagnosis not present

## 2017-10-18 DIAGNOSIS — Z87891 Personal history of nicotine dependence: Secondary | ICD-10-CM | POA: Diagnosis not present

## 2017-10-18 LAB — BASIC METABOLIC PANEL
Anion gap: 6 (ref 5–15)
BUN: 20 mg/dL (ref 8–23)
CHLORIDE: 103 mmol/L (ref 98–111)
CO2: 29 mmol/L (ref 22–32)
Calcium: 9.5 mg/dL (ref 8.9–10.3)
Creatinine, Ser: 1.07 mg/dL — ABNORMAL HIGH (ref 0.44–1.00)
GFR calc non Af Amer: 55 mL/min — ABNORMAL LOW (ref >60)
Glucose, Bld: 99 mg/dL (ref 70–99)
POTASSIUM: 4.5 mmol/L (ref 3.5–5.1)
Sodium: 138 mmol/L (ref 135–145)

## 2017-10-18 LAB — CBC
HEMATOCRIT: 37.1 % (ref 36.0–46.0)
Hemoglobin: 12.2 g/dL (ref 12.0–15.0)
MCH: 31.4 pg (ref 26.0–34.0)
MCHC: 32.9 g/dL (ref 30.0–36.0)
MCV: 95.4 fL (ref 78.0–100.0)
PLATELETS: 278 10*3/uL (ref 150–400)
RBC: 3.89 MIL/uL (ref 3.87–5.11)
RDW: 13.3 % (ref 11.5–15.5)
WBC: 5.5 10*3/uL (ref 4.0–10.5)

## 2017-10-18 LAB — TROPONIN I
TROPONIN I: 0.05 ng/mL — AB (ref <0.03)
Troponin I: 0.05 ng/mL (ref <0.03)

## 2017-10-18 LAB — BRAIN NATRIURETIC PEPTIDE: B Natriuretic Peptide: 49 pg/mL (ref 0.0–100.0)

## 2017-10-18 MED ORDER — KETOROLAC TROMETHAMINE 30 MG/ML IJ SOLN
15.0000 mg | Freq: Once | INTRAMUSCULAR | Status: AC
  Administered 2017-10-18: 15 mg via INTRAVENOUS
  Filled 2017-10-18: qty 1

## 2017-10-18 MED ORDER — PREDNISONE 50 MG PO TABS
60.0000 mg | ORAL_TABLET | Freq: Once | ORAL | Status: AC
  Administered 2017-10-18: 60 mg via ORAL
  Filled 2017-10-18: qty 1

## 2017-10-18 MED ORDER — IOPAMIDOL (ISOVUE-370) INJECTION 76%
60.0000 mL | Freq: Once | INTRAVENOUS | Status: AC | PRN
  Administered 2017-10-18: 60 mL via INTRAVENOUS

## 2017-10-18 MED ORDER — IPRATROPIUM-ALBUTEROL 0.5-2.5 (3) MG/3ML IN SOLN
3.0000 mL | RESPIRATORY_TRACT | Status: DC
  Administered 2017-10-18 (×2): 3 mL via RESPIRATORY_TRACT
  Filled 2017-10-18 (×2): qty 3

## 2017-10-18 MED ORDER — ASPIRIN 81 MG PO CHEW
324.0000 mg | CHEWABLE_TABLET | Freq: Once | ORAL | Status: AC
  Administered 2017-10-18: 324 mg via ORAL
  Filled 2017-10-18: qty 4

## 2017-10-18 NOTE — ED Triage Notes (Signed)
Pt complaining of chest pain intermittent this week, also with cough/congestion.

## 2017-10-18 NOTE — ED Provider Notes (Signed)
Emergency Department Provider Note   I have reviewed the triage vital signs and the nursing notes.   HISTORY  Chief Complaint Chest Pain   HPI Grace Cross is a 62 y.o. female who presents the emergency department today with URI type symptoms.  She had chest pain worse with coughing and taking deep breaths but also with nonproductive cough congestion.  She is seen by her primary doctor yesterday found to have a "abnormal elevation of my heart muscle test" but unknown what that number was.  Here she is afebrile.  No abdominal symptoms.  She does have a little shortness of breath. No other associated or modifying symptoms.    Past Medical History:  Diagnosis Date  . Acid reflux   . Asthma   . Cardiac arrhythmia   . Hypertension     Patient Active Problem List   Diagnosis Date Noted  . Chest pain 10/19/2017  . HTN (hypertension) 10/19/2017  . Asthma 10/19/2017  . Odynophagia 08/15/2012  . Esophageal dysphagia 06/12/2012  . IBS (irritable bowel syndrome) 06/12/2012  . Loss of weight 06/12/2012    Past Surgical History:  Procedure Laterality Date  . COLONOSCOPY, ESOPHAGOGASTRODUODENOSCOPY (EGD) AND ESOPHAGEAL DILATION N/A 06/17/2012   HQI:ONGE IN CECUM/Mod IH/ESO ring/Gastritis  . none      Current Outpatient Rx  . Order #: [id number] Class: Historical Med  . Order #: [id number] Class: Historical Med  . Order #: [id number] Class: Historical Med  . Order #: [id number] Class: Print  . Order #: [id number] Class: Historical Med  . Order #: [id number] Class: Normal  . Order #: [id number] Class: Historical Med  . Order #: [id number] Class: Normal  . Order #: [id number] Class: Historical Med    Allergies Penicillins  Family History  Problem Relation Age of Onset  . Colon cancer Neg Hx   . Inflammatory bowel disease Neg Hx     Social History Social History   Tobacco Use  . Smoking status: Former Smoker    Types: Cigarettes  . Smokeless tobacco: Never Used  . Tobacco comment:  Quit x 15-20 years  Substance Use Topics  . Alcohol use: Yes    Comment: occ  . Drug use: No    Review of Systems  All other systems negative except as documented in the HPI. All pertinent positives and negatives as reviewed in the HPI. ____________________________________________   PHYSICAL EXAM:  VITAL SIGNS: Blood pressure 113/84, pulse 76, temperature 98.2 F (36.8 C), temperature source Oral, resp. rate 19, height 5\' 2"  (1.575 m), weight 44 kg, SpO2 100 %.  Constitutional: Alert and oriented. Well appearing and in no acute distress. Eyes: Conjunctivae are normal. PERRL. EOMI. Head: Atraumatic. ose: No congestion/rhinnorhea. Mouth/Throat: Mucous membranes are moist.  Oropharynx non-erythematous. Neck: No stridor.  No meningeal signs.   Cardiovascular: Normal rate, regular rhythm. Good peripheral circulation. Grossly normal heart sounds.   Respiratory: Normal respiratory effort.  No retractions. Lungs with wheezing. Gastrointestinal: Soft and nontender. No distention.  Musculoskeletal: No lower extremity tenderness nor edema. No gross deformities of extremities.  Tenderness to palpation over costochondral and left chest. Neurologic:  Normal speech and language. No gross focal neurologic deficits are appreciated.  Skin:  Skin is warm, dry and intact. No rash noted.  ____________________________________________   LABS (all labs ordered are listed, but only abnormal results are displayed)  Labs Reviewed  BASIC METABOLIC PANEL - Abnormal; Notable for the following components:      Result Value   Creatinine, Ser 1.07 (*)    GFR calc  non Af Amer 55 (*)    All other components within normal limits  TROPONIN I - Abnormal; Notable for the following components:   Troponin I 0.05 (*)    All other components within normal limits  TROPONIN I - Abnormal; Notable for the following components:   Troponin I 0.05 (*)    All other components within normal limits  TROPONIN I -  Abnormal; Notable for the following components:   Troponin I 0.04 (*)    All other components within normal limits  TROPONIN I - Abnormal; Notable for the following components:   Troponin I 0.04 (*)    All other components within normal limits  SEDIMENTATION RATE - Abnormal; Notable for the following components:   Sed Rate 62 (*)    All other components within normal limits  GLUCOSE, CAPILLARY - Abnormal; Notable for the following components:   Glucose-Capillary 123 (*)    All other components within normal limits  CBC - Abnormal; Notable for the following components:   WBC 2.4 (*)    RBC 3.54 (*)    Hemoglobin 11.0 (*)    HCT 33.2 (*)    All other components within normal limits  COMPREHENSIVE METABOLIC PANEL - Abnormal; Notable for the following components:   Glucose, Bld 154 (*)    Albumin 3.1 (*)    All other components within normal limits  GLUCOSE, CAPILLARY - Abnormal; Notable for the following components:   Glucose-Capillary 140 (*)    All other components within normal limits  GLUCOSE, CAPILLARY - Abnormal; Notable for the following components:   Glucose-Capillary 127 (*)    All other components within normal limits  CBC  BRAIN NATRIURETIC PEPTIDE  C-REACTIVE PROTEIN  HIV ANTIBODY (ROUTINE TESTING W REFLEX)  ANTINUCLEAR ANTIBODIES, IFA   ____________________________________________  EKG   EKG Interpretation  Date/Time:  Thursday October 18 2017 15:50:43 EDT Ventricular Rate:  77 PR Interval:  178 QRS Duration: 74 QT Interval:  372 QTC Calculation: 420 R Axis:   83 Text Interpretation:  Normal sinus rhythm Normal ECG since last tracing no significant change Confirmed by Mancel Bale [phone]) on 10/18/2017 4:09:40 PM       ____________________________________________  RADIOLOGY  Ct Angio Chest Pe W And/or Wo Contrast  Result Date: 10/18/2017 CLINICAL DATA:  Chest pain and cough EXAM: CT ANGIOGRAPHY CHEST WITH CONTRAST TECHNIQUE: Multidetector CT  imaging of the chest was performed using the standard protocol during bolus administration of intravenous contrast. Multiplanar CT image reconstructions and MIPs were obtained to evaluate the vascular anatomy. CONTRAST:  60mL ISOVUE-370 IOPAMIDOL (ISOVUE-370) INJECTION 76% COMPARISON:  Chest CT February 11, 2009; chest radiograph October 18, 2017 FINDINGS: Cardiovascular: There is no demonstrable pulmonary embolus. There is no thoracic aortic aneurysm or dissection. The visualized great vessels appear unremarkable. There are foci aortic atherosclerosis. No pericardial effusion or pericardial thickening is evident. The main pulmonary outflow tract measures 3.8 cm which is prominent. Mediastinum/Nodes: Thyroid appears unremarkable. There is no appreciable thoracic adenopathy. No esophageal lesions are evident. Lungs/Pleura: There is underlying centrilobular emphysematous change. There is mild scarring and atelectatic change in the bases. There is no edema or consolidation. There is no evident pleural effusion or pleural thickening. There is slight lower lobe bronchiectatic change. Upper Abdomen: Visualized upper abdominal structures appear unremarkable. Musculoskeletal: There are no blastic or lytic bone lesions. No chest wall lesions are evident. Review of the MIP images confirms the above findings. IMPRESSION: 1. No demonstrable pulmonary embolus. No thoracic aortic aneurysm or  dissection. There is thoracic aortic atherosclerosis. 2. There is prominence of the main pulmonary outflow tract indicative of a degree of pulmonary arterial hypertension. 3. Centrilobular emphysematous change. No edema or consolidation. Mild scarring and atelectasis in the lung bases. There is also mild lower lobe bronchiectatic change. 4.  No evident thoracic adenopathy. Aortic Atherosclerosis (ICD10-I70.0) and Emphysema (ICD10-J43.9). Electronically Signed   By: Bretta BangWilliam  Woodruff III M.D.   On: 10/18/2017 20:43     ____________________________________________   PROCEDURES  Procedure(s) performed:   Procedures  CRITICAL CARE Performed by: Marily MemosJason Dinora Hemm Total critical care time: 35 minutes Critical care time was exclusive of separately billable procedures and treating other patients. Critical care was necessary to treat or prevent imminent or life-threatening deterioration. Critical care was time spent personally by me on the following activities: development of treatment plan with patient and/or surrogate as well as nursing, discussions with consultants, evaluation of patient's response to treatment, examination of patient, obtaining history from patient or surrogate, ordering and performing treatments and interventions, ordering and review of laboratory studies, ordering and review of radiographic studies, pulse oximetry and re-evaluation of patient's condition.  ____________________________________________   INITIAL IMPRESSION / ASSESSMENT AND PLAN / ED COURSE  Patient with hypoxic respiratory failure of unknown etiology.  She also had chest tenderness, cough and congestion consistent with likely URI however she had positive troponins.  CT PE study was negative for any obvious pulmonary embolus or pneumonia.  At that point concern could also be for myopericarditis with a viral infection recently chest pain and hypoxia however does not quite fit that either.  Treated with DuoNeb which seemed to help with her breathing but she persistently had oxygen saturations in 86 to 88% range at rest requiring 2 L nasal cannula to stay above 92%.  Will admit to hospitalist for further management and hopefully echocardiogram to rule out pericarditis or other cardiac abnormality.    Pertinent labs & imaging results that were available during my care of the patient were reviewed by me and considered in my medical decision making (see chart for details).  ____________________________________________  FINAL  CLINICAL IMPRESSION(S) / ED DIAGNOSES  Final diagnoses:  Hypoxia  Chest pain, unspecified type    MEDICATIONS GIVEN DURING THIS VISIT:  Medications  escitalopram (LEXAPRO) tablet 10 mg (10 mg Oral Given 10/19/17 1020)  Eluxadoline TABS 75 mg (75 mg Oral Not Given 10/19/17 1026)  pantoprazole (PROTONIX) EC tablet 40 mg (40 mg Oral Given 10/19/17 1021)  mometasone-formoterol (DULERA) 200-5 MCG/ACT inhaler 2 puff (2 puffs Inhalation Not Given 10/19/17 0305)  albuterol (PROVENTIL) (2.5 MG/3ML) 0.083% nebulizer solution 3 mL (has no administration in time range)  ondansetron (ZOFRAN) tablet 4 mg (has no administration in time range)    Or  ondansetron (ZOFRAN) injection 4 mg (has no administration in time range)  predniSONE (DELTASONE) tablet 50 mg (50 mg Oral Given 10/19/17 1020)  0.9 %  sodium chloride infusion ( Intravenous Stopped 10/19/17 1447)  MEDLINE mouth rinse (15 mLs Mouth Rinse Given 10/19/17 1026)  enoxaparin (LOVENOX) injection 30 mg (has no administration in time range)  iopamidol (ISOVUE-370) 76 % injection 60 mL (60 mLs Intravenous Contrast Given 10/18/17 2031)  predniSONE (DELTASONE) tablet 60 mg (60 mg Oral Given 10/18/17 2151)  aspirin chewable tablet 324 mg (324 mg Oral Given 10/18/17 2236)  ketorolac (TORADOL) 30 MG/ML injection 15 mg (15 mg Intravenous Given 10/18/17 2237)    NEW OUTPATIENT MEDICATIONS STARTED DURING THIS VISIT:  Discharge Medication List as of 10/19/2017  2:39 PM    START taking these medications   Details  predniSONE (DELTASONE) 10 MG tablet Take 40mg  po daily for 2 days then 30mg  daily for 2 days then 20mg  daily for 2 days then 10mg  daily for 2 days then stop, Normal        Note:  This note was prepared with assistance of Dragon voice recognition software. Occasional wrong-word or sound-a-like substitutions may have occurred due to the inherent limitations of voice recognition software.   Marily Memos, MD 10/19/17 [phone]

## 2017-10-18 NOTE — ED Notes (Signed)
CRITICAL VALUE ALERT  Critical Value:  Troponin 0.05  Date & Time Notied:  10/18/17 1750  Provider Notified: Effie ShyWentz  Orders Received/Actions taken: continue to monitor.

## 2017-10-19 ENCOUNTER — Observation Stay (HOSPITAL_BASED_OUTPATIENT_CLINIC_OR_DEPARTMENT_OTHER): Payer: BLUE CROSS/BLUE SHIELD

## 2017-10-19 DIAGNOSIS — R0902 Hypoxemia: Secondary | ICD-10-CM | POA: Diagnosis not present

## 2017-10-19 DIAGNOSIS — R0781 Pleurodynia: Secondary | ICD-10-CM

## 2017-10-19 DIAGNOSIS — R079 Chest pain, unspecified: Secondary | ICD-10-CM | POA: Diagnosis not present

## 2017-10-19 DIAGNOSIS — R700 Elevated erythrocyte sedimentation rate: Secondary | ICD-10-CM

## 2017-10-19 DIAGNOSIS — I10 Essential (primary) hypertension: Secondary | ICD-10-CM

## 2017-10-19 DIAGNOSIS — R748 Abnormal levels of other serum enzymes: Secondary | ICD-10-CM

## 2017-10-19 DIAGNOSIS — K580 Irritable bowel syndrome with diarrhea: Secondary | ICD-10-CM

## 2017-10-19 DIAGNOSIS — J45909 Unspecified asthma, uncomplicated: Secondary | ICD-10-CM | POA: Diagnosis present

## 2017-10-19 LAB — SEDIMENTATION RATE: SED RATE: 62 mm/h — AB (ref 0–22)

## 2017-10-19 LAB — CBC
HCT: 33.2 % — ABNORMAL LOW (ref 36.0–46.0)
Hemoglobin: 11 g/dL — ABNORMAL LOW (ref 12.0–15.0)
MCH: 31.1 pg (ref 26.0–34.0)
MCHC: 33.1 g/dL (ref 30.0–36.0)
MCV: 93.8 fL (ref 78.0–100.0)
Platelets: 268 10*3/uL (ref 150–400)
RBC: 3.54 MIL/uL — ABNORMAL LOW (ref 3.87–5.11)
RDW: 13.1 % (ref 11.5–15.5)
WBC: 2.4 10*3/uL — ABNORMAL LOW (ref 4.0–10.5)

## 2017-10-19 LAB — COMPREHENSIVE METABOLIC PANEL
ALBUMIN: 3.1 g/dL — AB (ref 3.5–5.0)
ALT: 12 U/L (ref 0–44)
ANION GAP: 6 (ref 5–15)
AST: 21 U/L (ref 15–41)
Alkaline Phosphatase: 76 U/L (ref 38–126)
BILIRUBIN TOTAL: 0.6 mg/dL (ref 0.3–1.2)
BUN: 17 mg/dL (ref 8–23)
CALCIUM: 8.9 mg/dL (ref 8.9–10.3)
CO2: 26 mmol/L (ref 22–32)
Chloride: 106 mmol/L (ref 98–111)
Creatinine, Ser: 0.95 mg/dL (ref 0.44–1.00)
GFR calc non Af Amer: >60 mL/min (ref >60)
GLUCOSE: 154 mg/dL — AB (ref 70–99)
POTASSIUM: 4.2 mmol/L (ref 3.5–5.1)
SODIUM: 138 mmol/L (ref 135–145)
TOTAL PROTEIN: 7.4 g/dL (ref 6.5–8.1)

## 2017-10-19 LAB — C-REACTIVE PROTEIN

## 2017-10-19 LAB — GLUCOSE, CAPILLARY
GLUCOSE-CAPILLARY: 123 mg/dL — AB (ref 70–99)
Glucose-Capillary: 140 mg/dL — ABNORMAL HIGH (ref 70–99)
Glucose-Capillary: 127 mg/dL — ABNORMAL HIGH (ref 70–99)

## 2017-10-19 LAB — ECHOCARDIOGRAM COMPLETE
HEIGHTINCHES: 62 in
WEIGHTICAEL: 1552.04 [oz_av]

## 2017-10-19 LAB — TROPONIN I
TROPONIN I: 0.04 ng/mL — AB (ref <0.03)
Troponin I: 0.04 ng/mL (ref <0.03)

## 2017-10-19 MED ORDER — ONDANSETRON HCL 4 MG/2ML IJ SOLN: 4.0000 mg | Freq: Four times a day (QID) | INTRAMUSCULAR | Status: DC | PRN

## 2017-10-19 MED ORDER — ALBUTEROL SULFATE (2.5 MG/3ML) 0.083% IN NEBU: 3.0000 mL | INHALATION_SOLUTION | Freq: Four times a day (QID) | RESPIRATORY_TRACT | Status: DC | PRN

## 2017-10-19 MED ORDER — PREDNISONE 10 MG PO TABS: ORAL_TABLET | ORAL | 0 refills | Status: DC

## 2017-10-19 MED ORDER — PREDNISONE 20 MG PO TABS
50.0000 mg | ORAL_TABLET | Freq: Every day | ORAL | Status: DC
  Administered 2017-10-19: 50 mg via ORAL
  Filled 2017-10-19: qty 2

## 2017-10-19 MED ORDER — ENOXAPARIN SODIUM 30 MG/0.3ML ~~LOC~~ SOLN: 30.0000 mg | SUBCUTANEOUS | Status: DC

## 2017-10-19 MED ORDER — SODIUM CHLORIDE 0.9 % IV SOLN
INTRAVENOUS | Status: DC
  Administered 2017-10-19: 01:00:00 via INTRAVENOUS

## 2017-10-19 MED ORDER — PANTOPRAZOLE SODIUM 40 MG PO TBEC
40.0000 mg | DELAYED_RELEASE_TABLET | Freq: Every day | ORAL | Status: DC
  Administered 2017-10-19: 40 mg via ORAL
  Filled 2017-10-19: qty 1

## 2017-10-19 MED ORDER — ONDANSETRON HCL 4 MG PO TABS: 4.0000 mg | ORAL_TABLET | Freq: Four times a day (QID) | ORAL | Status: DC | PRN

## 2017-10-19 MED ORDER — SODIUM CHLORIDE 0.9 % IV SOLN: INTRAVENOUS | Status: DC

## 2017-10-19 MED ORDER — ELUXADOLINE 75 MG PO TABS: 75.0000 mg | ORAL_TABLET | Freq: Two times a day (BID) | ORAL | Status: DC

## 2017-10-19 MED ORDER — ORAL CARE MOUTH RINSE
15.0000 mL | Freq: Two times a day (BID) | OROMUCOSAL | Status: DC
  Administered 2017-10-19 (×2): 15 mL via OROMUCOSAL

## 2017-10-19 MED ORDER — NEBIVOLOL HCL 10 MG PO TABS
10.0000 mg | ORAL_TABLET | Freq: Every day | ORAL | Status: DC
  Filled 2017-10-19: qty 1

## 2017-10-19 MED ORDER — ENOXAPARIN SODIUM 40 MG/0.4ML ~~LOC~~ SOLN
40.0000 mg | SUBCUTANEOUS | Status: DC
  Administered 2017-10-19: 40 mg via SUBCUTANEOUS
  Filled 2017-10-19: qty 0.4

## 2017-10-19 MED ORDER — SODIUM CHLORIDE 0.9 % IV SOLN
INTRAVENOUS | Status: DC
  Administered 2017-10-19: 10:00:00 via INTRAVENOUS

## 2017-10-19 MED ORDER — ESCITALOPRAM OXALATE 10 MG PO TABS
10.0000 mg | ORAL_TABLET | Freq: Every morning | ORAL | Status: DC
  Administered 2017-10-19: 10 mg via ORAL
  Filled 2017-10-19: qty 1

## 2017-10-19 MED ORDER — MOMETASONE FURO-FORMOTEROL FUM 200-5 MCG/ACT IN AERO
2.0000 | INHALATION_SPRAY | Freq: Two times a day (BID) | RESPIRATORY_TRACT | Status: DC
  Filled 2017-10-19: qty 8.8

## 2017-10-19 NOTE — Discharge Summary (Signed)
Physician Discharge Summary  Grace Cross ZOX:[id number] DOB: [birth year] DOA: 10/18/2017  PCP: [name], MD  Admit date: 10/18/2017 Discharge date: 10/19/2017  Admitted From: Home Disposition: Home  Recommendations for Outpatient Follow-up:  1. Follow up with PCP in 1-2 weeks  Discharge Condition: Stable CODE STATUS: Full code Diet recommendation: Heart healthy  Brief/Interim Summary: 62 year old female admitted to the hospital with complaints of chest discomfort.  She reported having a recent upper respiratory tract infection last week.  She has been coughing up sputum.  She described pain in her anterior chest which radiated around her right breast.  This was worse with cough and deep inspiration.  On arrival to the emergency room, EKG showed slight upsloping of the ST segments along the inferior and lateral leads which was also noted on prior tracings.  Troponins were found to be 0.05, 0.05, 0.04.  Sed rate was elevated at 62.  There was concern that she may have underlying pericarditis.  CT Angie of the chest did not show any underlying pulmonary embolus.  She was admitted to the hospital and seen by cardiology.  She had an echocardiogram that was unrevealing.  After receiving steroids, she reports her symptoms had significantly improved.  It is possible that she has a pleuritis.  Pericarditis was felt to be unlikely.  She will be prescribed a course of prednisone.  She is feeling significantly better and feels ready for discharge home.  Discharge Diagnoses:  Active Problems:   IBS (irritable bowel syndrome)   Chest pain   HTN (hypertension)   Asthma    Discharge Instructions  Discharge Instructions    Diet - low sodium heart healthy   Complete by:  As directed    Increase activity slowly   Complete by:  As directed      Allergies as of 10/19/2017      Reactions   Penicillins Rash      Medication List    STOP taking these medications   hydroxychloroquine 200 MG  tablet Commonly known as:  PLAQUENIL     TAKE these medications   ADVAIR DISKUS 250-50 MCG/DOSE Aepb Generic drug:  Fluticasone-Salmeterol Inhale 1 puff into the lungs 2 (two) times daily.   BYSTOLIC 20 MG Tabs Generic drug:  Nebivolol HCl Take 0.5 tablets by mouth daily.   Eluxadoline 75 MG Tabs Take 75 mg by mouth 2 (two) times daily.   escitalopram 10 MG tablet Commonly known as:  LEXAPRO Take 10 mg by mouth every morning.   ibuprofen 800 MG tablet Commonly known as:  ADVIL,MOTRIN Take 800 mg by mouth as needed.   omeprazole 20 MG capsule Commonly known as:  PRILOSEC TAKE ONE CAPSULE BY MOUTH TWICE DAILY BEFORE A MEAL   predniSONE 10 MG tablet Commonly known as:  DELTASONE Take 40mg  po daily for 2 days then 30mg  daily for 2 days then 20mg  daily for 2 days then 10mg  daily for 2 days then stop   PROAIR HFA 108 (90 Base) MCG/ACT inhaler Generic drug:  albuterol Inhale 2 puffs into the lungs every 6 (six) hours as needed. For shortness of breath   traMADol 50 MG tablet Commonly known as:  ULTRAM Take 50 mg by mouth 3 times/day as needed-between meals & bedtime.      Follow-up Information    [name], MD. Schedule an appointment as soon as possible for a visit on 11/05/2017.   Specialty:  Internal Medicine Why:  3:40 with the nurse practitioner Contact information:  [address] [place] [id number] [phone]          Allergies  Allergen Reactions  . Penicillins Rash    Consultations:  Cardiology   Procedures/Studies: Dg Chest 2 View  Result Date: 10/18/2017 CLINICAL DATA:  Chest pain for 1 week EXAM: CHEST - 2 VIEW COMPARISON:  06/17/2013 FINDINGS: There is mild lingular scarring. There is no focal parenchymal opacity. There is no pleural effusion or pneumothorax. The heart and mediastinal contours are unremarkable. The osseous structures are unremarkable. IMPRESSION: No active cardiopulmonary disease. Electronically Signed   By: Elige KoHetal   Patel   On: 10/18/2017 16:19   Ct Angio Chest Pe W And/or Wo Contrast  Result Date: 10/18/2017 CLINICAL DATA:  Chest pain and cough EXAM: CT ANGIOGRAPHY CHEST WITH CONTRAST TECHNIQUE: Multidetector CT imaging of the chest was performed using the standard protocol during bolus administration of intravenous contrast. Multiplanar CT image reconstructions and MIPs were obtained to evaluate the vascular anatomy. CONTRAST:  60mL ISOVUE-370 IOPAMIDOL (ISOVUE-370) INJECTION 76% COMPARISON:  Chest CT February 11, 2009; chest radiograph October 18, 2017 FINDINGS: Cardiovascular: There is no demonstrable pulmonary embolus. There is no thoracic aortic aneurysm or dissection. The visualized great vessels appear unremarkable. There are foci aortic atherosclerosis. No pericardial effusion or pericardial thickening is evident. The main pulmonary outflow tract measures 3.8 cm which is prominent. Mediastinum/Nodes: Thyroid appears unremarkable. There is no appreciable thoracic adenopathy. No esophageal lesions are evident. Lungs/Pleura: There is underlying centrilobular emphysematous change. There is mild scarring and atelectatic change in the bases. There is no edema or consolidation. There is no evident pleural effusion or pleural thickening. There is slight lower lobe bronchiectatic change. Upper Abdomen: Visualized upper abdominal structures appear unremarkable. Musculoskeletal: There are no blastic or lytic bone lesions. No chest wall lesions are evident. Review of the MIP images confirms the above findings. IMPRESSION: 1. No demonstrable pulmonary embolus. No thoracic aortic aneurysm or dissection. There is thoracic aortic atherosclerosis. 2. There is prominence of the main pulmonary outflow tract indicative of a degree of pulmonary arterial hypertension. 3. Centrilobular emphysematous change. No edema or consolidation. Mild scarring and atelectasis in the lung bases. There is also mild lower lobe bronchiectatic change. 4.   No evident thoracic adenopathy. Aortic Atherosclerosis (ICD10-I70.0) and Emphysema (ICD10-J43.9). Electronically Signed   By: Bretta BangWilliam  Woodruff III M.D.   On: 10/18/2017 20:43       Subjective: Feeling better.  Chest pain is better.  No shortness of breath.  Discharge Exam: Vitals:   10/18/17 2358 10/19/17 0104 10/19/17 0136 10/19/17 0607  BP:  (!) 86/50 (!) 88/68 113/84  Pulse:  72 79 76  Resp:  15 16 19   Temp:   98 F (36.7 C) 98.2 F (36.8 C)  TempSrc:   Oral Oral  SpO2: 100% 98% 100% 100%  Weight:   44 kg   Height:        General: Pt is alert, awake, not in acute distress Cardiovascular: RRR, S1/S2 +, no rubs, no gallops Respiratory: CTA bilaterally, no wheezing, no rhonchi Abdominal: Soft, NT, ND, bowel sounds + Extremities: no edema, no cyanosis    The results of significant diagnostics from this hospitalization (including imaging, microbiology, ancillary and laboratory) are listed below for reference.     Microbiology: No results found for this or any previous visit (from the past 240 hour(s)).   Labs: BNP (last 3 results) Recent Labs    10/18/17 1640  BNP 49.0   Basic Metabolic Panel: Recent  Labs  Lab 10/18/17 1640 10/19/17 0725  NA 138 138  K 4.5 4.2  CL 103 106  CO2 29 26  GLUCOSE 99 154*  BUN 20 17  CREATININE 1.07* 0.95  CALCIUM 9.5 8.9   Liver Function Tests: Recent Labs  Lab 10/19/17 0725  AST 21  ALT 12  ALKPHOS 76  BILITOT 0.6  PROT 7.4  ALBUMIN 3.1*   No results for input(s): LIPASE, AMYLASE in the last 168 hours. No results for input(s): AMMONIA in the last 168 hours. CBC: Recent Labs  Lab 10/18/17 1640 10/19/17 0725  WBC 5.5 2.4*  HGB 12.2 11.0*  HCT 37.1 33.2*  MCV 95.4 93.8  PLT 278 268   Cardiac Enzymes: Recent Labs  Lab 10/18/17 1640 10/18/17 2005 10/19/17 0152 10/19/17 0725  TROPONINI 0.05* 0.05* 0.04* 0.04*   BNP: Invalid input(s): POCBNP CBG: Recent Labs  Lab 10/19/17 0136 10/19/17 0723  10/19/17 1122  GLUCAP 123* 140* 127*   D-Dimer No results for input(s): DDIMER in the last 72 hours. Hgb A1c No results for input(s): HGBA1C in the last 72 hours. Lipid Profile No results for input(s): CHOL, HDL, LDLCALC, TRIG, CHOLHDL, LDLDIRECT in the last 72 hours. Thyroid function studies No results for input(s): TSH, T4TOTAL, T3FREE, THYROIDAB in the last 72 hours.  Invalid input(s): FREET3 Anemia work up No results for input(s): VITAMINB12, FOLATE, FERRITIN, TIBC, IRON, RETICCTPCT in the last 72 hours. Urinalysis    Component Value Date/Time   COLORURINE YELLOW 01/24/2017 1701   APPEARANCEUR HAZY (A) 01/24/2017 1701   LABSPEC 1.024 01/24/2017 1701   PHURINE 5.0 01/24/2017 1701   GLUCOSEU NEGATIVE 01/24/2017 1701   HGBUR MODERATE (A) 01/24/2017 1701   BILIRUBINUR NEGATIVE 01/24/2017 1701   KETONESUR NEGATIVE 01/24/2017 1701   PROTEINUR 30 (A) 01/24/2017 1701   UROBILINOGEN 0.2 06/17/2013 1638   NITRITE NEGATIVE 01/24/2017 1701   LEUKOCYTESUR SMALL (A) 01/24/2017 1701   Sepsis Labs Invalid input(s): PROCALCITONIN,  WBC,  LACTICIDVEN Microbiology No results found for this or any previous visit (from the past 240 hour(s)).   Time coordinating discharge:  SIGNED:   Erick Blinks, MD  [facility] 10/19/2017, 5:36 PM Pager   If 7PM-7AM, please contact night-coverage [email or web address] Password [email or web address]

## 2017-10-19 NOTE — H&P (Signed)
TRH H&P    Patient Demographics:    Grace Cross, is a 62 y.o. female  MRN: [id number]  DOB - [birth year]  Admit Date - 10/18/2017  Referring MD/NP/PA: Dr. Dayna Barker  Outpatient Primary MD for the patient is Celene Squibb, MD  Patient coming from: PCP clinic  Chief complaint-chest pain   HPI:    Grace Cross  is a 62 y.o. female, with history of hypertension, asthma came to hospital with chief complaint of chest pain.  Patient also has been coughing up phlegm.  She was seen at PCP office.  EKG showed abnormality and patient was sent to ED for further evaluation.  In the ED patient was given prednisone 60 mg p.o. x1, ketorolac IV.  EKG showed diffuse ST elevation, patient had initial chest x-ray which was unremarkable.  CT a chest showed no pulmonary embolism.  It showed prominence of the main pulmonary artery outflow tract indicative of degree of pulmonary artery hypertension, no pericardial effusion. Chest pain has resolved. She denies diarrhea at this time.  Does get intermittent diarrhea last bowel movement  was on Monday night. Denies shortness of breath.  Patient became hypoxic to the ED with O2 sats 80% requiring 2 L of oxygen by nasal cannula. Denies fever or chills. No dysuria urgency or frequency of urination.   Review of systems:    In addition to the HPI above,    All other systems reviewed and are negative.   With Past History of the following :    Past Medical History:  Diagnosis Date  . Acid reflux   . Asthma   . Cardiac arrhythmia   . Hypertension       Past Surgical History:  Procedure Laterality Date  . COLONOSCOPY, ESOPHAGOGASTRODUODENOSCOPY (EGD) AND ESOPHAGEAL DILATION N/A 06/17/2012   UYQ:IHKV IN CECUM/Mod IH/ESO ring/Gastritis  . none        Social History:      Social History   Tobacco Use  . Smoking status: Former Smoker    Types: Cigarettes  . Smokeless tobacco:  Never Used  . Tobacco comment: Quit x 15-20 years  Substance Use Topics  . Alcohol use: Yes    Comment: occ       Family History :     Family History  Problem Relation Age of Onset  . Colon cancer Neg Hx   . Inflammatory bowel disease Neg Hx       Home Medications:   Prior to Admission medications   Medication Sig Start Date End Date Taking? Authorizing Provider  ADVAIR DISKUS 250-50 MCG/DOSE AEPB Inhale 1 puff into the lungs 2 (two) times daily.  12/14/16  Yes [provider]  albuterol (PROAIR HFA) 108 (90 BASE) MCG/ACT inhaler Inhale 2 puffs into the lungs every 6 (six) hours as needed. For shortness of breath   Yes [provider]  BYSTOLIC 20 MG TABS Take 0.5 tablets by mouth daily.  02/10/17  Yes [provider]  Eluxadoline (VIBERZI) 75 MG TABS Take 75 mg by mouth 2 (two) times  daily. 04/18/17  Yes Fields, Sandi L, MD  escitalopram (LEXAPRO) 10 MG tablet Take 10 mg by mouth every morning.    Yes [provider]  omeprazole (PRILOSEC) 20 MG capsule TAKE ONE CAPSULE BY MOUTH TWICE DAILY BEFORE A MEAL 10/10/16  Yes Carlis Stable, NP  hydroxychloroquine (PLAQUENIL) 200 MG tablet Take 200 mg by mouth daily as needed.  06/05/13   [provider]  ibuprofen (ADVIL,MOTRIN) 800 MG tablet Take 800 mg by mouth as needed.  02/02/17   [provider]  traMADol (ULTRAM) 50 MG tablet Take 50 mg by mouth 3 times/day as needed-between meals & bedtime.  02/28/17   [provider]     Allergies:     Allergies  Allergen Reactions  . Penicillins Rash     Physical Exam:   Vitals  Blood pressure 92/70, pulse 72, temperature 98.4 F (36.9 C), temperature source Oral, resp. rate 15, height 5' 2"  (1.575 m), weight 44 kg, SpO2 100 %.  1.  General: Appears in no acute distress  2. Psychiatric:  Intact judgement and  insight, awake alert, oriented x 3.  3. Neurologic: No focal neurological deficits, all cranial nerves  intact.Strength 5/5 all 4 extremities, sensation intact all 4 extremities, plantars down going.  4. Eyes :  anicteric sclerae, moist conjunctivae with no lid lag. PERRLA.  5. ENMT:  Oropharynx clear with moist mucous membranes and good dentition  6. Neck:  supple, no cervical lymphadenopathy appriciated, No thyromegaly  7. Respiratory : Normal respiratory effort, good air movement bilaterally,clear to  auscultation bilaterally  8. Cardiovascular : RRR, no gallops, rubs or murmurs, no leg edema  9. Gastrointestinal:  Positive bowel sounds, abdomen soft, non-tender to palpation,no hepatosplenomegaly, no rigidity or guarding       10. Skin:  No cyanosis, normal texture and turgor, no rash, lesions or ulcers  11.Musculoskeletal:  Good muscle tone,  joints appear normal , no effusions,  normal range of motion    Data Review:    CBC Recent Labs  Lab 10/18/17 1640  WBC 5.5  HGB 12.2  HCT 37.1  PLT 278  MCV 95.4  MCH 31.4  MCHC 32.9  RDW 13.3   ------------------------------------------------------------------------------------------------------------------  Results for orders placed or performed during the hospital encounter of 10/18/17 (from the past 48 hour(s))  Basic metabolic panel     Status: Abnormal   Collection Time: 10/18/17  4:40 PM  Result Value Ref Range   Sodium 138 135 - 145 mmol/L   Potassium 4.5 3.5 - 5.1 mmol/L   Chloride 103 98 - 111 mmol/L   CO2 29 22 - 32 mmol/L   Glucose, Bld 99 70 - 99 mg/dL   BUN 20 8 - 23 mg/dL   Creatinine, Ser 1.07 (H) 0.44 - 1.00 mg/dL   Calcium 9.5 8.9 - 10.3 mg/dL   GFR calc non Af Amer 55 (L) >60 mL/min   GFR calc Af Amer >60 >60 mL/min    Comment: (NOTE) The eGFR has been calculated using the CKD EPI equation. This calculation has not been validated in all clinical situations. eGFR's persistently <60 mL/min signify possible Chronic Kidney Disease.    Anion gap 6 5 - 15    Comment: Performed at [facility], [address] [place], [place] [address]  CBC     Status: None   Collection Time: 10/18/17  4:40 PM  Result Value Ref Range   WBC 5.5 4.0 - 10.5 K/uL   RBC  3.89 3.87 - 5.11 MIL/uL   Hemoglobin 12.2 12.0 - 15.0 g/dL   HCT 37.1 36.0 - 46.0 %   MCV 95.4 78.0 - 100.0 fL   MCH 31.4 26.0 - 34.0 pg   MCHC 32.9 30.0 - 36.0 g/dL   RDW 13.3 11.5 - 15.5 %   Platelets 278 150 - 400 K/uL    Comment: Performed at [facility], [address], [place], [place] [address]  Troponin I     Status: Abnormal   Collection Time: 10/18/17  4:40 PM  Result Value Ref Range   Troponin I 0.05 (HH) <0.03 ng/mL    Comment: CRITICAL RESULT CALLED TO, READ BACK BY AND VERIFIED WITH: EASTWOOD,J AT 1750 ON 9.19.2019 BY ISLEY,B Performed at [facility], [address] [place], [place] [address]   Brain natriuretic peptide     Status: None   Collection Time: 10/18/17  4:40 PM  Result Value Ref Range   B Natriuretic Peptide 49.0 0.0 - 100.0 pg/mL    Comment: Performed at [facility], [address] [place], [place] [address]  Troponin I     Status: Abnormal   Collection Time: 10/18/17  8:05 PM  Result Value Ref Range   Troponin I 0.05 (HH) <0.03 ng/mL    Comment: CRITICAL VALUE NOTED.  VALUE IS CONSISTENT WITH PREVIOUSLY REPORTED AND CALLED VALUE. Performed at [facility], [address] [place], [place] [address]     Chemistries  Recent Labs  Lab 10/18/17 1640  NA 138  K 4.5  CL 103  CO2 29  GLUCOSE 99  BUN 20  CREATININE 1.07*  CALCIUM 9.5   ------------------------------------------------------------------------------------------------------------------  ------------------------------------------------------------------------------------------------------------------ GFR: Estimated Creatinine Clearance: 37.9 mL/min (A) (by C-G formula based on SCr of 1.07 mg/dL (H)). Liver Function Tests: No results for input(s): AST, ALT, ALKPHOS, BILITOT, PROT, ALBUMIN in the last 168 hours. No results  for input(s): LIPASE, AMYLASE in the last 168 hours. No results for input(s): AMMONIA in the last 168 hours. Coagulation Profile: No results for input(s): INR, PROTIME in the last 168 hours. Cardiac Enzymes: Recent Labs  Lab 10/18/17 1640 10/18/17 2005  TROPONINI 0.05* 0.05*   BNP (last 3 results) No results for input(s): PROBNP in the last 8760 hours. HbA1C: No results for input(s): HGBA1C in the last 72 hours. CBG: No results for input(s): GLUCAP in the last 168 hours. Lipid Profile: No results for input(s): CHOL, HDL, LDLCALC, TRIG, CHOLHDL, LDLDIRECT in the last 72 hours. Thyroid Function Tests: No results for input(s): TSH, T4TOTAL, FREET4, T3FREE, THYROIDAB in the last 72 hours. Anemia Panel: No results for input(s): VITAMINB12, FOLATE, FERRITIN, TIBC, IRON, RETICCTPCT in the last 72 hours.  --------------------------------------------------------------------------------------------------------------- Urine analysis:    Component Value Date/Time   COLORURINE YELLOW 01/24/2017 1701   APPEARANCEUR HAZY (A) 01/24/2017 1701   LABSPEC 1.024 01/24/2017 1701   PHURINE 5.0 01/24/2017 1701   GLUCOSEU NEGATIVE 01/24/2017 1701   HGBUR MODERATE (A) 01/24/2017 1701   BILIRUBINUR NEGATIVE 01/24/2017 1701   KETONESUR NEGATIVE 01/24/2017 1701   PROTEINUR 30 (A) 01/24/2017 1701   UROBILINOGEN 0.2 06/17/2013 1638   NITRITE NEGATIVE 01/24/2017 1701   LEUKOCYTESUR SMALL (A) 01/24/2017 1701      Imaging Results:    Dg Chest 2 View  Result Date: 10/18/2017 CLINICAL DATA:  Chest pain for 1 week EXAM: CHEST - 2 VIEW COMPARISON:  06/17/2013 FINDINGS: There is mild lingular scarring. There is no focal parenchymal opacity. There is no pleural effusion or pneumothorax. The heart  and mediastinal contours are unremarkable. The osseous structures are unremarkable. IMPRESSION: No active cardiopulmonary disease. Electronically Signed   By: Kathreen Devoid   On: 10/18/2017 16:19   Ct Angio Chest Pe  W And/or Wo Contrast  Result Date: 10/18/2017 CLINICAL DATA:  Chest pain and cough EXAM: CT ANGIOGRAPHY CHEST WITH CONTRAST TECHNIQUE: Multidetector CT imaging of the chest was performed using the standard protocol during bolus administration of intravenous contrast. Multiplanar CT image reconstructions and MIPs were obtained to evaluate the vascular anatomy. CONTRAST:  60m ISOVUE-370 IOPAMIDOL (ISOVUE-370) INJECTION 76% COMPARISON:  Chest CT February 11, 2009; chest radiograph October 18, 2017 FINDINGS: Cardiovascular: There is no demonstrable pulmonary embolus. There is no thoracic aortic aneurysm or dissection. The visualized great vessels appear unremarkable. There are foci aortic atherosclerosis. No pericardial effusion or pericardial thickening is evident. The main pulmonary outflow tract measures 3.8 cm which is prominent. Mediastinum/Nodes: Thyroid appears unremarkable. There is no appreciable thoracic adenopathy. No esophageal lesions are evident. Lungs/Pleura: There is underlying centrilobular emphysematous change. There is mild scarring and atelectatic change in the bases. There is no edema or consolidation. There is no evident pleural effusion or pleural thickening. There is slight lower lobe bronchiectatic change. Upper Abdomen: Visualized upper abdominal structures appear unremarkable. Musculoskeletal: There are no blastic or lytic bone lesions. No chest wall lesions are evident. Review of the MIP images confirms the above findings. IMPRESSION: 1. No demonstrable pulmonary embolus. No thoracic aortic aneurysm or dissection. There is thoracic aortic atherosclerosis. 2. There is prominence of the main pulmonary outflow tract indicative of a degree of pulmonary arterial hypertension. 3. Centrilobular emphysematous change. No edema or consolidation. Mild scarring and atelectasis in the lung bases. There is also mild lower lobe bronchiectatic change. 4.  No evident thoracic adenopathy. Aortic  Atherosclerosis (ICD10-I70.0) and Emphysema (ICD10-J43.9). Electronically Signed   By: WLowella GripIII M.D.   On: 10/18/2017 20:43    My personal review of EKG: Rhythm NSR, minimal diffuse ST elevation    Assessment & Plan:    Active Problems:   Chest pain   1. Chest pain-resolved, concern for pericarditis.  Given prednisone 60 mg p.o. x1 and IV ketorolac in the ED.  Continue prednisone 50 mg p.o. daily.  Will obtain echocardiogram.  CTA shows concern for pulmonary artery hypertension.  She also has elevated troponin.  Concern for myopericarditis.  Will obtain serial troponin.  Check CRP, ESR, ANA.  2. Hypertension-blood pressure stable, continue Bystolic  3. COPD-no acute exacerbation but patient became hypoxic in the ED.  Continue oxygen via nasal cannula.    DVT Prophylaxis-   Lovenox   AM Labs Ordered, also please review Full Orders  Family Communication: Admission, patients condition and plan of care including tests being ordered have been discussed with the patient * who indicate understanding and agree with the plan and Code Status.  Code Status: Full code  Admission status: Observation  Time spent in minutes : 60 minutes   GOswald HillockM.D on 10/19/2017 at 12:32 AM  Between 7am to 7pm - Pager - [phone] After 7pm go to [email or web address] - password [facility] - Office  [phone]

## 2017-10-19 NOTE — Consult Note (Addendum)
Cardiology Consult    Patient ID: Grace Cross; [id number]; Jun 26, 1955   Admit date: 10/18/2017 Date of Consult: 10/19/2017  Primary Care Provider: Celene Squibb, MD Primary Cardiologist: New to [facility] - Dr. Domenic Polite   Patient Profile    Grace Cross is a 62 y.o. female with past medical history of HTN, asthma, and no prior cardiac history who is being seen today for the evaluation of chest pain at the request of Dr. Darrick Meigs.   History of Present Illness    Grace Cross presented to [facility] ED on 10/18/2017 for evaluation of chest discomfort and a productive cough.   The patient reports having fever, chills, and a productive cough over the past week. Around that same timeframe, she started to notice sharp chest discomfort along her sternal region which is typically worse with coughing but has also occurred when lying down to go to sleep at night. She reports the pain typically lasts for several minutes and then eases off gradually but can last up to an hour. She is very active at baseline at her job, as she lifts, pushes, and pulls boxes for the duration of her 8-hour shift. She does note occasional episodes of pain while at work if she "lifts something that is too heavy".  She denies any recent orthopnea, PND, lower extremity edema, or palpitations.  No personal history of CAD. She has known HTN but denies any HLD or Type II DM. Reports her father had "heart issues" and died in his 65's. She is a former smoker but quit 10+ years ago.   Initial labs showed WBC 5.5, Hgb 12.2, platelets 278, Na+ 138, K+ 4.5, and creatinine 1.07.  BNP 49. Initial and cyclic troponin values have been flat at 0.05, 0.05, and 0.04. Sed rate elevated to 62.  CRP is pending. EKG shows NSR, HR 77, with slight upsloping of the ST segment along the inferior and lateral leads which is noted on prior tracings and likely due to repol. CXR shows no active cardiopulmonary disease. CTA shows no evidence of a PE but she was noted  to have thoracic aortic atherosclerosis and prominence of the main pulmonary outflow tract consistent with pulmonary arterial hypertension.  No evidence of edema or consolidation.   Past Medical History:  Diagnosis Date  . Acid reflux   . Asthma   . Cardiac arrhythmia   . Hypertension     Past Surgical History:  Procedure Laterality Date  . COLONOSCOPY, ESOPHAGOGASTRODUODENOSCOPY (EGD) AND ESOPHAGEAL DILATION N/A 06/17/2012   UTM:LYYT IN CECUM/Mod IH/ESO ring/Gastritis  . none       Home Medications:  Prior to Admission medications   Medication Sig Start Date End Date Taking? Authorizing Provider  ADVAIR DISKUS 250-50 MCG/DOSE AEPB Inhale 1 puff into the lungs 2 (two) times daily.  12/14/16  Yes [provider]  albuterol (PROAIR HFA) 108 (90 BASE) MCG/ACT inhaler Inhale 2 puffs into the lungs every 6 (six) hours as needed. For shortness of breath   Yes [provider]  BYSTOLIC 20 MG TABS Take 0.5 tablets by mouth daily.  02/10/17  Yes [provider]  Eluxadoline (VIBERZI) 75 MG TABS Take 75 mg by mouth 2 (two) times daily. 04/18/17  Yes Fields, Sandi L, MD  escitalopram (LEXAPRO) 10 MG tablet Take 10 mg by mouth every morning.    Yes [provider]  omeprazole (PRILOSEC) 20 MG capsule TAKE ONE CAPSULE BY MOUTH TWICE DAILY BEFORE A MEAL 10/10/16  Yes Carlis Stable, NP  hydroxychloroquine (PLAQUENIL) 200 MG tablet Take 200 mg by mouth daily as needed.  06/05/13   [provider]  ibuprofen (ADVIL,MOTRIN) 800 MG tablet Take 800 mg by mouth as needed.  02/02/17   [provider]  traMADol (ULTRAM) 50 MG tablet Take 50 mg by mouth 3 times/day as needed-between meals & bedtime.  02/28/17   [provider]    Inpatient Medications: Scheduled Meds: . Eluxadoline  75 mg Oral BID  . enoxaparin (LOVENOX) injection  40 mg Subcutaneous Q24H  . escitalopram  10 mg Oral q morning - 10a  . mouth rinse  15 mL Mouth Rinse BID  .  mometasone-formoterol  2 puff Inhalation BID  . nebivolol  10 mg Oral Daily  . pantoprazole  40 mg Oral Daily  . predniSONE  50 mg Oral Q breakfast   Continuous Infusions: . sodium chloride 125 mL/hr at 10/19/17 0158   PRN Meds: albuterol, ondansetron **OR** ondansetron (ZOFRAN) IV  Allergies:    Allergies  Allergen Reactions  . Penicillins Rash    Social History:   Social History   Socioeconomic History  . Marital status: Single    Spouse name: Not on file  . Number of children: 1  . Years of education: Not on file  . Highest education level: Not on file  Occupational History  . Occupation: IT sales professional: [employer]  . Financial resource strain: Not on file  . Food insecurity:    Worry: Not on file    Inability: Not on file  . Transportation needs:    Medical: Not on file    Non-medical: Not on file  Tobacco Use  . Smoking status: Former Smoker    Types: Cigarettes  . Smokeless tobacco: Never Used  . Tobacco comment: Quit x 15-20 years  Substance and Sexual Activity  . Alcohol use: Yes    Comment: occ  . Drug use: No  . Sexual activity: Not on file  Lifestyle  . Physical activity:    Days per week: Not on file    Minutes per session: Not on file  . Stress: Not on file  Relationships  . Social connections:    Talks on phone: Not on file    Gets together: Not on file    Attends religious service: Not on file    Active member of club or organization: Not on file    Attends meetings of clubs or organizations: Not on file    Relationship status: Not on file  . Intimate partner violence:    Fear of current or ex partner: Not on file    Emotionally abused: Not on file    Physically abused: Not on file    Forced sexual activity: Not on file  Other Topics Concern  . Not on file  Social History Narrative  . Not on file     Family History:    Family History  Problem Relation Age of Onset  . Colon cancer Neg Hx   . Inflammatory  bowel disease Neg Hx       Review of Systems    General:  No night sweats or weight changes. Positive for fever and chills.  Cardiovascular:  No dyspnea on exertion, edema, orthopnea, palpitations, paroxysmal nocturnal dyspnea. Positive for chest pain.  Dermatological: No rash, lesions/masses Respiratory: Positive for productive cough, no dyspnea Urologic: No hematuria, dysuria Abdominal:   No nausea, vomiting, diarrhea, bright  red blood per rectum, melena, or hematemesis Neurologic:  No visual changes, wkns, changes in mental status.  All other systems reviewed and are otherwise negative except as noted above.  Physical Exam/Data    Vitals:   10/18/17 2358 10/19/17 0104 10/19/17 0136 10/19/17 0607  BP:  (!) 86/50 (!) 88/68 113/84  Pulse:  72 79 76  Resp:  15 16 19   Temp:   98 F (36.7 C) 98.2 F (36.8 C)  TempSrc:   Oral Oral  SpO2: 100% 98% 100% 100%  Weight:   44 kg   Height:        Intake/Output Summary (Last 24 hours) at 10/19/2017 0909 Last data filed at 10/19/2017 0700 Gross per 24 hour  Intake 0 ml  Output -  Net 0 ml   Filed Weights   10/18/17 1547 10/19/17 0136  Weight: 44 kg 44 kg   Body mass index is 17.74 kg/m.   General: Pleasant, thin African American female appearing in NAD. Psych: Normal affect. Neuro: Alert and oriented X 3. Moves all extremities spontaneously. HEENT: Normal  Neck: Supple without bruits or JVD. Lungs:  Resp regular and unlabored, CTA without wheezing or rales. On 2L [place].  Heart: RRR no s3, s4, or murmurs. Abdomen: Soft, non-tender, non-distended, BS + x 4.  Extremities: No clubbing, cyanosis or edema. DP/PT/Radials 2+ and equal bilaterally.   EKG:  The EKG was personally reviewed and demonstrates: NSR, HR 77, with slight upsloping of the ST segment along the inferior and lateral leads which is noted on prior tracings.  Telemetry:  Telemetry was personally reviewed and demonstrates: Normal sinus rhythm, heart rate in the 60's  to 70's.   Labs/Studies     Relevant CV Studies:  Echocardiogram: Pending  Laboratory Data:  Chemistry Recent Labs  Lab 10/18/17 1640 10/19/17 0725  NA 138 138  K 4.5 4.2  CL 103 106  CO2 29 26  GLUCOSE 99 154*  BUN 20 17  CREATININE 1.07* 0.95  CALCIUM 9.5 8.9  GFRNONAA 55* >60  GFRAA >60 >60  ANIONGAP 6 6    Recent Labs  Lab 10/19/17 0725  PROT 7.4  ALBUMIN 3.1*  AST 21  ALT 12  ALKPHOS 76  BILITOT 0.6   Hematology Recent Labs  Lab 10/18/17 1640 10/19/17 0725  WBC 5.5 2.4*  RBC 3.89 3.54*  HGB 12.2 11.0*  HCT 37.1 33.2*  MCV 95.4 93.8  MCH 31.4 31.1  MCHC 32.9 33.1  RDW 13.3 13.1  PLT 278 268   Cardiac Enzymes Recent Labs  Lab 10/18/17 1640 10/18/17 2005 10/19/17 0152 10/19/17 0725  TROPONINI 0.05* 0.05* 0.04* 0.04*   No results for input(s): TROPIPOC in the last 168 hours.  BNP Recent Labs  Lab 10/18/17 1640  BNP 49.0    DDimer No results for input(s): DDIMER in the last 168 hours.  Radiology/Studies:  Dg Chest 2 View  Result Date: 10/18/2017 CLINICAL DATA:  Chest pain for 1 week EXAM: CHEST - 2 VIEW COMPARISON:  06/17/2013 FINDINGS: There is mild lingular scarring. There is no focal parenchymal opacity. There is no pleural effusion or pneumothorax. The heart and mediastinal contours are unremarkable. The osseous structures are unremarkable. IMPRESSION: No active cardiopulmonary disease. Electronically Signed   By: Kathreen Devoid   On: 10/18/2017 16:19   Ct Angio Chest Pe W And/or Wo Contrast  Result Date: 10/18/2017 CLINICAL DATA:  Chest pain and cough EXAM: CT ANGIOGRAPHY CHEST WITH CONTRAST TECHNIQUE: Multidetector CT imaging of the  chest was performed using the standard protocol during bolus administration of intravenous contrast. Multiplanar CT image reconstructions and MIPs were obtained to evaluate the vascular anatomy. CONTRAST:  71m ISOVUE-370 IOPAMIDOL (ISOVUE-370) INJECTION 76% COMPARISON:  Chest CT February 11, 2009; chest  radiograph October 18, 2017 FINDINGS: Cardiovascular: There is no demonstrable pulmonary embolus. There is no thoracic aortic aneurysm or dissection. The visualized great vessels appear unremarkable. There are foci aortic atherosclerosis. No pericardial effusion or pericardial thickening is evident. The main pulmonary outflow tract measures 3.8 cm which is prominent. Mediastinum/Nodes: Thyroid appears unremarkable. There is no appreciable thoracic adenopathy. No esophageal lesions are evident. Lungs/Pleura: There is underlying centrilobular emphysematous change. There is mild scarring and atelectatic change in the bases. There is no edema or consolidation. There is no evident pleural effusion or pleural thickening. There is slight lower lobe bronchiectatic change. Upper Abdomen: Visualized upper abdominal structures appear unremarkable. Musculoskeletal: There are no blastic or lytic bone lesions. No chest wall lesions are evident. Review of the MIP images confirms the above findings. IMPRESSION: 1. No demonstrable pulmonary embolus. No thoracic aortic aneurysm or dissection. There is thoracic aortic atherosclerosis. 2. There is prominence of the main pulmonary outflow tract indicative of a degree of pulmonary arterial hypertension. 3. Centrilobular emphysematous change. No edema or consolidation. Mild scarring and atelectasis in the lung bases. There is also mild lower lobe bronchiectatic change. 4.  No evident thoracic adenopathy. Aortic Atherosclerosis (ICD10-I70.0) and Emphysema (ICD10-J43.9). Electronically Signed   By: WLowella GripIII M.D.   On: 10/18/2017 20:43     Assessment & Plan    1. Pleuritic Chest Pain - she presents with episodes of a sharp, sternal pain which has been occurring mostly with coughing but also at night when lying down to go to sleep and lasts for several minutes then spontaneously resolves. Reports associated subjective fever, chills, and a productive cough over the past  week since being around her grandchildren who were sick.  - Initial and cyclic troponin values have been flat at 0.05, 0.05, and 0.04. Sed rate elevated to 62.  CRP is pending. EKG shows NSR, HR 77, with upsloping of the ST segment along the inferior and lateral leads which is noted on prior tracings and most consistent with repol abnormalities. No evidence of a pericardial effusion by CT Imaging. Echocardiogram is pending to assess for structural abnormalities and to rule-out an effusion. Overall, her presentation seems most consistent with pleuritic pain. Can treat with a short-course of anti-inflammatories. No indication for Colchicine unless effusion noted by echo imaging.   2. HTN - BP has been variable at 86/50 - 113/84 since admission. Would hold PTA Bystolic at this time.    3. Hypoxia - oxygen saturations initially in the 80's, improved to 98% on 2L [place]. CXR and CTA with no acute findings. Continue to wean to Room Air.    For questions or updates, please contact CHalsteadPlease consult [email or web address] for contact info under Cardiology/STEMI.  Signed, BErma Heritage PA-C 10/19/2017, 9:09 AM Pager: [phone]  Attending note:  Patient seen and examined.  I reviewed her records and discussed the case with Ms. SAhmed PrimaPA-C.  Grace Cross to the hospital reporting recent sharp, pleuritic chest discomfort in her lower sternal region in the setting of cough and general malaise.  This is been going on for at least a week.  She does report being around some "sick" grandchildren recently.  She was admitted for further work-up, chest CTA showing  no evidence of pulmonary embolus with incidentally noted thoracic aortic atherosclerosis, no infiltrates, question of pulmonary arterial hypertension.  She had no pericardial effusion or thickening.  ESR is elevated at 62 but she is afebrile.  ECG does not show typical pattern of pericarditis or acute injury.  On examination this morning  she appears comfortable, reports no active symptoms.  She is afebrile, heart rate in the 70s in sinus rhythm by telemetry which I personally reviewed.  Systolic blood pressure did dip down into the 70s to 80s overnight, but is now back to the 100-120 range.  Lungs are clear without labored breathing.  No obvious pleural or pericardial rub noted.  Cardiac exam with RRR and no gallop.  She has no peripheral edema.  Lab work shows potassium 4.2, BUN 17, creatinine 0.95, troponin I levels 0.05 and 0.04, WBC 2.4, hemoglobin 11.0, platelets 268.  Personally reviewed her recent ECGs which shows sinus rhythm with probable early repolarization.  Patient admitted with atypical pleuritic chest discomfort.  Troponin I pattern is not consistent with ACS.  She had no pericardial effusion by chest CT with follow-up echocardiogram pending for assessment of cardiac structure and function.  At this point do not suspect pericarditis, although she could have another inflammatory process that is evolving.  Elevated ESR and progressive reduction in white blood cell count would support this.  Could be viral etiology.  She was given steroids and NSAID in the ER and reports improvement in symptoms today.  We will follow-up on the echocardiogram, but if no substantial abnormalities are noted, no further inpatient cardiac work-up will be planned.  Satira Sark, M.D., F.A.C.C.

## 2017-10-19 NOTE — Progress Notes (Signed)
*  PRELIMINARY RESULTS* Echocardiogram 2D Echocardiogram has been performed.  Jeryl Columbialliott, Kessie Croston 10/19/2017, 10:50 AM

## 2017-10-19 NOTE — Progress Notes (Signed)
NURSING PROGRESS NOTE  Grace Cross [id number] Discharge Data: 10/19/2017 2:51 PM Attending Provider: Erick BlinksMemon, Jehanzeb, MD WUJ:[id number]:Hall, Grace HazelJohn Z, MD     Grace Cross to be D/C'd Home per MD order.  Discussed with the patient the After Visit Summary and all questions fully answered. All IV's discontinued with no bleeding noted. All belongings returned to patient for patient to take home.   Last Vital Signs:  Blood pressure 113/84, pulse 76, temperature 98.2 F (36.8 C), temperature source Oral, resp. rate 19, height 5\' 2"  (1.575 m), weight 44 kg, SpO2 100 %.  Discharge Medication List Allergies as of 10/19/2017      Reactions   Penicillins Rash      Medication List    STOP taking these medications   hydroxychloroquine 200 MG tablet Commonly known as:  PLAQUENIL     TAKE these medications   ADVAIR DISKUS 250-50 MCG/DOSE Aepb Generic drug:  Fluticasone-Salmeterol Inhale 1 puff into the lungs 2 (two) times daily.   BYSTOLIC 20 MG Tabs Generic drug:  Nebivolol HCl Take 0.5 tablets by mouth daily.   Eluxadoline 75 MG Tabs Take 75 mg by mouth 2 (two) times daily.   escitalopram 10 MG tablet Commonly known as:  LEXAPRO Take 10 mg by mouth every morning.   ibuprofen 800 MG tablet Commonly known as:  ADVIL,MOTRIN Take 800 mg by mouth as needed.   omeprazole 20 MG capsule Commonly known as:  PRILOSEC TAKE ONE CAPSULE BY MOUTH TWICE DAILY BEFORE A MEAL   predniSONE 10 MG tablet Commonly known as:  DELTASONE Take 40mg  po daily for 2 days then 30mg  daily for 2 days then 20mg  daily for 2 days then 10mg  daily for 2 days then stop   PROAIR HFA 108 (90 Base) MCG/ACT inhaler Generic drug:  albuterol Inhale 2 puffs into the lungs every 6 (six) hours as needed. For shortness of breath   traMADol 50 MG tablet Commonly known as:  ULTRAM Take 50 mg by mouth 3 times/day as needed-between meals & bedtime.        Roma KayserStephanie Rain Friedt, RN

## 2017-10-19 NOTE — ED Notes (Signed)
[facility] [place] contacted about patient being hypotensive. Dr. Sharl MaLama advised normal saline at 125/hr.

## 2017-10-20 LAB — HIV ANTIBODY (ROUTINE TESTING W REFLEX): HIV Screen 4th Generation wRfx: NONREACTIVE

## 2017-10-22 LAB — ANTINUCLEAR ANTIBODIES, IFA: ANTINUCLEAR ANTIBODIES, IFA: NEGATIVE

## 2017-10-23 ENCOUNTER — Other Ambulatory Visit: Payer: Self-pay | Admitting: Nurse Practitioner

## 2017-11-13 ENCOUNTER — Other Ambulatory Visit (HOSPITAL_COMMUNITY): Payer: Self-pay | Admitting: Respiratory Therapy

## 2017-11-13 DIAGNOSIS — R0602 Shortness of breath: Secondary | ICD-10-CM

## 2017-12-05 ENCOUNTER — Ambulatory Visit (HOSPITAL_COMMUNITY)
Admission: RE | Admit: 2017-12-05 | Discharge: 2017-12-05 | Disposition: A | Discharge status: Home / Self Care | Payer: BLUE CROSS/BLUE SHIELD | Source: Ambulatory Visit | Attending: Internal Medicine | Admitting: Internal Medicine

## 2017-12-05 DIAGNOSIS — R0602 Shortness of breath: Secondary | ICD-10-CM | POA: Diagnosis not present

## 2017-12-05 LAB — PULMONARY FUNCTION TEST
DL/VA % pred: 50 %
DL/VA: 2.24 ml/min/mmHg/L
DLCO unc % pred: 37 %
DLCO unc: 7.6 ml/min/mmHg
FEF 25-75 Post: 0.46 L/sec
FEF 25-75 Pre: 0.33 L/sec
FEF2575-%CHANGE-POST: 41 %
FEF2575-%PRED-POST: 25 %
FEF2575-%Pred-Pre: 18 %
FEV1-%CHANGE-POST: 16 %
FEV1-%PRED-PRE: 46 %
FEV1-%Pred-Post: 53 %
FEV1-POST: 0.95 L
FEV1-Pre: 0.82 L
FEV1FVC-%CHANGE-POST: 6 %
FEV1FVC-%Pred-Pre: 53 %
FEV6-%Change-Post: 10 %
FEV6-%PRED-POST: 89 %
FEV6-%PRED-PRE: 81 %
FEV6-PRE: 1.78 L
FEV6-Post: 1.96 L
FEV6FVC-%CHANGE-POST: 1 %
FEV6FVC-%PRED-PRE: 94 %
FEV6FVC-%Pred-Post: 95 %
FVC-%CHANGE-POST: 8 %
FVC-%PRED-POST: 93 %
FVC-%Pred-Pre: 85 %
FVC-Post: 2.13 L
FVC-Pre: 1.96 L
POST FEV1/FVC RATIO: 45 %
PRE FEV6/FVC RATIO: 91 %
Post FEV6/FVC ratio: 92 %
Pre FEV1/FVC ratio: 42 %
RV % PRED: 134 %
RV: 2.52 L
TLC % pred: 96 %
TLC: 4.45 L

## 2017-12-05 MED ORDER — ALBUTEROL SULFATE (2.5 MG/3ML) 0.083% IN NEBU
2.5000 mg | INHALATION_SOLUTION | Freq: Once | RESPIRATORY_TRACT | Status: AC
  Administered 2017-12-05: 2.5 mg via RESPIRATORY_TRACT

## 2018-11-20 ENCOUNTER — Other Ambulatory Visit: Payer: Self-pay | Admitting: Nurse Practitioner

## 2019-10-14 ENCOUNTER — Ambulatory Visit: Payer: Self-pay | Admitting: Physician Assistant

## 2019-10-14 ENCOUNTER — Other Ambulatory Visit: Payer: Self-pay

## 2019-10-14 ENCOUNTER — Encounter: Payer: Self-pay | Admitting: Physician Assistant

## 2019-10-14 VITALS — BP 140/104 | HR 109 | Temp 99.0°F | Ht 61.75 in | Wt 104.0 lb

## 2019-10-14 DIAGNOSIS — Z862 Personal history of diseases of the blood and blood-forming organs and certain disorders involving the immune mechanism: Secondary | ICD-10-CM

## 2019-10-14 DIAGNOSIS — R7309 Other abnormal glucose: Secondary | ICD-10-CM

## 2019-10-14 DIAGNOSIS — Z131 Encounter for screening for diabetes mellitus: Secondary | ICD-10-CM

## 2019-10-14 DIAGNOSIS — Z7689 Persons encountering health services in other specified circumstances: Secondary | ICD-10-CM

## 2019-10-14 DIAGNOSIS — Z13220 Encounter for screening for lipoid disorders: Secondary | ICD-10-CM

## 2019-10-14 DIAGNOSIS — Z8639 Personal history of other endocrine, nutritional and metabolic disease: Secondary | ICD-10-CM

## 2019-10-14 DIAGNOSIS — J45909 Unspecified asthma, uncomplicated: Secondary | ICD-10-CM

## 2019-10-14 MED ORDER — ALBUTEROL SULFATE HFA 108 (90 BASE) MCG/ACT IN AERS: 2.0000 | INHALATION_SPRAY | Freq: Four times a day (QID) | RESPIRATORY_TRACT | 0 refills | Status: DC | PRN

## 2019-10-14 MED ORDER — CITALOPRAM HYDROBROMIDE 20 MG PO TABS: 20.0000 mg | ORAL_TABLET | Freq: Every day | ORAL | 1 refills | Status: DC

## 2019-10-14 MED ORDER — OMEPRAZOLE 40 MG PO CPDR: 40.0000 mg | DELAYED_RELEASE_CAPSULE | Freq: Every day | ORAL | 3 refills | Status: DC

## 2019-10-14 MED ORDER — QVAR REDIHALER 40 MCG/ACT IN AERB: 2.0000 | INHALATION_SPRAY | Freq: Two times a day (BID) | RESPIRATORY_TRACT | 0 refills | Status: DC

## 2019-10-14 NOTE — Congregational Nurse Program (Unsigned)
  Dept: [phone]   Congregational Nurse Program Note  Date of Encounter: 10/14/2019  Past Medical History: Past Medical History:  Diagnosis Date  . Acid reflux   . Asthma   . Cardiac arrhythmia   . Hypertension     Encounter Details:  CNP Questionnaire - 10/14/19 1233      Questionnaire   Race Black or African American    Location Patient Served At [employer] Not Applicable    Uninsured Uninsured (NEW 1x/quarter)    Food Within past 12 months, worried food would run out with no money to buy more;Within past 12 months, food ran out with no money to buy more    Housing/Utilities Yes, have permanent housing    Transportation No transportation needs    Interpersonal Safety Yes, feel physically and emotionally safe where you currently live    Medication Yes, have medication insecurities    Medical Provider No    Referrals Primary Care Provider/Clinic;Medication Assistance    ED Visit Averted Yes    Life-Saving Intervention Made Not Applicable           Client into [facility] today to enroll in [facility]. She has not seen a provider in over a year and that was Dr. Dwana Melena. She was working at [employer] and had insurance with [employer], confirmed with [employer] client has not had insurance since 2019. Client currently lives with her brother.   Reports she is fully vaccinated for COVID 19  PMH: Per client Anemia Anxiety Asthma Insomnia Depression PHQ9 today score 8 (10/14/19) Heart Murmur Hypertension Thyroid disease  No surgical history per client  Client has had no medications for blood pressure in a year According to Epic Was taking  Bystolic 20 mg Advair 250-50 Citracil Omeprazole Escitalopram 20 mg Albuterol MDI  Dicyclomine 20 mg Eluxadoline 75 mg  Allergy: Penicillin Hives, Rash  Alert and oriented to person place and time, answers questions appropriately. Client reports she wears her mask in public. Quit smoking years ago. Denies  Chest pains or shortness of breath today.  Reports frontal headaches daily and has noticed those more since not being on her blood pressure medications. Denies difficulty with speech, face symmetrical, tongue midline. No loss of strength noted in upper or lower extremities. 4+ bilateral grips. Gait normal.    Blood pressure today Left arm sitting small cuff 177/111 Pulse 111 Temp 98.1 orally O2 Sat 97% Fasting finger stick glucose 90

## 2019-10-14 NOTE — Progress Notes (Signed)
BP (!) 80/60   Pulse (!) 109   Temp 99 F (37.2 C)   Ht 5' 1.75" (1.568 m)   Wt 104 lb (47.2 kg)   SpO2 95%   BMI 19.18 kg/m    Subjective:    Patient ID: Grace Cross, female    DOB: [birth year], 64 y.o.   MRN: [id number]  HPI: Grace Cross is a 64 y.o. female presenting on 10/14/2019 for New Patient (Initial Visit) (pt last seen by Dwana Melena about a year ago. pt did not f/u due to losing insurance)   HPI  Pt had a negative covid 19 screening questionnaire.    Chief Complaint  Patient presents with  . New Patient (Initial Visit)    pt last seen by Dwana Melena about a year ago. pt did not f/u due to losing insurance      She says she is on disability she says due to health issues.    She thinks her last mammogram was about 2 years ago at a van at work.   She has been out of most of her meds for close to a year.   She says her breathing hasn't been too bad and she feels fine.  Pt says he has a history of her BP going very high and then very low.   She says she was put on BP medications in the past but they were always stopped because her BP went too low.   She says she feels fine.  Pt has had 3 BP readings today: 177/111 10/104 80/60   Pt states she felt better on her ssri and wants to get that restarted.  She says it's stress.  She denies SI, HI.      Relevant past medical, surgical, family and social history reviewed and updated as indicated. Interim medical history since our last visit reviewed. Allergies and medications reviewed and updated.  CURRENT MEDS: Albuterol MDI Centrum silver Tramadol    Review of Systems  Per HPI unless specifically indicated above     Objective:    BP (!) 80/60   Pulse (!) 109   Temp 99 F (37.2 C)   Ht 5' 1.75" (1.568 m)   Wt 104 lb (47.2 kg)   SpO2 95%   BMI 19.18 kg/m   Wt Readings from Last 3 Encounters:  10/14/19 104 lb (47.2 kg)  10/14/19 102 lb 9.6 oz (46.5 kg)  10/19/17 97 lb (44 kg)     Vitals  with BMI 10/14/2019 10/14/2019 10/14/2019  Height - 5' 1.75" 5\' 2"   Weight - 104 lbs 102 lbs 10 oz  BMI - 19.19 18.76  Systolic 140 80 177  Diastolic 104 60 111  Pulse - 109 111     Physical Exam Vitals reviewed.  Constitutional:      General: She is not in acute distress.    Appearance: She is well-developed. She is not ill-appearing.  HENT:     Head: Normocephalic and atraumatic.  Eyes:     Conjunctiva/sclera: Conjunctivae normal.     Pupils: Pupils are equal, round, and reactive to light.  Neck:     Thyroid: No thyromegaly.  Cardiovascular:     Rate and Rhythm: Normal rate and regular rhythm.  Pulmonary:     Effort: Pulmonary effort is normal.     Breath sounds: Normal breath sounds.  Abdominal:     General: Bowel sounds are normal.     Palpations: Abdomen is soft. There is  no mass.     Tenderness: There is no abdominal tenderness.  Musculoskeletal:     Cervical back: Neck supple.     Right lower leg: No edema.     Left lower leg: No edema.  Lymphadenopathy:     Cervical: No cervical adenopathy.  Skin:    General: Skin is warm and dry.  Neurological:     Mental Status: She is alert and oriented to person, place, and time.     Motor: No weakness or tremor.     Gait: Gait normal.  Psychiatric:        Attention and Perception: Attention normal.        Speech: Speech normal.        Behavior: Behavior normal. Behavior is cooperative.     Comments: Very pleasant.  Appropriate.             Assessment & Plan:     Encounter Diagnoses  Name Primary?  . Encounter to establish care Yes  . Labile blood glucose   . Uncomplicated asthma, unspecified asthma severity, unspecified whether persistent   . Screening cholesterol level   . History of hyperthyroidism   . History of anemia   . Screening for diabetes mellitus      -Refer for htn monitoring program.  This will be the best way to see what her bp is doing and get a lot of readings -will Update labs -refer  for screening Mammogram -pt will be signed up for [facility]  Citalopram, proair, omeprazole, advair -she is given sample qvar to use until her advair comes from [facility] -Records request sent to rhematologist (one of Dr Darrick Penna' notes states pt has sjorgrens but pt says she doesn't know what that is) -pt will follow up in 1 month to review labs, records, BP readings.  She is to contact office sooner prn

## 2019-10-15 MED ORDER — CITALOPRAM HYDROBROMIDE 20 MG PO TABS: 20.0000 mg | ORAL_TABLET | Freq: Every day | ORAL | 0 refills | Status: DC

## 2019-10-15 MED ORDER — OMEPRAZOLE 40 MG PO CPDR: 40.0000 mg | DELAYED_RELEASE_CAPSULE | Freq: Every day | ORAL | 0 refills | Status: DC

## 2019-10-15 MED ORDER — ALBUTEROL SULFATE HFA 108 (90 BASE) MCG/ACT IN AERS: 2.0000 | INHALATION_SPRAY | Freq: Four times a day (QID) | RESPIRATORY_TRACT | 0 refills | Status: DC | PRN

## 2019-10-15 MED ORDER — FLUTICASONE-SALMETEROL 250-50 MCG/DOSE IN AEPB: 1.0000 | INHALATION_SPRAY | Freq: Two times a day (BID) | RESPIRATORY_TRACT | 0 refills | Status: DC

## 2019-10-22 ENCOUNTER — Telehealth: Payer: Self-pay

## 2019-10-22 NOTE — Telephone Encounter (Addendum)
Pt was contacted for enrollment into the Remote Monitoring in the [facility] Hypertension Program per a referral sent by the medical provider at [facility] to assist with BP management and health coaching   Pt was available to talk about program and enrollment requirements.  Pt agreed to move forth with the enrollment process.  Enrollment appt was made for Tuesday, Sept 22, 2021 at 1pm [facility] [address], [place]    Reminder regarding appt date and time will be sent out on 9.23.21 by text for patient and daughter as requested

## 2019-10-28 NOTE — Congregational Nurse Program (Signed)
Patient presented to [facility] Program on TODAY to initiate enrollment in the Remote Monitoring in the [facility]Hy[facility] Program and states her readiness to participate.     Pt successfully onboarded with initial BP and scale test readings that connected  to the [employer] App and provider dashboard successfully.   Initial recorded [place] BP Test reading  was 92/55 (p.m. reading  and weight was 109 clothed  and no shoes (on  [employer] Scale) observed in clinic.    Pt was provided with Remote Monitoriing in the [facility] patient facing booklet that reviewed all program requirements, guidelines, dash diet brochure/guidelines, proper techinque for monitoring and troubleshooting equipment.    -Reviewed and verified patients medication list in Epic along with patient self reporting  currently not being on any BP meds and that her provider is managing her closely due to fluctuating BP numbers and attempting to stabilize at this time and to see if she will have to be placed on any BP meds in the future    - Pt understood guidelines and expectations of program,  -Pt understood and demonstrated proper use of BP and Scale equipment by way of teachbacks after it was demonstrated to pt - Pt was provided BP Basics Manual with a coordinating BP-Personal Action Plan that was completed on site and placed in chart --Pt identified and created personal action plan goals successully          Personal Action Plan:        -Pt will check BP once daily 6 days a week (alternating with a.m. checks and p.m. checks) prior to taking morning or evening medications -Pt will monitor weight once (1) daily without clothing and using the bathroom prior to weighing. -Pt wants to committing to reducing salt intake by using "Pinch" vs multiple shakes with the salt shaker -Pt will attempt to snack on vegetables and fruits vs chips and candy; eat more oatmeal and or cold cereals with a whole  grain listed as first ingredient, eating chicken or [place], skinless white meat, and make better choices with avoiding transfat foods such as choosing olive, canola or peanut oil - Pt will commit to managing stress more by "Learning to say no" and taking antidepressant medications as prescribed -Pt will continue to f/u with provider as recommended and watch for abnormal symptoms if  bothersome - -Next Day/Check in F/U will be conducted on 10/29/19   -1st Weekly F/U update will be made no later than 11/05/19 /to check in regarding questions related to use of  the equipment and updates regarding management of BP   Will coordinate with RN Case Manager to review  BP readings daily and provide 7 day average and trends weekly to be available to communicate with provider and for their review

## 2019-10-30 ENCOUNTER — Telehealth: Payer: Self-pay

## 2019-10-30 NOTE — Telephone Encounter (Signed)
F/U was completed with pt by phone to address any questions or technical issues that Grace Cross may have had since her first day of enrollment for the remote monitoring hypertension program  Pt's daughter assisted her to make sure she was doing everything correctly while on the phone.  All BP and weight readings came through successfully.\  Pt/daughter had no other concerns and felt confident about all program protocol and use of equipment  Pt was advised she will be contacted again for her weekly update visit during the week of 11/03/19

## 2019-11-06 ENCOUNTER — Telehealth: Payer: Self-pay

## 2019-11-06 NOTE — Telephone Encounter (Signed)
F/U was completed with pt by phone to obtain weekly update related to remote monitoring of hypertension.  Pt denies any issues with performing her daily BP and weight checks.   and weight readings came through successfully    Pt/daughter had no other concerns and felt confident about all program protocol and use of equipment   No barriers identifieD  Successes: Consistent monitorig and use of BP and weight equipment.   Monitors use salt/sodium use, exercising consistent, manages stress  Pt was advised she will be contacted again for her weekly update visit during the week of 11/03/19

## 2019-11-10 NOTE — Congregational Nurse Program (Signed)
Derenda Mis remote patient monitoring for hypertension in collaboration with [facility]. Client was onboarded for program by W. Jaci Lazier LPN 9/38/18.    Last 7 day averages of recorded blood pressure readings to the [facility] dashboard are as follows.  Last 7 day average of recorded blood pressure readings: 99/69 Last 7 day recorded highest blood pressure reading: 123/94 Last 7 day recorded lowest blood pressure readings: 77/49  Plan:  RN will continue to monitor daily blood pressure readings during office hours per program guidelines and assess any escalations or trends requiring interventions and or notification to Primary care provider at [facility].  *Note client has upcoming [facility] appointment on 11[facility]13/19, will provide the provider with copy of blood pressure log to date.*  LPN will continue to provide support and health coaching per program guidelines.  RN and LPN will continue to meet and discuss client's progress weekly and identify and address any potential barriers to meeting goals.  Francee Nodal RN [facility]/ [facility].

## 2019-11-11 ENCOUNTER — Other Ambulatory Visit: Payer: Self-pay

## 2019-11-11 ENCOUNTER — Other Ambulatory Visit (HOSPITAL_COMMUNITY)
Admission: RE | Admit: 2019-11-11 | Discharge: 2019-11-11 | Disposition: A | Discharge status: Home / Self Care | Payer: BLUE CROSS/BLUE SHIELD | Source: Ambulatory Visit | Attending: Physician Assistant | Admitting: Physician Assistant

## 2019-11-11 DIAGNOSIS — Z131 Encounter for screening for diabetes mellitus: Secondary | ICD-10-CM

## 2019-11-11 DIAGNOSIS — Z8639 Personal history of other endocrine, nutritional and metabolic disease: Secondary | ICD-10-CM | POA: Insufficient documentation

## 2019-11-11 DIAGNOSIS — Z13220 Encounter for screening for lipoid disorders: Secondary | ICD-10-CM

## 2019-11-11 DIAGNOSIS — Z862 Personal history of diseases of the blood and blood-forming organs and certain disorders involving the immune mechanism: Secondary | ICD-10-CM

## 2019-11-11 LAB — COMPREHENSIVE METABOLIC PANEL
ALT: 24 U/L (ref 0–44)
AST: 29 U/L (ref 15–41)
Albumin: 3.9 g/dL (ref 3.5–5.0)
Alkaline Phosphatase: 89 U/L (ref 38–126)
Anion gap: 9 (ref 5–15)
BUN: 22 mg/dL (ref 8–23)
CO2: 28 mmol/L (ref 22–32)
Calcium: 9.9 mg/dL (ref 8.9–10.3)
Chloride: 100 mmol/L (ref 98–111)
Creatinine, Ser: 1.35 mg/dL — ABNORMAL HIGH (ref 0.44–1.00)
GFR, Estimated: 41 mL/min — ABNORMAL LOW (ref >60)
Glucose, Bld: 96 mg/dL (ref 70–99)
Potassium: 3.8 mmol/L (ref 3.5–5.1)
Sodium: 137 mmol/L (ref 135–145)
Total Bilirubin: 0.7 mg/dL (ref 0.3–1.2)
Total Protein: 8.7 g/dL — ABNORMAL HIGH (ref 6.5–8.1)

## 2019-11-11 LAB — LIPID PANEL
Cholesterol: 158 mg/dL (ref 0–200)
HDL: 66 mg/dL (ref >40)
LDL Cholesterol: 78 mg/dL (ref 0–99)
Total CHOL/HDL Ratio: 2.4 RATIO
Triglycerides: 72 mg/dL (ref <150)
VLDL: 14 mg/dL (ref 0–40)

## 2019-11-11 LAB — CBC
HCT: 39 % (ref 36.0–46.0)
Hemoglobin: 13 g/dL (ref 12.0–15.0)
MCH: 31.4 pg (ref 26.0–34.0)
MCHC: 33.3 g/dL (ref 30.0–36.0)
MCV: 94.2 fL (ref 80.0–100.0)
Platelets: 286 10*3/uL (ref 150–400)
RBC: 4.14 MIL/uL (ref 3.87–5.11)
RDW: 12.8 % (ref 11.5–15.5)
WBC: 4.1 10*3/uL (ref 4.0–10.5)
nRBC: 0 % (ref 0.0–0.2)

## 2019-11-11 LAB — HEMOGLOBIN A1C
Hgb A1c MFr Bld: 5.2 % (ref 4.8–5.6)
Mean Plasma Glucose: 102.54 mg/dL

## 2019-11-11 LAB — TSH: TSH: 2.491 u[IU]/mL (ref 0.350–4.500)

## 2019-11-13 ENCOUNTER — Ambulatory Visit: Payer: Self-pay | Admitting: Physician Assistant

## 2019-11-13 ENCOUNTER — Encounter: Payer: Self-pay | Admitting: Physician Assistant

## 2019-11-13 VITALS — BP 136/100 | HR 102 | Temp 98.1°F | Ht 61.75 in | Wt 100.5 lb

## 2019-11-13 DIAGNOSIS — R7989 Other specified abnormal findings of blood chemistry: Secondary | ICD-10-CM

## 2019-11-13 DIAGNOSIS — R0989 Other specified symptoms and signs involving the circulatory and respiratory systems: Secondary | ICD-10-CM

## 2019-11-13 DIAGNOSIS — J45909 Unspecified asthma, uncomplicated: Secondary | ICD-10-CM

## 2019-11-13 NOTE — Progress Notes (Signed)
BP (!) 136/100    Pulse (!) 102    Temp 98.1 F (36.7 C)    Ht 5' 1.75" (1.568 m)    Wt 100 lb 8 oz (45.6 kg)    SpO2 93%    BMI 18.53 kg/m    Subjective:    Patient ID: Grace Cross, female    DOB: 05-19-1955, 64 y.o.   MRN: [id number]  HPI: HENNESSY BARTEL is a 64 y.o. female presenting on 11/13/2019 for Follow-up   HPI    Pt had a negative covid 19 screening questionnaire.   Pt is here for routine follow up and to review labs.  She says she is feeling well today.    She is participating with remote monitoring for HTN program.  She has labile blood pressure.    She says her mood is good.      Relevant past medical, surgical, family and social history reviewed and updated as indicated. Interim medical history since our last visit reviewed. Allergies and medications reviewed and updated.   Current Outpatient Medications:    albuterol (PROAIR HFA) 108 (90 Base) MCG/ACT inhaler, Inhale 2 puffs into the lungs every 6 (six) hours as needed. For shortness of breath, Disp: 3 each, Rfl: 0   beclomethasone (QVAR REDIHALER) 40 MCG/ACT inhaler, Inhale 2 puffs into the lungs 2 (two) times daily., Disp: 1 each, Rfl: 0   citalopram (CELEXA) 20 MG tablet, Take 1 tablet (20 mg total) by mouth daily., Disp: 90 tablet, Rfl: 0   Multiple Vitamins-Minerals (CENTRUM SILVER PO), Take by mouth., Disp: , Rfl:    omeprazole (PRILOSEC) 40 MG capsule, Take 1 capsule (40 mg total) by mouth daily., Disp: 90 capsule, Rfl: 0   Fluticasone-Salmeterol (ADVAIR DISKUS) 250-50 MCG/DOSE AEPB, Inhale 1 puff into the lungs 2 (two) times daily. (Patient not taking: Reported on 11/13/2019), Disp: 3 each, Rfl: 0   traMADol (ULTRAM) 50 MG tablet, Take 50 mg by mouth daily.  (Patient not taking: Reported on 11/13/2019), Disp: , Rfl:      Review of Systems  Per HPI unless specifically indicated above     Objective:    BP (!) 136/100    Pulse (!) 102    Temp 98.1 F (36.7 C)    Ht 5' 1.75" (1.568 m)     Wt 100 lb 8 oz (45.6 kg)    SpO2 93%    BMI 18.53 kg/m   Wt Readings from Last 3 Encounters:  11/13/19 100 lb 8 oz (45.6 kg)  10/28/19 109 lb (49.4 kg)  10/14/19 104 lb (47.2 kg)    Physical Exam Vitals reviewed.  Constitutional:      General: She is not in acute distress.    Appearance: She is well-developed. She is not ill-appearing.  HENT:     Head: Normocephalic and atraumatic.  Cardiovascular:     Rate and Rhythm: Normal rate and regular rhythm.  Pulmonary:     Effort: Pulmonary effort is normal.     Breath sounds: Normal breath sounds.  Abdominal:     General: Bowel sounds are normal.     Palpations: Abdomen is soft. There is no mass.     Tenderness: There is no abdominal tenderness.  Musculoskeletal:     Cervical back: Neck supple.     Right lower leg: No edema.     Left lower leg: No edema.  Lymphadenopathy:     Cervical: No cervical adenopathy.  Skin:    General: Skin  is warm and dry.  Neurological:     Mental Status: She is alert and oriented to person, place, and time.  Psychiatric:        Behavior: Behavior normal.     Results for orders placed or performed during the hospital encounter of 11/11/19  Hemoglobin A1c  Result Value Ref Range   Hgb A1c MFr Bld 5.2 4.8 - 5.6 %   Mean Plasma Glucose 102.54 mg/dL  TSH  Result Value Ref Range   TSH 2.491 0.350 - 4.500 uIU/mL  CBC  Result Value Ref Range   WBC 4.1 4.0 - 10.5 K/uL   RBC 4.14 3.87 - 5.11 MIL/uL   Hemoglobin 13.0 12.0 - 15.0 g/dL   HCT 16.1 36 - 46 %   MCV 94.2 80.0 - 100.0 fL   MCH 31.4 26.0 - 34.0 pg   MCHC 33.3 30.0 - 36.0 g/dL   RDW 09.6 04.5 - 40.9 %   Platelets 286 150 - 400 K/uL   nRBC 0.0 0.0 - 0.2 %  Lipid panel  Result Value Ref Range   Cholesterol 158 0 - 200 mg/dL   Triglycerides 72 <811 mg/dL   HDL 66 >91 mg/dL   Total CHOL/HDL Ratio 2.4 RATIO   VLDL 14 0 - 40 mg/dL   LDL Cholesterol 78 0 - 99 mg/dL  Comprehensive metabolic panel  Result Value Ref Range   Sodium 137 135  - 145 mmol/L   Potassium 3.8 3.5 - 5.1 mmol/L   Chloride 100 98 - 111 mmol/L   CO2 28 22 - 32 mmol/L   Glucose, Bld 96 70 - 99 mg/dL   BUN 22 8 - 23 mg/dL   Creatinine, Ser 4.78 (H) 0.44 - 1.00 mg/dL   Calcium 9.9 8.9 - 29.5 mg/dL   Total Protein 8.7 (H) 6.5 - 8.1 g/dL   Albumin 3.9 3.5 - 5.0 g/dL   AST 29 15 - 41 U/L   ALT 24 0 - 44 U/L   Alkaline Phosphatase 89 38 - 126 U/L   Total Bilirubin 0.7 0.3 - 1.2 mg/dL   GFR, Estimated 41 (L) >60 mL/min   Anion gap 9 5 - 15      Assessment & Plan:   Encounter Diagnoses  Name Primary?   Uncomplicated asthma, unspecified asthma severity, unspecified whether persistent Yes   Labile blood pressure    Elevated serum creatinine      -Reviewed labs with pt -pt counseled to Drink enough water -will monitor renal function -refer for screening Mammogram -pt to follow up 4 mo to recheck renal fn.  She is to contact office sooner prn

## 2019-11-27 ENCOUNTER — Telehealth: Payer: Self-pay

## 2019-12-08 ENCOUNTER — Telehealth: Payer: Self-pay

## 2019-12-08 NOTE — Telephone Encounter (Signed)
Pt was f/u by phone for weekly update on today to address any challenges or successes of BP and Weight checks and Personal Action Plan.   States she continues to feel confident about the use of the equipment.  Successes -Continues to maintain physical activity by performing  exercise at least 5 out of 7 days approx 30 min a week (all within  home-squats, leg lifts, jumping jacks, stomach crunches) -Reduce eating sweets from 1-2 times a day to now in moderation (1 a week if any) -Continues to monitor BP and weight daily or as scheduled with correct use of the equipment --BP continues to remain stable and with in goal of 145/85 based on age goal for BP Continue to increase water intake (at least 3 small 8oz bottles a day) compared to previously drinking a large amount of sodas  Barriers   Doesn't really like water and have to force self to drink, but is trying  Action Pan -Continue to increase water intake to get up to  64oz -Continue to maintain current healthy eating habits (fruits, vegetables, baked foods, low to no sodium choices for foods) -Meet with health coach to receive "program incentive" water bottle that has  labels to help with increasing water intake and drinking throughout the day -Also get from health coach pill organizer to organize other meds and to help serve as a reminder

## 2019-12-10 ENCOUNTER — Other Ambulatory Visit: Payer: Self-pay | Admitting: Student

## 2019-12-10 DIAGNOSIS — Z1231 Encounter for screening mammogram for malignant neoplasm of breast: Secondary | ICD-10-CM

## 2019-12-16 ENCOUNTER — Other Ambulatory Visit: Payer: Self-pay | Admitting: Physician Assistant

## 2019-12-30 ENCOUNTER — Ambulatory Visit: Payer: Self-pay | Admitting: Physician Assistant

## 2019-12-30 ENCOUNTER — Other Ambulatory Visit: Payer: Self-pay | Admitting: Physician Assistant

## 2019-12-30 ENCOUNTER — Encounter: Payer: Self-pay | Admitting: Physician Assistant

## 2019-12-30 VITALS — BP 120/90 | HR 108 | Temp 97.0°F | Ht 61.75 in | Wt 100.3 lb

## 2019-12-30 DIAGNOSIS — R55 Syncope and collapse: Secondary | ICD-10-CM

## 2019-12-30 DIAGNOSIS — R7989 Other specified abnormal findings of blood chemistry: Secondary | ICD-10-CM

## 2019-12-30 DIAGNOSIS — I951 Orthostatic hypotension: Secondary | ICD-10-CM

## 2019-12-30 DIAGNOSIS — R0989 Other specified symptoms and signs involving the circulatory and respiratory systems: Secondary | ICD-10-CM

## 2019-12-30 DIAGNOSIS — R42 Dizziness and giddiness: Secondary | ICD-10-CM

## 2019-12-30 DIAGNOSIS — J449 Chronic obstructive pulmonary disease, unspecified: Secondary | ICD-10-CM

## 2019-12-30 MED ORDER — FLUTICASONE-SALMETEROL 55-14 MCG/ACT IN AEPB: INHALATION_SPRAY | RESPIRATORY_TRACT | 0 refills | Status: DC

## 2019-12-30 NOTE — Patient Instructions (Signed)
FOR [place] [facility]: -Social Security Notice of Award -Social Security Benefit Letter for 2021

## 2019-12-30 NOTE — Progress Notes (Signed)
BP 120/90   Pulse (!) 108   Temp (!) 97 F (36.1 C)   Ht 5' 1.75" (1.568 m)   Wt 100 lb 4.8 oz (45.5 kg)   SpO2 96%   BMI 18.49 kg/m    Subjective:    Patient ID: Grace Cross, female    DOB: [birth year], 65 y.o.   MRN: [id number]  HPI: Grace Cross is a 64 y.o. female presenting on 12/30/2019 for Fall (pt states it starts with HA then trembling then falls pt states she can hear and see still but is not able to move to get up. pt states this lasts 5-6 minutes.  pt reports episodes started about 4 years ago pt states she got treatment from a Dr in [place] saying it was due to her "nerves" pt is not too sure )   HPI    Pt had a negative covid 19 screening questionnaire.   Chief Complaint  Patient presents with  . Fall    pt states it starts with HA then trembling then falls pt states she can hear and see still but is not able to move to get up. pt states this lasts 5-6 minutes.  pt reports episodes started about 4 years ago pt states she got treatment from a Dr in [place] saying it was due to her "nerves" pt is not too sure       Pt caregiver called yesterday wanting appt for pt stating that she was having episodes of "blacking out".  Pt says she does not LOC, just feels light-headed.   She says this is not new as is in nurse note above.  Pt is not taking inhalers as prescribed because she has not yet submitted some of the forms needed for medassist.  Labs done 11/11/19 good except Cr 1.35.  Echo 10/19/17 noted    Relevant past medical, surgical, family and social history reviewed and updated as indicated. Interim medical history since our last visit reviewed. Allergies and medications reviewed and updated.   Current Outpatient Medications:  .  albuterol (PROAIR HFA) 108 (90 Base) MCG/ACT inhaler, Inhale 2 puffs into the lungs every 6 (six) hours as needed. For shortness of breath, Disp: 3 each, Rfl: 0 .  citalopram (CELEXA) 20 MG tablet, Take 1 tablet by mouth once daily,  Disp: 30 tablet, Rfl: 3 .  Multiple Vitamins-Minerals (CENTRUM SILVER PO), Take by mouth., Disp: , Rfl:  .  omeprazole (PRILOSEC) 40 MG capsule, Take 1 capsule (40 mg total) by mouth daily., Disp: 90 capsule, Rfl: 0 .  beclomethasone (QVAR REDIHALER) 40 MCG/ACT inhaler, Inhale 2 puffs into the lungs 2 (two) times daily. (Patient not taking: Reported on 12/30/2019), Disp: 1 each, Rfl: 0 .  Fluticasone-Salmeterol (ADVAIR DISKUS) 250-50 MCG/DOSE AEPB, Inhale 1 puff into the lungs 2 (two) times daily. (Patient not taking: Reported on 11/13/2019), Disp: 3 each, Rfl: 0 .  traMADol (ULTRAM) 50 MG tablet, Take 50 mg by mouth daily.  (Patient not taking: Reported on 11/13/2019), Disp: , Rfl:     Review of Systems  Per HPI unless specifically indicated above     Objective:    BP 120/90   Pulse (!) 108   Temp (!) 97 F (36.1 C)   Ht 5' 1.75" (1.568 m)   Wt 100 lb 4.8 oz (45.5 kg)   SpO2 96%   BMI 18.49 kg/m   Wt Readings from Last 3 Encounters:  12/30/19 100 lb 4.8 oz (45.5 kg)  11/13/19  100 lb 8 oz (45.6 kg)  10/28/19 109 lb (49.4 kg)    Orthostatic VS for the past 24 hrs:  BP- Lying Pulse- Lying BP- Sitting Pulse- Sitting BP- Standing at 0 minutes Pulse- Standing at 0 minutes  12/30/19 0842 (!) 166/105 103 (!) 144/102 108 99/72 107       Physical Exam Vitals reviewed.  Constitutional:      General: She is not in acute distress.    Appearance: She is well-developed. She is not toxic-appearing.     Comments: frail  HENT:     Head: Normocephalic and atraumatic.  Cardiovascular:     Rate and Rhythm: Regular rhythm. Tachycardia present.  Pulmonary:     Effort: Pulmonary effort is normal.     Breath sounds: Normal breath sounds.  Abdominal:     General: Bowel sounds are normal.     Palpations: Abdomen is soft. There is no mass.     Tenderness: There is no abdominal tenderness.  Musculoskeletal:     Cervical back: Neck supple.     Right lower leg: No edema.     Left lower  leg: No edema.  Lymphadenopathy:     Cervical: No cervical adenopathy.  Skin:    General: Skin is warm and dry.  Neurological:     Mental Status: She is alert and oriented to person, place, and time.  Psychiatric:        Behavior: Behavior normal.         EKG NSR at 97bpm with no changes compared with EKG 10/19/17      Assessment & Plan:      Encounter Diagnoses  Name Primary?  . Orthostatic hypotension Yes  . Dizziness   . Labile blood pressure   . Chronic obstructive pulmonary disease, unspecified COPD type (HCC)   . Elevated serum creatinine   . Near syncope       -discussed with pt that she appears slightly dehydrated which appears to be a chronic problem for her with mildly elevate Cr in the past and she has talked about that with someone from [facility].  Recommended that pt increase her water intake to about 3L or 6 - 20 oz bottles daily.  She repeats again that she doesn't like water and we talked about adding some of the flavorings that are available in the grocery for not a lot of money that she could use  -pt is given Sample airduo respiclick 55/14 fluticasone/salmeterol to use since she hasn't yet been approved for medassist.  She has rescue inhaler.  She is given a list of papers that she needs to provide in order to be approved for medassisty.  -will Check bmp and cbc that she is to have drawn today when she leaves office  -will Refer to cardiology for labile bp and dizziness  -she is given cafa/application for [employer] financial assistance  -pt to follow up as scheduled.  She is to contact office sooner for worsening or new symptoms

## 2020-01-07 ENCOUNTER — Telehealth: Payer: Self-pay

## 2020-01-07 NOTE — Telephone Encounter (Signed)
Pt was f/u by phone for weekly update on today to address any challenges or successes of BP and Weight checks and Personal Action Plan.   States she continues to feel good about the use of the equipment.  Successes -Continue to maintain eating healthy diet, by cutting on sweets, and maintaining a low sodium dies  -Continues to monitor BP and weight daily or as scheduled with correct use of the equipment --BP continues to remain stable and with in goal of 145/85 based on age goal for BP - Continue to increase water intake at 5-6 bottles of 16 oz -Continues to utilize pill organizer   Barriers   -Continues to have spells of passing out that  -Reduced exercise due to spells of passing out periodically   Action Pan -Continue to increase water intake to get up to  (8- 16oz bottles = 128 oz) per the recommendation of her medical  provider  -Continue to maintain current personal action that has been thus far successful

## 2020-01-15 ENCOUNTER — Other Ambulatory Visit: Payer: Self-pay

## 2020-01-15 ENCOUNTER — Ambulatory Visit (HOSPITAL_COMMUNITY)
Admission: RE | Admit: 2020-01-15 | Discharge: 2020-01-15 | Disposition: A | Discharge status: Home / Self Care | Payer: Self-pay | Source: Ambulatory Visit | Attending: Physician Assistant | Admitting: Physician Assistant

## 2020-01-15 DIAGNOSIS — Z1231 Encounter for screening mammogram for malignant neoplasm of breast: Secondary | ICD-10-CM | POA: Insufficient documentation

## 2020-02-17 ENCOUNTER — Other Ambulatory Visit: Payer: Self-pay | Admitting: Physician Assistant

## 2020-03-02 ENCOUNTER — Telehealth: Payer: Self-pay

## 2020-03-03 ENCOUNTER — Ambulatory Visit: Payer: Self-pay | Admitting: Cardiology

## 2020-03-03 ENCOUNTER — Other Ambulatory Visit: Payer: Self-pay | Admitting: Physician Assistant

## 2020-03-03 DIAGNOSIS — R7989 Other specified abnormal findings of blood chemistry: Secondary | ICD-10-CM

## 2020-03-15 ENCOUNTER — Ambulatory Visit: Payer: Self-pay | Admitting: Physician Assistant

## 2020-03-25 ENCOUNTER — Ambulatory Visit: Payer: Self-pay | Admitting: Physician Assistant

## 2020-03-25 ENCOUNTER — Encounter: Payer: Self-pay | Admitting: Physician Assistant

## 2020-03-25 ENCOUNTER — Other Ambulatory Visit: Payer: Self-pay

## 2020-03-25 VITALS — BP 126/96 | HR 74 | Temp 97.6°F | Wt 101.0 lb

## 2020-03-25 DIAGNOSIS — Z91199 Patient's noncompliance with other medical treatment and regimen due to unspecified reason: Secondary | ICD-10-CM

## 2020-03-25 DIAGNOSIS — R7989 Other specified abnormal findings of blood chemistry: Secondary | ICD-10-CM

## 2020-03-25 DIAGNOSIS — Z9119 Patient's noncompliance with other medical treatment and regimen: Secondary | ICD-10-CM

## 2020-03-25 DIAGNOSIS — J449 Chronic obstructive pulmonary disease, unspecified: Secondary | ICD-10-CM

## 2020-03-25 NOTE — Progress Notes (Signed)
BP (!) 126/96   Pulse 74   Temp 97.6 F (36.4 C)   Wt 101 lb (45.8 kg)   SpO2 94%   BMI 18.62 kg/m    Subjective:    Patient ID: Grace Cross, female    DOB: [birth year], 65 y.o.   MRN: [id number]  HPI: Grace Cross is a 65 y.o. female presenting on 03/25/2020 for No chief complaint on file.   HPI    Pt had a negative covid 19 screening questionnaire.   Pt is 64yoF who presents for follow up HTN and renal function.  She is not as much having the dizziness as she was at her previous appointment.  She says she has had No more falls.  She was referred to cardiology but did not go to cardiology appointment yet because she was sick on her appointment day  Pt is upset because she got a letter says she has to pay to see cardiology but she doesn't know if she turned in the cafa / application for [employer] financial assistance that she was given in order to cover the cost of her seeing the cardiologist.   She did not get her labs drawn.   Again.  She did not get labs drawn in November as requested either  She did not bring her medications and doesn't know what she is taking.      Relevant past medical, surgical, family and social history reviewed and updated as indicated. Interim medical history since our last visit reviewed. Allergies and medications reviewed and updated.  CURRENT MEDS: unknown  Current Outpatient Medications:  .  albuterol (PROAIR HFA) 108 (90 Base) MCG/ACT inhaler, Inhale 2 puffs into the lungs every 6 (six) hours as needed. For shortness of breath, Disp: 3 each, Rfl: 0 .  citalopram (CELEXA) 20 MG tablet, Take 1 tablet by mouth once daily, Disp: 30 tablet, Rfl: 3 .  Fluticasone-Salmeterol (ADVAIR DISKUS) 250-50 MCG/DOSE AEPB, Inhale 1 puff into the lungs 2 (two) times daily. (Patient not taking: No sig reported), Disp: 3 each, Rfl: 0 .  Fluticasone-Salmeterol 55-14 MCG/ACT AEPB, 1 puff bid, Disp: 1 each, Rfl: 0 .  Multiple Vitamins-Minerals (CENTRUM  SILVER PO), Take by mouth., Disp: , Rfl:  .  omeprazole (PRILOSEC) 40 MG capsule, Take 1 capsule by mouth once daily, Disp: 30 capsule, Rfl: 1 .  traMADol (ULTRAM) 50 MG tablet, Take 50 mg by mouth daily.  (Patient not taking: No sig reported), Disp: , Rfl:     Review of Systems  Per HPI unless specifically indicated above     Objective:    BP (!) 126/96   Pulse 74   Temp 97.6 F (36.4 C)   Wt 101 lb (45.8 kg)   SpO2 94%   BMI 18.62 kg/m   Wt Readings from Last 3 Encounters:  03/25/20 101 lb (45.8 kg)  12/30/19 100 lb 4.8 oz (45.5 kg)  11/13/19 100 lb 8 oz (45.6 kg)    Physical Exam Vitals reviewed.  Constitutional:      General: She is not in acute distress.    Appearance: She is not toxic-appearing.  HENT:     Head: Normocephalic and atraumatic.  Cardiovascular:     Rate and Rhythm: Normal rate and regular rhythm.  Pulmonary:     Effort: Pulmonary effort is normal. No respiratory distress.     Breath sounds: Normal breath sounds. No wheezing or rhonchi.  Abdominal:     General: Bowel sounds are normal.  Palpations: Abdomen is soft.     Tenderness: There is no abdominal tenderness.  Musculoskeletal:     Cervical back: Neck supple.     Right lower leg: No edema.     Left lower leg: No edema.  Lymphadenopathy:     Cervical: No cervical adenopathy.  Neurological:     Mental Status: She is alert and oriented to person, place, and time.  Psychiatric:        Attention and Perception: Attention normal.        Speech: Speech normal.        Behavior: Behavior normal.             Assessment & Plan:    Encounter Diagnoses  Name Primary?  . Chronic obstructive pulmonary disease, unspecified COPD type (HCC) Yes  . Elevated serum creatinine   . Personal history of noncompliance with medical treatment, presenting hazards to health       -pt encouraged to get Labs drawn  She will be called with results -discussed with pt the importance of bringing all  meds with her to every medical appointment in order to be better able to care for her -pt is given another cafa /  Application for [employer] financial assistance to cover her cardiology apointment -encouraged pt to get covid booster -F/u 3 months.  She is to contact office sooner prn

## 2020-04-07 ENCOUNTER — Encounter: Payer: Self-pay | Admitting: Cardiology

## 2020-04-07 NOTE — Progress Notes (Signed)
Cardiology Office Note  Date: 04/08/2020   ID: Taleeya, Blondin 25-Oct-1955, MRN [id number]  PCP:  Jacquelin Hawking, PA-C  Cardiologist:  Nona Dell, MD Electrophysiologist:  None   Chief Complaint  Patient presents with  . Orthostasis    History of Present Illness: Grace Cross is a 65 y.o. female referred for cardiology consultation by Ms. McElroy PA-C for the evaluation of dizziness and suspected orthostasis.  We discussed her symptoms today.  She states that she has intermittent episodes of lightheadedness and associated "bright" vision with feeling of weakness and the need to sit down.  This has been happening sporadically for the last 4 years at least.  She has never had frank syncope, reports no associated chest pain or palpitations.  Orthostatic vital signs from office visit in November 2021 reviewed, did have positive response in terms of blood pressure drop, but no significant heart rate change.  Orthostatic vital signs obtained today did not show any significant change in blood pressure or heart rate.  She was encouraged by PCP to increase fluid intake, has done so and reports general improvement.  She has a reported history of hypothyroidism, TSH normal recently and currently not on any specific therapy.  I reviewed her ECG from November 2021 as described below.  Past Medical History:  Diagnosis Date  . Acid reflux   . Asthma   . Depression   . Hypertension   . Hypothyroidism     Past Surgical History:  Procedure Laterality Date  . COLONOSCOPY, ESOPHAGOGASTRODUODENOSCOPY (EGD) AND ESOPHAGEAL DILATION N/A 06/17/2012   DQQ:IWLN IN CECUM/Mod IH/ESO ring/Gastritis    Current Outpatient Medications  Medication Sig Dispense Refill  . albuterol (PROAIR HFA) 108 (90 Base) MCG/ACT inhaler Inhale 2 puffs into the lungs every 6 (six) hours as needed. For shortness of breath 3 each 0  . citalopram (CELEXA) 20 MG tablet Take 1 tablet by mouth once daily 30 tablet 3   . Fluticasone-Salmeterol (ADVAIR DISKUS) 250-50 MCG/DOSE AEPB Inhale 1 puff into the lungs 2 (two) times daily. 3 each 0  . Fluticasone-Salmeterol 55-14 MCG/ACT AEPB 1 puff bid 1 each 0  . Multiple Vitamins-Minerals (CENTRUM SILVER PO) Take by mouth.    Marland Kitchen omeprazole (PRILOSEC) 40 MG capsule Take 1 capsule by mouth once daily 30 capsule 1  . traMADol (ULTRAM) 50 MG tablet Take 50 mg by mouth daily.     No current facility-administered medications for this visit.   Allergies:  Penicillins   Social History: The patient  reports that she quit smoking about 25 years ago. Her smoking use included cigarettes. She has a 11.00 pack-year smoking history. She has never used smokeless tobacco. She reports previous alcohol use. She reports that she does not use drugs.   Family History: The patient's family history includes Asthma in her brother and mother; Congestive Heart Failure in her father; Diabetes in her sister; Heart attack in her father; Hyperlipidemia in her brother; Hypertension in her brother and brother.   ROS: No palpitations or frank syncope.  Physical Exam: VS:  BP (!) 136/93   Pulse 97   Ht 5\' 1"  (1.549 m)   Wt 104 lb (47.2 kg)   BMI 19.65 kg/m , BMI Body mass index is 19.65 kg/m.  Wt Readings from Last 3 Encounters:  04/08/20 104 lb (47.2 kg)  03/25/20 101 lb (45.8 kg)  12/30/19 100 lb 4.8 oz (45.5 kg)    General: Thin woman, no distress. HEENT: Conjunctiva and lids  normal, wearing a mask. Neck: Supple, no elevated JVP or carotid bruits, no thyromegaly. Lungs: Clear to auscultation, nonlabored breathing at rest. Cardiac: Regular rate and rhythm, no S3, 2/6 systolic murmur, no pericardial rub. Abdomen: Soft, nontender, bowel sounds present. Extremities: No pitting edema, distal pulses 2+. Skin: Warm and dry. Musculoskeletal: No kyphosis. Neuropsychiatric: Alert and oriented x3, affect grossly appropriate.  ECG:  An ECG dated 12/30/2019 was personally reviewed today and  demonstrated:  Sinus rhythm with nonspecific ST changes.  Recent Labwork: 11/11/2019: ALT 24; AST 29; BUN 22; Creatinine, Ser 1.35; Hemoglobin 13.0; Platelets 286; Potassium 3.8; Sodium 137; TSH 2.491     Component Value Date/Time   CHOL 158 11/11/2019 1119   TRIG 72 11/11/2019 1119   HDL 66 11/11/2019 1119   CHOLHDL 2.4 11/11/2019 1119   VLDL 14 11/11/2019 1119   LDLCALC 78 11/11/2019 1119    Other Studies Reviewed Today:  Echocardiogram 10/19/2017: - Left ventricle: The cavity size was normal. Wall thickness was  normal. Systolic function was vigorous. The estimated ejection  fraction was in the range of 65% to 70%. Wall motion was normal;  there were no regional wall motion abnormalities. Indeterminate  diastolic function.  - Aortic valve: Mildly calcified annulus. Trileaflet.  - Mitral valve: There was trivial regurgitation.  - Right atrium: Central venous pressure (est): 3 mm Hg.  - Atrial septum: No defect or patent foramen ovale was identified.  - Tricuspid valve: There was trivial regurgitation.  - Pulmonary arteries: Systolic pressure could not be accurately  estimated.  - Pericardium, extracardiac: There was no pericardial effusion.   Assessment and Plan:  1.  Intermittent episodes of orthostatic dizziness, did have positive orthostatic drop per vital signs at PCP, however repeated today and found to be normal.  TSH and hemoglobin normal from lab work in October 2021.  ECG with normal intervals.  No associated palpitations or chest discomfort.  Agree with recommendations to increase fluid intake.  We will also obtain a follow-up echocardiogram to ensure no change in cardiac structure and function, basal systolic murmur noted on examination.  2.  Acid reflux, currently on Prilosec.  Medication Adjustments/Labs and Tests Ordered: Current medicines are reviewed at length with the patient today.  Concerns regarding medicines are outlined above.   Tests  Ordered: Orders Placed This Encounter  Procedures  . ECHOCARDIOGRAM COMPLETE    Medication Changes: No orders of the defined types were placed in this encounter.   Disposition:  Follow up test results.  Signed, Jonelle Sidle, MD, [facility] 04/08/2020 1:50 PM    [facility] [address] [place], [place] [address] Phone: [phone]; Fax: [phone]

## 2020-04-08 ENCOUNTER — Ambulatory Visit (INDEPENDENT_AMBULATORY_CARE_PROVIDER_SITE_OTHER): Payer: Medicare Other | Admitting: Cardiology

## 2020-04-08 ENCOUNTER — Other Ambulatory Visit: Payer: Self-pay

## 2020-04-08 ENCOUNTER — Encounter: Payer: Self-pay | Admitting: Cardiology

## 2020-04-08 VITALS — BP 136/93 | HR 97 | Ht 61.0 in | Wt 104.0 lb

## 2020-04-08 DIAGNOSIS — I951 Orthostatic hypotension: Secondary | ICD-10-CM | POA: Diagnosis not present

## 2020-04-08 DIAGNOSIS — R011 Cardiac murmur, unspecified: Secondary | ICD-10-CM | POA: Diagnosis not present

## 2020-04-08 NOTE — Patient Instructions (Signed)
Medication Instructions:  Your physician recommends that you continue on your current medications as directed. Please refer to the Current Medication list given to you today.  *If you need a refill on your cardiac medications before your next appointment, please call your pharmacy*   Lab Work: NONE   If you have labs (blood work) drawn today and your tests are completely normal, you will receive your results only by: Marland Kitchen MyChart Message (if you have MyChart) OR . A paper copy in the mail If you have any lab test that is abnormal or we need to change your treatment, we will call you to review the results.   Testing/Procedures: Your physician has requested that you have an echocardiogram. Echocardiography is a painless test that uses sound waves to create images of your heart. It provides your doctor with information about the size and shape of your heart and how well your heart's chambers and valves are working. This procedure takes approximately one hour. There are no restrictions for this procedure.  Follow-Up: At [facility], you and your health needs are our priority.  As part of our continuing mission to provide you with exceptional heart care, we have created designated Provider Care Teams.  These Care Teams include your primary Cardiologist (physician) and Advanced Practice Providers (APPs -  Physician Assistants and Nurse Practitioners) who all work together to provide you with the care you need, when you need it.  We recommend signing up for the patient portal called "MyChart".  Sign up information is provided on this After Visit Summary.  MyChart is used to connect with patients for Virtual Visits (Telemedicine).  Patients are able to view lab/test results, encounter notes, upcoming appointments, etc.  Non-urgent messages can be sent to your provider as well.   To learn more about what you can do with MyChart, go to [email or web address].    Your next appointment:    Pending test  results   The format for your next appointment:   In Person  Provider:   Nona Dell, MD   Other Instructions Thank you for choosing [facility]!

## 2020-04-13 ENCOUNTER — Other Ambulatory Visit: Payer: Self-pay | Admitting: Physician Assistant

## 2020-05-24 ENCOUNTER — Ambulatory Visit (INDEPENDENT_AMBULATORY_CARE_PROVIDER_SITE_OTHER): Payer: Medicare Other | Admitting: Nurse Practitioner

## 2020-05-24 ENCOUNTER — Encounter: Payer: Self-pay | Admitting: Nurse Practitioner

## 2020-05-24 ENCOUNTER — Telehealth: Payer: Self-pay

## 2020-05-24 ENCOUNTER — Other Ambulatory Visit: Payer: Self-pay

## 2020-05-24 DIAGNOSIS — F339 Major depressive disorder, recurrent, unspecified: Secondary | ICD-10-CM | POA: Insufficient documentation

## 2020-05-24 DIAGNOSIS — Z7689 Persons encountering health services in other specified circumstances: Secondary | ICD-10-CM

## 2020-05-24 DIAGNOSIS — J4540 Moderate persistent asthma, uncomplicated: Secondary | ICD-10-CM | POA: Diagnosis not present

## 2020-05-24 DIAGNOSIS — Z139 Encounter for screening, unspecified: Secondary | ICD-10-CM

## 2020-05-24 DIAGNOSIS — I10 Essential (primary) hypertension: Secondary | ICD-10-CM | POA: Diagnosis not present

## 2020-05-24 DIAGNOSIS — K219 Gastro-esophageal reflux disease without esophagitis: Secondary | ICD-10-CM

## 2020-05-24 DIAGNOSIS — R1319 Other dysphagia: Secondary | ICD-10-CM

## 2020-05-24 MED ORDER — OMEPRAZOLE 40 MG PO CPDR: 1.0000 | DELAYED_RELEASE_CAPSULE | Freq: Every day | ORAL | 0 refills | Status: DC

## 2020-05-24 MED ORDER — FLUTICASONE-SALMETEROL 250-50 MCG/DOSE IN AEPB: 1.0000 | INHALATION_SPRAY | Freq: Two times a day (BID) | RESPIRATORY_TRACT | 0 refills | Status: DC

## 2020-05-24 MED ORDER — ALBUTEROL SULFATE HFA 108 (90 BASE) MCG/ACT IN AERS: 2.0000 | INHALATION_SPRAY | Freq: Four times a day (QID) | RESPIRATORY_TRACT | 0 refills | Status: DC | PRN

## 2020-05-24 MED ORDER — CITALOPRAM HYDROBROMIDE 20 MG PO TABS: 20.0000 mg | ORAL_TABLET | Freq: Every day | ORAL | 0 refills | Status: DC

## 2020-05-24 NOTE — Telephone Encounter (Signed)
Pt daughter informed
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
## 2020-05-24 NOTE — Telephone Encounter (Signed)
Patients daughter called she states she cannot afford the inhaler that was sent in and is requesting a different one be sent in p# [phone]

## 2020-05-24 NOTE — Telephone Encounter (Signed)
Albuterol should be generic, and she has been taking advair.  She will need to call the number on her insurance card and ask what asthma maintenance medication is covered so we can send something for her.

## 2020-05-24 NOTE — Assessment & Plan Note (Signed)
-  no issues today 

## 2020-05-24 NOTE — Assessment & Plan Note (Signed)
-  refilled albuterol for PRN use -refilled advair for maintenance; she had a small fluticasone-salmeterol inhaler for PRN use, but we discussed that it would be easier to have only 1 rescue inhaler on her person, and the fluticasone-salmeterol inhaler may complicate matters if she has an asthma attack

## 2020-05-24 NOTE — Assessment & Plan Note (Signed)
Refilled omeprazole.

## 2020-05-24 NOTE — Assessment & Plan Note (Signed)
refilled citalopram

## 2020-05-24 NOTE — Assessment & Plan Note (Addendum)
-  obtain records -will get labs prior to next visit

## 2020-05-24 NOTE — Patient Instructions (Signed)
Please have fasting labs drawn 2-3 days prior to your appointment so we can discuss the results during your office visit.  
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
## 2020-06-04 LAB — LIPID PANEL WITH LDL/HDL RATIO
Cholesterol, Total: 161 mg/dL (ref 100–199)
HDL: 86 mg/dL (ref >39)
LDL Chol Calc (NIH): 60 mg/dL (ref 0–99)
LDL/HDL Ratio: 0.7 ratio (ref 0.0–3.2)
Triglycerides: 81 mg/dL (ref 0–149)
VLDL Cholesterol Cal: 15 mg/dL (ref 5–40)

## 2020-06-04 LAB — CMP14+EGFR
ALT: 32 IU/L (ref 0–32)
AST: 34 IU/L (ref 0–40)
Albumin/Globulin Ratio: 1.1 — ABNORMAL LOW (ref 1.2–2.2)
Albumin: 4.1 g/dL (ref 3.8–4.8)
Alkaline Phosphatase: 106 IU/L (ref 44–121)
BUN/Creatinine Ratio: 20 (ref 12–28)
BUN: 27 mg/dL (ref 8–27)
Bilirubin Total: 0.3 mg/dL (ref 0.0–1.2)
CO2: 22 mmol/L (ref 20–29)
Calcium: 9.3 mg/dL (ref 8.7–10.3)
Chloride: 103 mmol/L (ref 96–106)
Creatinine, Ser: 1.35 mg/dL — ABNORMAL HIGH (ref 0.57–1.00)
Globulin, Total: 3.9 g/dL (ref 1.5–4.5)
Glucose: 86 mg/dL (ref 65–99)
Potassium: 4.7 mmol/L (ref 3.5–5.2)
Sodium: 139 mmol/L (ref 134–144)
Total Protein: 8 g/dL (ref 6.0–8.5)
eGFR: 44 mL/min/{1.73_m2} — ABNORMAL LOW (ref >59)

## 2020-06-04 LAB — CBC WITH DIFFERENTIAL/PLATELET
Basophils Absolute: 0 10*3/uL (ref 0.0–0.2)
Basos: 1 %
EOS (ABSOLUTE): 0.1 10*3/uL (ref 0.0–0.4)
Eos: 3 %
Hematocrit: 35 % (ref 34.0–46.6)
Hemoglobin: 11.6 g/dL (ref 11.1–15.9)
Immature Grans (Abs): 0 10*3/uL (ref 0.0–0.1)
Immature Granulocytes: 0 %
Lymphocytes Absolute: 1.6 10*3/uL (ref 0.7–3.1)
Lymphs: 49 %
MCH: 30.7 pg (ref 26.6–33.0)
MCHC: 33.1 g/dL (ref 31.5–35.7)
MCV: 93 fL (ref 79–97)
Monocytes Absolute: 0.4 10*3/uL (ref 0.1–0.9)
Monocytes: 12 %
Neutrophils Absolute: 1.2 10*3/uL — ABNORMAL LOW (ref 1.4–7.0)
Neutrophils: 35 %
Platelets: 276 10*3/uL (ref 150–450)
RBC: 3.78 x10E6/uL (ref 3.77–5.28)
RDW: 12.9 % (ref 11.7–15.4)
WBC: 3.4 10*3/uL (ref 3.4–10.8)

## 2020-06-04 LAB — TSH: TSH: 0.718 u[IU]/mL (ref 0.450–4.500)

## 2020-06-04 LAB — HEPATITIS C ANTIBODY: Hep C Virus Ab: 0.1 s/co ratio (ref 0.0–0.9)

## 2020-06-04 NOTE — Progress Notes (Signed)
Her labs are stable. No reason to add or change medicines.

## 2020-06-07 ENCOUNTER — Encounter: Payer: Self-pay | Admitting: Nurse Practitioner

## 2020-06-07 ENCOUNTER — Other Ambulatory Visit: Payer: Self-pay

## 2020-06-07 ENCOUNTER — Ambulatory Visit (INDEPENDENT_AMBULATORY_CARE_PROVIDER_SITE_OTHER): Payer: Medicare Other | Admitting: Nurse Practitioner

## 2020-06-07 VITALS — BP 103/80 | HR 76 | Temp 98.0°F | Resp 18 | Ht 61.0 in | Wt 100.0 lb

## 2020-06-07 DIAGNOSIS — Z0000 Encounter for general adult medical examination without abnormal findings: Secondary | ICD-10-CM

## 2020-06-07 DIAGNOSIS — Z0001 Encounter for general adult medical examination with abnormal findings: Secondary | ICD-10-CM

## 2020-06-07 DIAGNOSIS — J4540 Moderate persistent asthma, uncomplicated: Secondary | ICD-10-CM | POA: Diagnosis not present
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
## 2020-06-07 NOTE — Patient Instructions (Addendum)
For prescriptions, try the GoodRx card. It will decrease medication costs at [facility] by about 75%.  [place] operates an outpatient pharmacy at [facility], and they even deliver. You can call them at [phone], and they may have lower medication prices. If you decide to go with the [facility] pharmacy, please let us know so we can send your prescriptions there.   Please have fasting labs drawn 2-3 days prior to your appointment so we can discuss the results during your office visit.

## 2020-06-07 NOTE — Assessment & Plan Note (Signed)
-  she states that albuterol and advair together cost about $500 at [facility] pharmacy -we discussed GoodRx card and that should reduce her costs about 75% -she is at risk of non-adherence d/t medication costs

## 2020-06-10 ENCOUNTER — Ambulatory Visit (HOSPITAL_COMMUNITY)
Admission: RE | Admit: 2020-06-10 | Discharge: 2020-06-10 | Disposition: A | Discharge status: Home / Self Care | Payer: Medicare Other | Source: Ambulatory Visit | Attending: Cardiology | Admitting: Cardiology

## 2020-06-10 ENCOUNTER — Other Ambulatory Visit: Payer: Self-pay

## 2020-06-10 DIAGNOSIS — R011 Cardiac murmur, unspecified: Secondary | ICD-10-CM | POA: Diagnosis present

## 2020-06-10 LAB — ECHOCARDIOGRAM COMPLETE
Area-P 1/2: 2.34 cm2
S' Lateral: 1.75 cm

## 2020-06-10 NOTE — Progress Notes (Signed)
*  PRELIMINARY RESULTS* Echocardiogram 2D Echocardiogram has been performed.  Stacey Drain 06/10/2020, 2:42 PM

## 2020-06-23 ENCOUNTER — Ambulatory Visit: Payer: Self-pay | Admitting: Physician Assistant

## 2020-06-24 ENCOUNTER — Other Ambulatory Visit: Payer: Self-pay | Admitting: Nurse Practitioner

## 2020-06-24 DIAGNOSIS — K219 Gastro-esophageal reflux disease without esophagitis: Secondary | ICD-10-CM

## 2020-06-24 DIAGNOSIS — F339 Major depressive disorder, recurrent, unspecified: Secondary | ICD-10-CM

## 2020-07-14 NOTE — Telephone Encounter (Signed)
Unable reach to conduct bp health coaching will attempt to call back later time

## 2020-08-17 ENCOUNTER — Other Ambulatory Visit: Payer: Self-pay | Admitting: Nurse Practitioner

## 2020-08-17 DIAGNOSIS — K219 Gastro-esophageal reflux disease without esophagitis: Secondary | ICD-10-CM

## 2020-08-17 DIAGNOSIS — F339 Major depressive disorder, recurrent, unspecified: Secondary | ICD-10-CM

## 2020-09-15 ENCOUNTER — Other Ambulatory Visit: Payer: Self-pay | Admitting: Nurse Practitioner

## 2020-09-15 DIAGNOSIS — F339 Major depressive disorder, recurrent, unspecified: Secondary | ICD-10-CM

## 2020-09-15 DIAGNOSIS — K219 Gastro-esophageal reflux disease without esophagitis: Secondary | ICD-10-CM

## 2020-10-18 NOTE — Telephone Encounter (Signed)
Follow up completed for weekly coaching session for remote monitoring hypertension

## 2020-10-19 ENCOUNTER — Other Ambulatory Visit: Payer: Self-pay | Admitting: Nurse Practitioner

## 2020-10-19 DIAGNOSIS — K219 Gastro-esophageal reflux disease without esophagitis: Secondary | ICD-10-CM

## 2020-10-19 DIAGNOSIS — F339 Major depressive disorder, recurrent, unspecified: Secondary | ICD-10-CM

## 2020-11-16 ENCOUNTER — Other Ambulatory Visit: Payer: Self-pay | Admitting: Nurse Practitioner

## 2020-11-16 DIAGNOSIS — K219 Gastro-esophageal reflux disease without esophagitis: Secondary | ICD-10-CM

## 2020-11-16 DIAGNOSIS — F339 Major depressive disorder, recurrent, unspecified: Secondary | ICD-10-CM

## 2020-12-08 ENCOUNTER — Encounter: Payer: Medicare Other | Admitting: Nurse Practitioner

## 2020-12-14 ENCOUNTER — Other Ambulatory Visit: Payer: Self-pay | Admitting: Family Medicine

## 2020-12-14 DIAGNOSIS — K219 Gastro-esophageal reflux disease without esophagitis: Secondary | ICD-10-CM

## 2020-12-14 DIAGNOSIS — F339 Major depressive disorder, recurrent, unspecified: Secondary | ICD-10-CM

## 2020-12-23 LAB — CMP14+EGFR
ALT: 21 IU/L (ref 0–32)
AST: 23 IU/L (ref 0–40)
Albumin/Globulin Ratio: 1 — ABNORMAL LOW (ref 1.2–2.2)
Albumin: 3.8 g/dL (ref 3.8–4.8)
Alkaline Phosphatase: 105 IU/L (ref 44–121)
BUN/Creatinine Ratio: 17 (ref 12–28)
BUN: 19 mg/dL (ref 8–27)
Bilirubin Total: 0.3 mg/dL (ref 0.0–1.2)
CO2: 28 mmol/L (ref 20–29)
Calcium: 9.2 mg/dL (ref 8.7–10.3)
Chloride: 103 mmol/L (ref 96–106)
Creatinine, Ser: 1.13 mg/dL — ABNORMAL HIGH (ref 0.57–1.00)
Globulin, Total: 3.8 g/dL (ref 1.5–4.5)
Glucose: 78 mg/dL (ref 70–99)
Potassium: 4.6 mmol/L (ref 3.5–5.2)
Sodium: 142 mmol/L (ref 134–144)
Total Protein: 7.6 g/dL (ref 6.0–8.5)
eGFR: 54 mL/min/{1.73_m2} — ABNORMAL LOW (ref >59)

## 2020-12-23 LAB — CBC WITH DIFFERENTIAL/PLATELET
Basophils Absolute: 0 10*3/uL (ref 0.0–0.2)
Basos: 1 %
EOS (ABSOLUTE): 0.1 10*3/uL (ref 0.0–0.4)
Eos: 2 %
Hematocrit: 35.8 % (ref 34.0–46.6)
Hemoglobin: 11.6 g/dL (ref 11.1–15.9)
Immature Grans (Abs): 0 10*3/uL (ref 0.0–0.1)
Immature Granulocytes: 0 %
Lymphocytes Absolute: 2.3 10*3/uL (ref 0.7–3.1)
Lymphs: 53 %
MCH: 31 pg (ref 26.6–33.0)
MCHC: 32.4 g/dL (ref 31.5–35.7)
MCV: 96 fL (ref 79–97)
Monocytes Absolute: 0.5 10*3/uL (ref 0.1–0.9)
Monocytes: 12 %
Neutrophils Absolute: 1.4 10*3/uL (ref 1.4–7.0)
Neutrophils: 32 %
Platelets: 258 10*3/uL (ref 150–450)
RBC: 3.74 x10E6/uL — ABNORMAL LOW (ref 3.77–5.28)
RDW: 12.7 % (ref 11.7–15.4)
WBC: 4.2 10*3/uL (ref 3.4–10.8)

## 2020-12-23 LAB — LIPID PANEL WITH LDL/HDL RATIO
Cholesterol, Total: 154 mg/dL (ref 100–199)
HDL: 74 mg/dL (ref >39)
LDL Chol Calc (NIH): 67 mg/dL (ref 0–99)
LDL/HDL Ratio: 0.9 ratio (ref 0.0–3.2)
Triglycerides: 64 mg/dL (ref 0–149)
VLDL Cholesterol Cal: 13 mg/dL (ref 5–40)

## 2020-12-24 ENCOUNTER — Encounter: Payer: Medicare Other | Admitting: Nurse Practitioner

## 2021-01-05 ENCOUNTER — Other Ambulatory Visit: Payer: Self-pay

## 2021-01-05 ENCOUNTER — Encounter: Payer: Medicare Other | Admitting: Nurse Practitioner

## 2021-01-05 ENCOUNTER — Encounter: Payer: Self-pay | Admitting: Nurse Practitioner

## 2021-01-05 ENCOUNTER — Ambulatory Visit: Payer: Medicare Other | Admitting: Nurse Practitioner

## 2021-01-05 ENCOUNTER — Encounter (INDEPENDENT_AMBULATORY_CARE_PROVIDER_SITE_OTHER): Payer: Self-pay

## 2021-01-05 ENCOUNTER — Ambulatory Visit (INDEPENDENT_AMBULATORY_CARE_PROVIDER_SITE_OTHER): Payer: Medicare Other | Admitting: Nurse Practitioner

## 2021-01-05 DIAGNOSIS — J069 Acute upper respiratory infection, unspecified: Secondary | ICD-10-CM | POA: Diagnosis not present

## 2021-01-05 DIAGNOSIS — J4540 Moderate persistent asthma, uncomplicated: Secondary | ICD-10-CM | POA: Diagnosis not present

## 2021-01-05 MED ORDER — BENZONATATE 100 MG PO CAPS: 100.0000 mg | ORAL_CAPSULE | Freq: Two times a day (BID) | ORAL | 0 refills | Status: DC | PRN

## 2021-01-05 MED ORDER — FLUTICASONE-SALMETEROL 250-50 MCG/ACT IN AEPB: 1.0000 | INHALATION_SPRAY | Freq: Two times a day (BID) | RESPIRATORY_TRACT | 3 refills | Status: DC

## 2021-01-05 NOTE — Assessment & Plan Note (Signed)
tessalon 100mg  BID PRN Albuterol inhaler as needed for sob OTC tylenol as needed for fever.

## 2021-01-05 NOTE — Progress Notes (Signed)
Virtual Visit via Telephone Note  I connected with Grace Cross on 01/05/21 at 0853 by telephone and verified that I am speaking with the correct person using two identifiers. I spent a total of 10 minutes talking to the patient and reviewing her chart   Location: Patient: home Provider: office   I discussed the limitations, risks, security and privacy concerns of performing an evaluation and management service by telephone and the availability of in person appointments. I also discussed with the patient that there may be a patient responsible charge related to this service. The patient expressed understanding and agreed to proceed.   History of Present Illness: Pt c/o dry cough , SOB, nasal congestion, a little body ache, sore throat  pounding HA 3/10, that started last Thursday The first 3 days she had fever, she denies wheezing , bloody sputum, CP . She has not been around anyone sick. She has had the first 2 COVID vaccines, has not had her flu vaccine.  Ny quil helped her symptoms some.    Observations/Objective:   Assessment and Plan: Upper respiratory cough with congestion most likely viral in origin  Tessalon 100mg  BID Continue Albuterol inhaler PRN  and advir BID  Tylenol as needed for fever and HA  Follow Up Instructions:    I discussed the assessment and treatment plan with the patient. The patient was provided an opportunity to ask questions and all were answered. The patient agreed with the plan and demonstrated an understanding of the instructions.   The patient was advised to call back or seek an in-person evaluation if the symptoms worsen or if the condition fails to improve as anticipated.

## 2021-01-05 NOTE — Assessment & Plan Note (Signed)
Advair refilled.

## 2021-01-05 NOTE — Progress Notes (Signed)
Creatinine has improved. You see her on 12/16.

## 2021-01-12 ENCOUNTER — Other Ambulatory Visit: Payer: Self-pay | Admitting: Nurse Practitioner

## 2021-01-12 DIAGNOSIS — F339 Major depressive disorder, recurrent, unspecified: Secondary | ICD-10-CM

## 2021-01-12 DIAGNOSIS — K219 Gastro-esophageal reflux disease without esophagitis: Secondary | ICD-10-CM

## 2021-01-14 ENCOUNTER — Other Ambulatory Visit: Payer: Self-pay

## 2021-01-14 ENCOUNTER — Ambulatory Visit (INDEPENDENT_AMBULATORY_CARE_PROVIDER_SITE_OTHER): Payer: Medicare Other | Admitting: Nurse Practitioner

## 2021-01-14 ENCOUNTER — Encounter: Payer: Self-pay | Admitting: Nurse Practitioner

## 2021-01-14 VITALS — BP 122/82 | HR 100 | Ht 61.0 in | Wt 99.0 lb

## 2021-01-14 DIAGNOSIS — Z780 Asymptomatic menopausal state: Secondary | ICD-10-CM | POA: Diagnosis not present

## 2021-01-14 DIAGNOSIS — J029 Acute pharyngitis, unspecified: Secondary | ICD-10-CM | POA: Diagnosis not present

## 2021-01-14 DIAGNOSIS — Z1329 Encounter for screening for other suspected endocrine disorder: Secondary | ICD-10-CM

## 2021-01-14 DIAGNOSIS — Z23 Encounter for immunization: Secondary | ICD-10-CM | POA: Diagnosis not present

## 2021-01-14 DIAGNOSIS — Z139 Encounter for screening, unspecified: Secondary | ICD-10-CM

## 2021-01-14 DIAGNOSIS — Z13820 Encounter for screening for osteoporosis: Secondary | ICD-10-CM | POA: Insufficient documentation

## 2021-01-14 DIAGNOSIS — G8929 Other chronic pain: Secondary | ICD-10-CM

## 2021-01-14 DIAGNOSIS — Z13220 Encounter for screening for lipoid disorders: Secondary | ICD-10-CM

## 2021-01-14 DIAGNOSIS — J4540 Moderate persistent asthma, uncomplicated: Secondary | ICD-10-CM | POA: Diagnosis not present

## 2021-01-14 DIAGNOSIS — Z0001 Encounter for general adult medical examination with abnormal findings: Secondary | ICD-10-CM | POA: Diagnosis not present

## 2021-01-14 DIAGNOSIS — Z1321 Encounter for screening for nutritional disorder: Secondary | ICD-10-CM

## 2021-01-14 DIAGNOSIS — I10 Essential (primary) hypertension: Secondary | ICD-10-CM

## 2021-01-14 DIAGNOSIS — R519 Headache, unspecified: Secondary | ICD-10-CM

## 2021-01-14 DIAGNOSIS — Z0000 Encounter for general adult medical examination without abnormal findings: Secondary | ICD-10-CM

## 2021-01-14 LAB — POCT RAPID STREP A (OFFICE): Rapid Strep A Screen: NEGATIVE

## 2021-01-14 MED ORDER — CHLORASEPTIC 1.4 % MT LIQD: 1.0000 | OROMUCOSAL | 0 refills | Status: DC | PRN

## 2021-01-14 NOTE — Assessment & Plan Note (Signed)
Pt can not afford Advair. referral to clinical pharmacist for med assistance.

## 2021-01-14 NOTE — Patient Instructions (Addendum)
Please get  your lab work done  3-5 days before your next visit.   Please get your shingles, covid and tdap vaccines at your pharmacy.   Use chloraseptic spray for sore throat as needed  It is important that you exercise regularly at least 30 minutes 5 times a week.  Think about what you will eat, plan ahead. Choose " clean, green, fresh or frozen" over canned, processed or packaged foods which are more sugary, salty and fatty. 70 to 75% of food eaten should be vegetables and fruit. Three meals at set times with snacks allowed between meals, but they must be fruit or vegetables. Aim to eat over a 12 hour period , example 7 am to 7 pm, and STOP after  your last meal of the day. Drink water,generally about 64 ounces per day, no other drink is as healthy. Fruit juice is best enjoyed in a healthy way, by EATING the fruit.  Thanks for choosing [facility], we consider it a privelige to serve you.

## 2021-01-14 NOTE — Progress Notes (Addendum)
Subjective:    Grace Cross is a 65 y.o. female who presents for a Welcome to Medicare exam. MOST form and Advanced directive forms given to patient with instructions to get forms completed.   Review of Systems Pt c/o of sore throat , she was recently treated for URI, denies fever, chills , coughing up blood , chest pain, has history of dysphagia.   Pt c/o chronic chronic blackout spells, Headaches tremors and feelings of passing out, that happens sometimes when she is walking. She sometimes have to hold on to something to prevent falling. she stated that she has she has seen cardiology earlier this year about the symptoms but nothing was done.         Objective:   No sign of distress noted,thin appearing lung sounds are clear,non labored breathing at rest, heart sound normal regular rate and rhythm, alert and oriented x3.   BP 122/82, HR 100. BMI18.71  There were no vitals filed for this visit.There is no height or weight on file to calculate BMI.  Medications Outpatient Encounter Medications as of 01/14/2021  Medication Sig   acetaminophen (TYLENOL) 500 MG tablet Take 500 mg by mouth every 6 (six) hours as needed.   albuterol (PROAIR HFA) 108 (90 Base) MCG/ACT inhaler Inhale 2 puffs into the lungs every 6 (six) hours as needed. For shortness of breath   benzonatate (TESSALON) 100 MG capsule Take 1 capsule (100 mg total) by mouth 2 (two) times daily as needed for cough.   citalopram (CELEXA) 20 MG tablet Take 1 tablet by mouth once daily   fluticasone-salmeterol (ADVAIR) 250-50 MCG/ACT AEPB Inhale 1 puff into the lungs in the morning and at bedtime.   Multiple Vitamins-Minerals (CENTRUM SILVER PO) Take by mouth.   omeprazole (PRILOSEC) 40 MG capsule Take 1 capsule by mouth once daily   No facility-administered encounter medications on file as of 01/14/2021.     History: Past Medical History:  Diagnosis Date   Acid reflux    Asthma    Asthma    [employer] 05/21/2020    Depression    Depression    [employer] 05/21/2020   Hypertension    Hypothyroidism    used to take thyroid medication   Past Surgical History:  Procedure Laterality Date   COLONOSCOPY, ESOPHAGOGASTRODUODENOSCOPY (EGD) AND ESOPHAGEAL DILATION N/A 06/17/2012   UMP:NTIR IN CECUM/Mod IH/ESO ring/Gastritis    Family History  Problem Relation Age of Onset   Asthma Mother    Heart attack Father    Congestive Heart Failure Father    Diabetes Sister    Hyperlipidemia Brother    Hypertension Brother    Asthma Brother    Hypertension Brother    Colon cancer Neg Hx    Inflammatory bowel disease Neg Hx    Social History   Occupational History   Occupation: unemployed  Tobacco Use   Smoking status: Former    Packs/day: 0.50    Years: 22.00    Pack years: 11.00    Types: Cigarettes    Quit date: 01/31/1995    Years since quitting: 25.9   Smokeless tobacco: Never  Vaping Use   Vaping Use: Never used  Substance and Sexual Activity   Alcohol use: Not Currently    Comment: occ 1-2 beer   Drug use: No   Sexual activity: Not on file    Tobacco Counseling Counseling given: Not Answered   Immunizations and Health Maintenance Immunization History  Administered Date(s) Administered   PFIZER(Purple  Top)SARS-COV-2 Vaccination 09/05/2019, 10/10/2019   Health Maintenance Due  Topic Date Due   Pneumonia Vaccine 81+ Years old (1 - PCV) Never done   TETANUS/TDAP  Never done   Zoster Vaccines- Shingrix (1 of 2) Never done   PAP SMEAR-Modifier  Never done   COVID-19 Vaccine (3 - Pfizer risk series) 11/07/2019   INFLUENZA VACCINE  Never done   DEXA SCAN  Never done    Activities of Daily Living No flowsheet data found.  Physical Exam  (optional), or other factors deemed appropriate based on the beneficiary's medical and social history and current clinical standards.  Advanced Directives:      Assessment:    This is a routine wellness examination for this patient .    Vision/Hearing screen No results found.  Dietary issues and exercise activities discussed:      Goals   None   Depression Screen PHQ 2/9 Scores 01/05/2021 06/07/2020 05/24/2020  PHQ - 2 Score 0 0 1     Fall Risk Fall Risk  01/05/2021  Falls in the past year? 0  Number falls in past yr: 0  Injury with Fall? 0  Risk for fall due to : -  Follow up -    Cognitive Function:        Patient Care Team: Heather Roberts, NP as PCP - General (Nurse Practitioner) Jonelle Sidle, MD as PCP - Cardiology (Cardiology) West Bali, MD (Inactive) as Consulting Physician (Gastroenterology)     Plan:    Feelings of black out: EKG and blood pressure normal in the office today. Pt advised to follow up with cardiology for persistent symptoms.   Sore throat; Rapid Strep A negative, use chloraseptic spray as needed.   Chronic Headaches with tremors: Will refer to neurology, use tylenol as needed.    I have personally reviewed and noted the following in the patients chart:   Medical and social history Use of alcohol, tobacco or illicit drugs  Current medications and supplements Functional ability and status Nutritional status Physical activity Advanced directives List of other physicians Hospitalizations, surgeries, and ER visits in previous 12 months Vitals Screenings to include cognitive, depression, and falls Referrals and appointments  In addition, I have reviewed and discussed with patient certain preventive protocols, quality metrics, and best practice recommendations. A written personalized care plan for preventive services as well as general preventive health recommendations were provided to patient.     Sydnee Levans, CMA 01/14/2021  I have reviewed and agree with the above AWV documentation.  Folashade R. Geoffery Spruce, FNP [facility] Location Provider: office Location Patient: office

## 2021-01-14 NOTE — Assessment & Plan Note (Signed)
Strep negative. Sore throat mot likely  from coughing  Use Chloraseptic spray PRN

## 2021-01-14 NOTE — Assessment & Plan Note (Signed)
-   DEXA scan ordered

## 2021-01-28 ENCOUNTER — Telehealth: Payer: Self-pay | Admitting: *Deleted

## 2021-01-28 NOTE — Chronic Care Management (AMB) (Signed)
Chronic Care Management   Note  01/28/2021 Name: [name] MRN: [id number] DOB: 1955-04-29  Grace Cross is a 65 y.o. year old female who is a primary care patient of Renee Rival, FNP. I reached out to Grace Cross by phone today in response to a referral sent by Ms. Jeffers PCP.  Ms. Coye was given information about Chronic Care Management services today including:  CCM service includes personalized support from designated clinical staff supervised by her physician, including individualized plan of care and coordination with other care providers 24/7 contact phone numbers for assistance for urgent and routine care needs. Service will only be billed when office clinical staff spend 20 minutes or more in a month to coordinate care. Only one practitioner may furnish and bill the service in a calendar month. The patient may stop CCM services at any time (effective at the end of the month) by phone call to the office staff. The patient is responsible for co-pay (up to 20% after annual deductible is met) if co-pay is required by the individual health plan.   Patient agreed to services and verbal consent obtained.   Follow up plan: Face to Face appointment with care management team member scheduled for: 02/23/21  [facility]  Direct Dial: [phone]

## 2021-02-16 ENCOUNTER — Other Ambulatory Visit: Payer: Self-pay | Admitting: *Deleted

## 2021-02-16 DIAGNOSIS — K219 Gastro-esophageal reflux disease without esophagitis: Secondary | ICD-10-CM

## 2021-02-16 DIAGNOSIS — F339 Major depressive disorder, recurrent, unspecified: Secondary | ICD-10-CM

## 2021-02-16 MED ORDER — OMEPRAZOLE 40 MG PO CPDR: 40.0000 mg | DELAYED_RELEASE_CAPSULE | Freq: Every day | ORAL | 0 refills | Status: DC

## 2021-02-16 MED ORDER — CITALOPRAM HYDROBROMIDE 20 MG PO TABS: 20.0000 mg | ORAL_TABLET | Freq: Every day | ORAL | 0 refills | Status: DC

## 2021-02-23 ENCOUNTER — Ambulatory Visit: Payer: Medicare Other

## 2021-02-23 ENCOUNTER — Ambulatory Visit (INDEPENDENT_AMBULATORY_CARE_PROVIDER_SITE_OTHER): Payer: Medicare Other | Admitting: Pharmacist

## 2021-02-23 DIAGNOSIS — J4540 Moderate persistent asthma, uncomplicated: Secondary | ICD-10-CM

## 2021-02-23 DIAGNOSIS — F339 Major depressive disorder, recurrent, unspecified: Secondary | ICD-10-CM

## 2021-02-23 MED ORDER — BUDESONIDE-FORMOTEROL FUMARATE 160-4.5 MCG/ACT IN AERO: 2.0000 | INHALATION_SPRAY | Freq: Two times a day (BID) | RESPIRATORY_TRACT | 3 refills | Status: DC

## 2021-02-23 NOTE — Patient Instructions (Signed)
Grace Cross,  It was great to talk to you today!  Please call me with any questions or concerns.  Visit Information   Following is a copy of your full care plan:  Care Plan : Medication Management  Updates made by Grace Cross, Grace Cross since 02/23/2021 12:00 AM     Problem: Asthma and Depression   Priority: High  Onset Date: 02/23/2021     Long-Range Goal: Disease Progression Prevention   Start Date: 02/23/2021  Expected End Date: 05/24/2021  This Visit's Progress: On track  Priority: High  Note:   Current Barriers:  Unable to independently afford treatment regimen Unable to achieve control of asthma Suboptimal therapeutic regimen for asthma  Pharmacist Clinical Goal(s):  Through collaboration with PharmD and provider, patient will  Verbalize ability to afford treatment regimen Achieve control of asthma as evidenced by improved shortness of breath Adhere to plan to optimize therapeutic regimen for asthma as evidenced by report of adherence to recommended medication management changes   Interventions: 1:1 collaboration with Grace Rival, FNP regarding development and update of comprehensive plan of care as evidenced by provider attestation and co-signature Inter-disciplinary care team collaboration (see longitudinal plan of care) Comprehensive medication review performed; medication list updated in electronic medical record  Asthma - New goal.: Uncontrolled per patient. Reports having to use albuterol three times a week. Most shortness of breath appears to be on exertion.  Current treatment: albuterol 2 puffs by mouth every 6 hours as needed for shortness of breath or wheezing and fluticasone/salmeterol (AirDuo RespiClick) 74-25 mcg 1 puff by mouth twice daily Patient reports she was prescribed Advair by another provider prior to her care at our office. She was given a sample which she has run out of and she is not able to afford her copay. Discussed with primary  care provider and based on patient's current household size and income, she qualifies for patient assistance. Patient assistance program application completed over the phone today for Symbicort 160-4.5 mcg through [employer]. Patient has been approved through 01/29/22. Rx sent to [facility] pharmacy who completes fulfillment for [employer]. Anticipate patient will receive first shipment in 3-4 weeks. Will follow-up in 4 weeks.  Continue albuterol 2 puffs by mouth every 6 hours as needed for shortness of breath or wheezing Add budesonide/formoterol (Symbicort HFA) 160-4.5 mcg 2 puffs by mouth twice daily Discontinue fluticasone/salmeterol (AirDuo RespiClick) Reminded patient to rinse mouth with water and spit after using ICS containing inhaler to prevent thrush  Depression - Condition stable. Not addressed this visit.: Stable Current medications: citalopram (Celexa) 20 mg by mouth once daily Current non-pharmacologic treatment: unknown Current symptoms include:  unknown Continue citalopram (Celexa) 20 mg by mouth once daily  Patient Goals/Self-Care Activities Patient will:  Take medications as prescribed Collaborate with provider on medication access solutions  Follow Up Plan: Telephone follow up appointment with care management team member scheduled for: 03/23/21      Consent to CCM Services: Grace Cross was given information about Chronic Care Management services including:  CCM service includes personalized support from designated clinical staff supervised by her physician, including individualized plan of care and coordination with other care providers 24/7 contact phone numbers for assistance for urgent and routine care needs. Service will only be billed when office clinical staff spend 20 minutes or more in a month to coordinate care. Only one practitioner may furnish and bill the service in a calendar month. The patient may stop CCM services at any time (effective at  the end of the month) by  phone call to the office staff. The patient will be responsible for cost sharing (co-pay) of up to 20% of the service fee (after annual deductible is met).  Patient agreed to services and verbal consent obtained.   Plan: Telephone follow up appointment with care management team member scheduled for:  03/23/21  The patient verbalized understanding of instructions, educational materials, and care plan provided today and agreed to receive a mailed copy of patient instructions, educational materials, and care plan.   Please call the care guide team at [phone] if you need to cancel or reschedule your appointment.   Grace Cross, PharmD, Para March, CPP Clinical Pharmacist Practitioner [facility] [phone]

## 2021-02-23 NOTE — Chronic Care Management (AMB) (Signed)
Chronic Care Management Pharmacy Note  02/23/2021 Name:  Grace Cross MRN:  [id number] DOB:  12/28/1955  Summary: Asthma Patient reports she was prescribed Advair by another provider prior to her care at our office. She was given a sample which she has run out of and she is not able to afford her copay. Discussed with primary care provider and based on patient's current household size and income, she qualifies for patient assistance. Patient assistance program application completed over the phone today for Symbicort 160-4.5 mcg through [employer]. Patient has been approved through 01/29/22. Rx sent to [facility] pharmacy who completes fulfillment for [employer]ME. Anticipate patient will receive first shipment in 3-4 weeks. Will follow-up in 4 weeks.  Continue albuterol 2 puffs by mouth every 6 hours as needed for shortness of breath or wheezing Add budesonide/formoterol (Symbicort HFA) 160-4.5 mcg 2 puffs by mouth twice daily Discontinue fluticasone/salmeterol (AirDuo RespiClick) Reminded patient to rinse mouth with water and spit after using ICS containing inhaler to prevent thrush  Subjective: Grace Cross is an 66 y.o. year old female who is a primary patient of Paseda, Dewaine Conger, FNP.  The CCM team was consulted for assistance with disease management and care coordination needs.    Engaged with patient by telephone for initial visit in response to provider referral for pharmacy case management and/or care coordination services.   Consent to Services:  The patient was given the following information about Chronic Care Management services today, agreed to services, and gave verbal consent: 1. CCM service includes personalized support from designated clinical staff supervised by the primary care provider, including individualized plan of care and coordination with other care providers 2. 24/7 contact phone numbers for assistance for urgent and routine care needs. 3. Service will only be billed when  office clinical staff spend 20 minutes or more in a month to coordinate care. 4. Only one practitioner may furnish and bill the service in a calendar month. 5.The patient may stop CCM services at any time (effective at the end of the month) by phone call to the office staff. 6. The patient will be responsible for cost sharing (co-pay) of up to 20% of the service fee (after annual deductible is met). Patient agreed to services and consent obtained.  Patient Care Team: Renee Rival, FNP as PCP - General (Nurse Practitioner) Satira Sark, MD as PCP - Cardiology (Cardiology) Danie Binder, MD (Inactive) as Consulting Physician (Gastroenterology) Beryle Lathe, [facility] (Pharmacist)  Objective:  Lab Results  Component Value Date   CREATININE 1.13 (H) 12/22/2020   CREATININE 1.35 (H) 06/03/2020   CREATININE 1.35 (H) 11/11/2019    Lab Results  Component Value Date   HGBA1C 5.2 11/11/2019   Last diabetic Eye exam: No results found for: HMDIABEYEEXA  Last diabetic Foot exam: No results found for: HMDIABFOOTEX      Component Value Date/Time   CHOL 154 12/22/2020 1153   TRIG 64 12/22/2020 1153   HDL 74 12/22/2020 1153   CHOLHDL 2.4 11/11/2019 1119   VLDL 14 11/11/2019 1119   [place] 67 12/22/2020 1153    Hepatic Function Latest Ref Rng & Units 12/22/2020 06/03/2020 11/11/2019  Total Protein 6.0 - 8.5 g/dL 7.6 8.0 8.7(H)  Albumin 3.8 - 4.8 g/dL 3.8 4.1 3.9  AST 0 - 40 IU/L 23 34 29  ALT 0 - 32 IU/L 21 32 24  Alk Phosphatase 44 - 121 IU/L 105 106 89  Total Bilirubin 0.0 - 1.2 mg/dL  0.3 0.3 0.7    Lab Results  Component Value Date/Time   TSH 0.718 06/03/2020 11:15 AM   TSH 2.491 11/11/2019 11:19 AM   TSH 0.202 Test methodology is 3rd generation TSH (L) 02/08/2009 02:46 AM   FREET4 1.02 02/11/2009 12:00 PM    CBC Latest Ref Rng & Units 12/22/2020 06/03/2020 11/11/2019  WBC 3.4 - 10.8 x10E3/uL 4.2 3.4 4.1  Hemoglobin 11.1 - 15.9 g/dL 11.6 11.6 13.0  Hematocrit  34.0 - 46.6 % 35.8 35.0 39.0  Platelets 150 - 450 x10E3/uL 258 276 286    No results found for: VD25OH  Clinical ASCVD: No  The 10-year ASCVD risk score (Arnett DK, et al., 2019) is: 5.2%   Values used to calculate the score:     Age: 66 years     Sex: Female     Is Non-Hispanic African American: Yes     Diabetic: No     Tobacco smoker: No     Systolic Blood Pressure: 626 mmHg     Is BP treated: No     HDL Cholesterol: 74 mg/dL     Total Cholesterol: 154 mg/dL    Social History   Tobacco Use  Smoking Status Former   Packs/day: 0.50   Years: 22.00   Pack years: 11.00   Types: Cigarettes   Quit date: 01/31/1995   Years since quitting: 26.0  Smokeless Tobacco Never   BP Readings from Last 3 Encounters:  01/14/21 122/82  06/07/20 103/80  05/24/20 123/85   Pulse Readings from Last 3 Encounters:  01/14/21 100  06/07/20 76  05/24/20 67   Wt Readings from Last 3 Encounters:  01/14/21 99 lb (44.9 kg)  06/07/20 100 lb (45.4 kg)  05/24/20 99 lb (44.9 kg)    Assessment: Review of patient past medical history, allergies, medications, health status, including review of consultants reports, laboratory and other test data, was performed as part of comprehensive evaluation and provision of chronic care management services.   SDOH:  (Social Determinants of Health) assessments and interventions performed:  SDOH Interventions    Flowsheet Row Most Recent Value  SDOH Interventions   Financial Strain Interventions Other (Comment)  [appplied for medication assistance program]       CCM Care Plan  Allergies  Allergen Reactions   Penicillins Rash    Medications Reviewed Today     Reviewed by Beryle Lathe, [facility] (Pharmacist) on 02/23/21 at [place] List Status: <None>   Medication Order Taking? Sig Documenting Provider Last Dose Status Informant  acetaminophen (TYLENOL) 500 MG tablet [id number] Yes Take 500 mg by mouth every 6 (six) hours as needed. [provider] Taking Active   albuterol (PROAIR HFA) 108 (90 Base) MCG/ACT inhaler [id number] Yes Inhale 2 puffs into the lungs every 6 (six) hours as needed. For shortness of breath Noreene Larsson, NP Taking Active   citalopram (CELEXA) 20 MG tablet [id number] Yes Take 1 tablet (20 mg total) by mouth daily. Renee Rival, FNP Taking Active   fluticasone-salmeterol (ADVAIR) 250-50 MCG/ACT AEPB [id number] No Inhale 1 puff into the lungs in the morning and at bedtime.  Patient not taking: Reported on 02/23/2021   Renee Rival, FNP Not Taking Active   Multiple Vitamins-Minerals (CENTRUM SILVER PO) [id number] Yes Take by mouth. [provider] Taking Active   omeprazole (PRILOSEC) 40 MG capsule [id number] Yes Take 1 capsule (40 mg total) by mouth daily. Renee Rival, FNP Taking Active  Patient Active Problem List   Diagnosis Date Noted   Encounter for Medicare annual wellness exam 01/14/2021   Encounter for screening for osteoporosis 01/14/2021   Post-menopausal 01/14/2021   Sore throat 01/14/2021   Need for immunization against influenza 01/14/2021   URI with cough and congestion 01/05/2021   Encounter to establish care 05/24/2020   Screening due 05/24/2020   Esophageal reflux 05/24/2020   Depression, recurrent ([place]) 05/24/2020   Chest pain 10/19/2017   Orthostatic hypertension 10/19/2017   Asthma 10/19/2017   Esophageal dysphagia 06/12/2012   IBS (irritable bowel syndrome) 06/12/2012    Immunization History  Administered Date(s) Administered   Fluad Quad(high Dose 65+) 01/14/2021   PFIZER(Purple Top)SARS-COV-2 Vaccination 09/05/2019, 10/10/2019    Conditions to be addressed/monitored: Depression and Asthma  Care Plan : Medication Management  Updates made by Beryle Lathe, [place] since 02/23/2021 12:00 AM     Problem: Asthma and Depression   Priority: High  Onset Date: 02/23/2021     Long-Range Goal: Disease Progression  Prevention   Start Date: 02/23/2021  Expected End Date: 05/24/2021  This Visit's Progress: On track  Priority: High  Note:   Current Barriers:  Unable to independently afford treatment regimen Unable to achieve control of asthma Suboptimal therapeutic regimen for asthma  Pharmacist Clinical Goal(s):  Through collaboration with PharmD and provider, patient will  Verbalize ability to afford treatment regimen Achieve control of asthma as evidenced by improved shortness of breath Adhere to plan to optimize therapeutic regimen for asthma as evidenced by report of adherence to recommended medication management changes   Interventions: 1:1 collaboration with Renee Rival, FNP regarding development and update of comprehensive plan of care as evidenced by provider attestation and co-signature Inter-disciplinary care team collaboration (see longitudinal plan of care) Comprehensive medication review performed; medication list updated in electronic medical record  Asthma - New goal.: Uncontrolled per patient. Reports having to use albuterol three times a week. Most shortness of breath appears to be on exertion.  Current treatment: albuterol 2 puffs by mouth every 6 hours as needed for shortness of breath or wheezing and fluticasone/salmeterol (AirDuo RespiClick) 25-95 mcg 1 puff by mouth twice daily Patient reports she was prescribed Advair by another provider prior to her care at our office. She was given a sample which she has run out of and she is not able to afford her copay. Discussed with primary care provider and based on patient's current household size and income, she qualifies for patient assistance. Patient assistance program application completed over the phone today for Symbicort 160-4.5 mcg through AZ[employer]ME. Patient has been approved through 01/29/22. Rx sent to [facility] pharmacy who completes fulfillment for AZ[employer]ME. Anticipate patient will receive first shipment in 3-4 weeks. Will  follow-up in 4 weeks.  Continue albuterol 2 puffs by mouth every 6 hours as needed for shortness of breath or wheezing Add budesonide/formoterol (Symbicort HFA) 160-4.5 mcg 2 puffs by mouth twice daily Discontinue fluticasone/salmeterol (AirDuo RespiClick) Reminded patient to rinse mouth with water and spit after using ICS containing inhaler to prevent thrush  Depression - Condition stable. Not addressed this visit.: Stable Current medications: citalopram (Celexa) 20 mg by mouth once daily Current non-pharmacologic treatment: unknown Current symptoms include:  unknown Continue citalopram (Celexa) 20 mg by mouth once daily  Patient Goals/Self-Care Activities Patient will:  Take medications as prescribed Collaborate with provider on medication access solutions  Follow Up Plan: Telephone follow up appointment with care management team member scheduled for: 03/23/21  Medication Assistance: Application for Symbicort  medication assistance program. in process.  Anticipated assistance start date 03/23/21.  See plan of care for additional detail.  Patient's preferred pharmacy is:  [place], [place] - [address] [place] [address] Phone: [phone] Fax: [phone]  [facility], [place], [place] [address] [place] [address] Phone: [phone] Fax: [phone]  [facility] - [place], [place] [address] [place] [address] Phone: [phone] Fax: [phone]  Follow Up:  Patient agrees to Care Plan and Follow-up.  Plan: Telephone follow up appointment with care management team member scheduled for:  03/23/21  Kennon Holter, PharmD, [place], [place] Clinical Pharmacist Practitioner [facility] [phone]

## 2021-03-01 DIAGNOSIS — J4540 Moderate persistent asthma, uncomplicated: Secondary | ICD-10-CM

## 2021-03-01 DIAGNOSIS — F339 Major depressive disorder, recurrent, unspecified: Secondary | ICD-10-CM

## 2021-03-04 ENCOUNTER — Ambulatory Visit: Payer: Self-pay | Admitting: Neurology

## 2021-03-11 ENCOUNTER — Other Ambulatory Visit: Payer: Self-pay | Admitting: Nurse Practitioner

## 2021-03-11 DIAGNOSIS — F339 Major depressive disorder, recurrent, unspecified: Secondary | ICD-10-CM

## 2021-03-11 DIAGNOSIS — K219 Gastro-esophageal reflux disease without esophagitis: Secondary | ICD-10-CM

## 2021-03-16 ENCOUNTER — Ambulatory Visit: Payer: Self-pay | Admitting: Neurology

## 2021-03-16 ENCOUNTER — Encounter: Payer: Self-pay | Admitting: Neurology

## 2021-03-23 ENCOUNTER — Ambulatory Visit (INDEPENDENT_AMBULATORY_CARE_PROVIDER_SITE_OTHER): Payer: Medicare Other | Admitting: Pharmacist

## 2021-03-23 DIAGNOSIS — F339 Major depressive disorder, recurrent, unspecified: Secondary | ICD-10-CM

## 2021-03-23 DIAGNOSIS — J4540 Moderate persistent asthma, uncomplicated: Secondary | ICD-10-CM

## 2021-03-23 NOTE — Patient Instructions (Signed)
Valarie Cones,  It was great to talk to you today!  Please call me with any questions or concerns.  Visit Information  Following are the goals we discussed today:   Goals Addressed             This Visit's Progress    Medication Management       Patient Goals/Self-Care Activities Patient will:  Take medications as prescribed Collaborate with provider on medication access solutions          Follow-up plan: Telephone follow up appointment with care management team member scheduled for:  05/25/21  The patient verbalized understanding of instructions, educational materials, and care plan provided today and declined offer to receive copy of patient instructions, educational materials, and care plan.   Please call the care guide team at [phone] if you need to cancel or reschedule your appointment.   Domenic Moras, PharmD, [place], CPP Clinical Pharmacist Practitioner [facility] [phone]

## 2021-03-23 NOTE — Chronic Care Management (AMB) (Signed)
Chronic Care Management Pharmacy Note  03/23/2021 Name:  Grace Cross MRN:  [id number] DOB:  April 27, 1955  Summary: Asthma Current treatment: albuterol 2 puffs by mouth every 6 hours as needed for shortness of breath or wheezing Patient has been approved for budesonide/formoterol (Symbicort HFA) 160-4.5 mcg 2 puffs by mouth twice daily through the [employer] patient assistance program until 01/29/22.  She has not received first shipment so I called [employer] today. I spoke with a representative that was able to expedite her prescription order. Patient will receive a text with tracking number when it ships. It is anticipated to arrive at patient's home in 10-14 business days.  Continue albuterol 2 puffs by mouth every 6 hours as needed for shortness of breath or wheezing Add budesonide/formoterol (Symbicort HFA) 160-4.5 mcg 2 puffs by mouth twice daily Reminded patient to rinse mouth with water and spit after using ICS containing inhaler to prevent thrush  Subjective: Grace Cross is an 66 y.o. year old female who is a primary patient of Renee Rival, FNP.  The CCM team was consulted for assistance with disease management and care coordination needs.    Engaged with patient by telephone for follow up visit in response to provider referral for pharmacy case management and/or care coordination services.   Consent to Services:  The patient was given information about Chronic Care Management services, agreed to services, and gave verbal consent prior to initiation of services.  Please see initial visit note for detailed documentation.   Patient Care Team: Renee Rival, FNP as PCP - General (Nurse Practitioner) Satira Sark, MD as PCP - Cardiology (Cardiology) Danie Binder, MD (Inactive) as Consulting Physician (Gastroenterology) Beryle Lathe, [facility] (Pharmacist)  Objective:  Lab Results  Component Value Date   CREATININE 1.13 (H) 12/22/2020   CREATININE 1.35 (H)  06/03/2020   CREATININE 1.35 (H) 11/11/2019    Lab Results  Component Value Date   HGBA1C 5.2 11/11/2019   Last diabetic Eye exam: No results found for: HMDIABEYEEXA  Last diabetic Foot exam: No results found for: HMDIABFOOTEX      Component Value Date/Time   CHOL 154 12/22/2020 1153   TRIG 64 12/22/2020 1153   HDL 74 12/22/2020 1153   CHOLHDL 2.4 11/11/2019 1119   VLDL 14 11/11/2019 1119   [place] 67 12/22/2020 1153    Hepatic Function Latest Ref Rng & Units 12/22/2020 06/03/2020 11/11/2019  Total Protein 6.0 - 8.5 g/dL 7.6 8.0 8.7(H)  Albumin 3.8 - 4.8 g/dL 3.8 4.1 3.9  AST 0 - 40 IU/L 23 34 29  ALT 0 - 32 IU/L 21 32 24  Alk Phosphatase 44 - 121 IU/L 105 106 89  Total Bilirubin 0.0 - 1.2 mg/dL 0.3 0.3 0.7    Lab Results  Component Value Date/Time   TSH 0.718 06/03/2020 11:15 AM   TSH 2.491 11/11/2019 11:19 AM   TSH 0.202 Test methodology is 3rd generation TSH (L) 02/08/2009 02:46 AM   FREET4 1.02 02/11/2009 12:00 PM    CBC Latest Ref Rng & Units 12/22/2020 06/03/2020 11/11/2019  WBC 3.4 - 10.8 x10E3/uL 4.2 3.4 4.1  Hemoglobin 11.1 - 15.9 g/dL 11.6 11.6 13.0  Hematocrit 34.0 - 46.6 % 35.8 35.0 39.0  Platelets 150 - 450 x10E3/uL 258 276 286    No results found for: VD25OH  Clinical ASCVD: No  The 10-year ASCVD risk score (Arnett DK, et al., 2019) is: 5.2%   Values used to calculate the score:  Age: 68 years     Sex: Female     Is Non-Hispanic African American: Yes     Diabetic: No     Tobacco smoker: No     Systolic Blood Pressure: 073 mmHg     Is BP treated: No     HDL Cholesterol: 74 mg/dL     Total Cholesterol: 154 mg/dL     Social History   Tobacco Use  Smoking Status Former   Packs/day: 0.50   Years: 22.00   Pack years: 11.00   Types: Cigarettes   Quit date: 01/31/1995   Years since quitting: 26.1  Smokeless Tobacco Never   BP Readings from Last 3 Encounters:  01/14/21 122/82  06/07/20 103/80  05/24/20 123/85   Pulse Readings from Last 3  Encounters:  01/14/21 100  06/07/20 76  05/24/20 67   Wt Readings from Last 3 Encounters:  01/14/21 99 lb (44.9 kg)  06/07/20 100 lb (45.4 kg)  05/24/20 99 lb (44.9 kg)    Assessment: Review of patient past medical history, allergies, medications, health status, including review of consultants reports, laboratory and other test data, was performed as part of comprehensive evaluation and provision of chronic care management services.   SDOH:  (Social Determinants of Health) assessments and interventions performed:    CCM Care Plan  Allergies  Allergen Reactions   Penicillins Rash    Medications Reviewed Today     Reviewed by Beryle Lathe, [facility] (Pharmacist) on 03/23/21 at 1506  Med List Status: <None>   Medication Order Taking? Sig Documenting Provider Last Dose Status Informant  acetaminophen (TYLENOL) 500 MG tablet [id number] Yes Take 500 mg by mouth every 6 (six) hours as needed. [provider] Taking Active   albuterol (PROAIR HFA) 108 (90 Base) MCG/ACT inhaler [id number] Yes Inhale 2 puffs into the lungs every 6 (six) hours as needed. For shortness of breath Noreene Larsson, NP Taking Active   budesonide-formoterol [facility]) 160-4.5 MCG/ACT inhaler [id number] No Inhale 2 puffs into the lungs in the morning and at bedtime. Rinse your mouth with water and spit after using your inhaler to prevent thrush  Patient not taking: Reported on 03/23/2021   Renee Rival, FNP Not Taking Active   citalopram (CELEXA) 20 MG tablet [id number] Yes Take 1 tablet by mouth once daily Paseda, Dewaine Conger, FNP Taking Active   Multiple Vitamins-Minerals (CENTRUM SILVER PO) [id number] Yes Take by mouth. [provider] Taking Active   omeprazole (PRILOSEC) 40 MG capsule [id number] Yes Take 1 capsule by mouth once daily Renee Rival, FNP Taking Active             Patient Active Problem List   Diagnosis Date Noted   Encounter for Medicare annual wellness exam  01/14/2021   Encounter for screening for osteoporosis 01/14/2021   Post-menopausal 01/14/2021   Sore throat 01/14/2021   Need for immunization against influenza 01/14/2021   URI with cough and congestion 01/05/2021   Encounter to establish care 05/24/2020   Screening due 05/24/2020   Esophageal reflux 05/24/2020   Depression, recurrent ([place]) 05/24/2020   Chest pain 10/19/2017   Orthostatic hypertension 10/19/2017   Asthma 10/19/2017   Esophageal dysphagia 06/12/2012   IBS (irritable bowel syndrome) 06/12/2012    Immunization History  Administered Date(s) Administered   Fluad Quad(high Dose 65+) 01/14/2021   PFIZER(Purple Top)SARS-COV-2 Vaccination 09/05/2019, 10/10/2019    Conditions to be addressed/monitored: Depression and Asthma  Care Plan : Medication Management  Updates made by  Beryle Lathe, [place] since 03/23/2021 12:00 AM     Problem: Asthma and Depression   Priority: High  Onset Date: 02/23/2021     Long-Range Goal: Disease Progression Prevention   Start Date: 02/23/2021  Expected End Date: 05/24/2021  Recent Progress: On track  Priority: High  Note:   Current Barriers:  Unable to independently afford treatment regimen Unable to achieve control of asthma Suboptimal therapeutic regimen for asthma  Pharmacist Clinical Goal(s):  Through collaboration with PharmD and provider, patient will  Verbalize ability to afford treatment regimen Achieve control of asthma as evidenced by improved shortness of breath Adhere to plan to optimize therapeutic regimen for asthma as evidenced by report of adherence to recommended medication management changes   Interventions: 1:1 collaboration with Renee Rival, FNP regarding development and update of comprehensive plan of care as evidenced by provider attestation and co-signature Inter-disciplinary care team collaboration (see longitudinal plan of care) Comprehensive medication review performed; medication list  updated in electronic medical record  Asthma - New goal.: Uncontrolled per patient. Reports having to use albuterol three times a week. Most shortness of breath appears to be on exertion.  Current treatment: albuterol 2 puffs by mouth every 6 hours as needed for shortness of breath or wheezing Patient has been approved for budesonide/formoterol (Symbicort HFA) 160-4.5 mcg 2 puffs by mouth twice daily through the AZ[employer]ME patient assistance program until 01/29/22. She has not received first shipment so I called [employer] today. I spoke with a representative that was able to expedite her prescription order. Patient will receive a text with tracking number when it ships. It is anticipated to arrive at patient's home in 10-14 business days.  Continue albuterol 2 puffs by mouth every 6 hours as needed for shortness of breath or wheezing Add budesonide/formoterol (Symbicort HFA) 160-4.5 mcg 2 puffs by mouth twice daily Reminded patient to rinse mouth with water and spit after using ICS containing inhaler to prevent thrush  Depression - Condition stable. Not addressed this visit.: Stable Current medications: citalopram (Celexa) 20 mg by mouth once daily Current non-pharmacologic treatment: unknown Current symptoms include:  unknown Continue citalopram (Celexa) 20 mg by mouth once daily  Patient Goals/Self-Care Activities Patient will:  Take medications as prescribed Collaborate with provider on medication access solutions  Follow Up Plan: Telephone follow up appointment with care management team member scheduled for: 05/25/21      Medication Assistance:  Symbicort obtained through AZ&ME medication assistance program.  Enrollment ends 01/29/22  Patient's preferred pharmacy is:  [place], [place] - [address] [place] [address] Phone: [phone] Fax: [phone]  [facility], [place], [place], [place] [address] Phone: [phone] Fax: [phone]  [facility] - [place], [place] [place] [address] Phone: [phone] Fax: [phone]  Follow Up:  Patient agrees to Care Plan and Follow-up.  Plan: Telephone follow up appointment with care management team member scheduled for:  05/25/21  Kennon Holter, PharmD, [place], [place] Clinical Pharmacist Practitioner [facility] [phone]

## 2021-03-29 DIAGNOSIS — J4540 Moderate persistent asthma, uncomplicated: Secondary | ICD-10-CM

## 2021-03-29 DIAGNOSIS — F339 Major depressive disorder, recurrent, unspecified: Secondary | ICD-10-CM

## 2021-04-10 ENCOUNTER — Other Ambulatory Visit: Payer: Self-pay | Admitting: Nurse Practitioner

## 2021-04-10 DIAGNOSIS — K219 Gastro-esophageal reflux disease without esophagitis: Secondary | ICD-10-CM

## 2021-04-10 DIAGNOSIS — F339 Major depressive disorder, recurrent, unspecified: Secondary | ICD-10-CM

## 2021-04-10 MED ORDER — CITALOPRAM HYDROBROMIDE 20 MG PO TABS: 20.0000 mg | ORAL_TABLET | Freq: Every day | ORAL | 0 refills | Status: DC

## 2021-04-10 MED ORDER — OMEPRAZOLE 40 MG PO CPDR: 40.0000 mg | DELAYED_RELEASE_CAPSULE | Freq: Every day | ORAL | 2 refills | Status: DC

## 2021-04-28 ENCOUNTER — Ambulatory Visit: Payer: Self-pay | Admitting: Neurology

## 2021-05-15 ENCOUNTER — Emergency Department (HOSPITAL_COMMUNITY)
Admission: EM | Admit: 2021-05-15 | Discharge: 2021-05-15 | Disposition: A | Discharge status: Home / Self Care | Payer: Medicare Other | Attending: Emergency Medicine | Admitting: Emergency Medicine

## 2021-05-15 ENCOUNTER — Encounter (HOSPITAL_COMMUNITY): Payer: Self-pay

## 2021-05-15 ENCOUNTER — Other Ambulatory Visit: Payer: Self-pay

## 2021-05-15 ENCOUNTER — Emergency Department (HOSPITAL_COMMUNITY): Payer: Medicare Other

## 2021-05-15 DIAGNOSIS — W01198A Fall on same level from slipping, tripping and stumbling with subsequent striking against other object, initial encounter: Secondary | ICD-10-CM | POA: Diagnosis not present

## 2021-05-15 DIAGNOSIS — W19XXXA Unspecified fall, initial encounter: Secondary | ICD-10-CM

## 2021-05-15 DIAGNOSIS — S0031XA Abrasion of nose, initial encounter: Secondary | ICD-10-CM | POA: Insufficient documentation

## 2021-05-15 DIAGNOSIS — Y92009 Unspecified place in unspecified non-institutional (private) residence as the place of occurrence of the external cause: Secondary | ICD-10-CM | POA: Diagnosis not present

## 2021-05-15 DIAGNOSIS — S0003XA Contusion of scalp, initial encounter: Secondary | ICD-10-CM | POA: Insufficient documentation

## 2021-05-15 DIAGNOSIS — S0990XA Unspecified injury of head, initial encounter: Secondary | ICD-10-CM | POA: Diagnosis present

## 2021-05-15 HISTORY — DX: Chronic obstructive pulmonary disease, unspecified: J44.9

## 2021-05-15 LAB — CBC WITH DIFFERENTIAL/PLATELET
Abs Immature Granulocytes: 0.01 10*3/uL (ref 0.00–0.07)
Basophils Absolute: 0 10*3/uL (ref 0.0–0.1)
Basophils Relative: 1 %
Eosinophils Absolute: 0.1 10*3/uL (ref 0.0–0.5)
Eosinophils Relative: 2 %
HCT: 35.9 % — ABNORMAL LOW (ref 36.0–46.0)
Hemoglobin: 11.8 g/dL — ABNORMAL LOW (ref 12.0–15.0)
Immature Granulocytes: 0 %
Lymphocytes Relative: 38 %
Lymphs Abs: 1.6 10*3/uL (ref 0.7–4.0)
MCH: 30.9 pg (ref 26.0–34.0)
MCHC: 32.9 g/dL (ref 30.0–36.0)
MCV: 94 fL (ref 80.0–100.0)
Monocytes Absolute: 0.5 10*3/uL (ref 0.1–1.0)
Monocytes Relative: 12 %
Neutro Abs: 2.1 10*3/uL (ref 1.7–7.7)
Neutrophils Relative %: 47 %
Platelets: 228 10*3/uL (ref 150–400)
RBC: 3.82 MIL/uL — ABNORMAL LOW (ref 3.87–5.11)
RDW: 13.2 % (ref 11.5–15.5)
WBC: 4.3 10*3/uL (ref 4.0–10.5)
nRBC: 0 % (ref 0.0–0.2)

## 2021-05-15 LAB — COMPREHENSIVE METABOLIC PANEL
ALT: 22 U/L (ref 0–44)
AST: 30 U/L (ref 15–41)
Albumin: 3.8 g/dL (ref 3.5–5.0)
Alkaline Phosphatase: 81 U/L (ref 38–126)
Anion gap: 6 (ref 5–15)
BUN: 17 mg/dL (ref 8–23)
CO2: 28 mmol/L (ref 22–32)
Calcium: 9.6 mg/dL (ref 8.9–10.3)
Chloride: 104 mmol/L (ref 98–111)
Creatinine, Ser: 1.12 mg/dL — ABNORMAL HIGH (ref 0.44–1.00)
GFR, Estimated: 55 mL/min — ABNORMAL LOW (ref >60)
Glucose, Bld: 93 mg/dL (ref 70–99)
Potassium: 3.8 mmol/L (ref 3.5–5.1)
Sodium: 138 mmol/L (ref 135–145)
Total Bilirubin: 0.4 mg/dL (ref 0.3–1.2)
Total Protein: 8.4 g/dL — ABNORMAL HIGH (ref 6.5–8.1)

## 2021-05-15 MED ORDER — LACTATED RINGERS IV BOLUS
500.0000 mL | Freq: Once | INTRAVENOUS | Status: AC
  Administered 2021-05-15: 500 mL via INTRAVENOUS

## 2021-05-15 NOTE — ED Triage Notes (Signed)
Pt arrived from home via POV c/o a fall at home. Pt was found by a family member face down on the ground. Pt presents with large hematoma to anterior head. Pt reports having "shaking spells" at home frequently and has been falling a lot more at home per family at bedside.  ?

## 2021-05-15 NOTE — ED Notes (Signed)
Pt c/o generalized muscle pain from fall at home. Pt concerned she broke her nose. No bleeding noted at this time.  ?

## 2021-05-15 NOTE — ED Notes (Signed)
Pt returned from CT Scan 

## 2021-05-15 NOTE — ED Provider Notes (Signed)
?[place] ?Provider Note ? ? ?CSN: [id number] ?Arrival date & time: 05/15/21  0403 ? ?  ? ?History ? ?Chief Complaint  ?Patient presents with  ? Fall  ? ? ?Grace Cross is a 66 y.o. female. ? ?Patient's had multiple episodes over the last 2 years where she will get short of breath and then shaky and then syncopized.  She has been worked up multiple times for this and family states there is no answer however in the records it does appear that she has been diagnosed with orthostatic hypotension with orthostatics showing blood pressure drops from 0000000 systolic to 0000000 systolic.  Either way this happened again tonight and she hit her head and has an abrasion to the front of her nose.  No chest pain or arm pain or back pain or other pains.  Able to ambulate afterwards.  This is the same episode that she had multiple times before. ? ? ?Fall ?This is a recurrent problem. The current episode started 1 to 2 hours ago. The problem occurs every several days. The problem has not changed since onset.Associated symptoms include headaches and shortness of breath. Pertinent negatives include no chest pain and no abdominal pain.  ? ?  ? ?Home Medications ?Prior to Admission medications   ?Medication Sig Start Date End Date Taking? Authorizing Provider  ?acetaminophen (TYLENOL) 500 MG tablet Take 500 mg by mouth every 6 (six) hours as needed.    [provider]  ?albuterol (PROAIR HFA) 108 (90 Base) MCG/ACT inhaler Inhale 2 puffs into the lungs every 6 (six) hours as needed. For shortness of breath 05/24/20   Noreene Larsson, NP  ?budesonide-formoterol [facility]) 160-4.5 MCG/ACT inhaler Inhale 2 puffs into the lungs in the morning and at bedtime. Rinse your mouth with water and spit after using your inhaler to prevent thrush ?Patient not taking: Reported on 03/23/2021 02/23/21   Renee Rival, FNP  ?citalopram (CELEXA) 20 MG tablet Take 1 tablet (20 mg total) by mouth daily. 04/10/21   Renee Rival, FNP  ?Multiple Vitamins-Minerals (CENTRUM SILVER PO) Take by mouth.    [provider]  ?omeprazole (PRILOSEC) 40 MG capsule Take 1 capsule (40 mg total) by mouth daily. 04/10/21   Renee Rival, FNP  ?   ? ?Allergies    ?Penicillins   ? ?Review of Systems   ?Review of Systems  ?Respiratory:  Positive for shortness of breath.   ?Cardiovascular:  Negative for chest pain.  ?Gastrointestinal:  Negative for abdominal pain.  ?Neurological:  Positive for headaches.  ? ?Physical Exam ?Updated Vital Signs ?BP (!) 144/106   Pulse (!) 101   Temp 97.8 ?F (36.6 ?C) (Oral)   Resp 14   Ht 5\' 1"  (1.549 m)   Wt 47.2 kg   SpO2 93%   BMI 19.65 kg/m?  ?Physical Exam ?Vitals and nursing note reviewed.  ?Constitutional:   ?   Appearance: She is well-developed.  ?HENT:  ?   Head: Normocephalic.  ?   Comments: Hematoma to the mid forehead and abrasion to the bridge of her nose ?   Mouth/Throat:  ?   Mouth: Mucous membranes are moist.  ?   Pharynx: Oropharynx is clear.  ?Eyes:  ?   Pupils: Pupils are equal, round, and reactive to light.  ?Cardiovascular:  ?   Rate and Rhythm: Normal rate and regular rhythm.  ?Pulmonary:  ?   Effort: Pulmonary effort is normal. No respiratory distress.  ?  Breath sounds: No stridor.  ?Abdominal:  ?   General: Abdomen is flat. There is no distension.  ?Musculoskeletal:  ?   Cervical back: Normal range of motion.  ?Skin: ?   General: Skin is warm and dry.  ?Neurological:  ?   General: No focal deficit present.  ?   Mental Status: She is alert.  ?   Cranial Nerves: No cranial nerve deficit.  ?   Motor: No weakness.  ? ? ?ED Results / Procedures / Treatments   ?Labs ?(all labs ordered are listed, but only abnormal results are displayed) ?Labs Reviewed  ?CBC WITH DIFFERENTIAL/PLATELET - Abnormal; Notable for the following components:  ?    Result Value  ? RBC 3.82 (*)   ? Hemoglobin 11.8 (*)   ? HCT 35.9 (*)   ? All other components within normal limits  ?COMPREHENSIVE  METABOLIC PANEL - Abnormal; Notable for the following components:  ? Creatinine, Ser 1.12 (*)   ? Total Protein 8.4 (*)   ? GFR, Estimated 55 (*)   ? All other components within normal limits  ? ? ?EKG ?None ? ?Radiology ?CT Head Wo Contrast ? ?Result Date: 05/15/2021 ?CLINICAL DATA:  66 year old female status post fall at home. Found down. Anterior head hematoma. EXAM: CT HEAD WITHOUT CONTRAST TECHNIQUE: Contiguous axial images were obtained from the base of the skull through the vertex without intravenous contrast. RADIATION DOSE REDUCTION: This exam was performed according to the departmental dose-optimization program which includes automated exposure control, adjustment of the mA and/or kV according to patient size and/or use of iterative reconstruction technique. COMPARISON:  Head CT 02/07/2009. FINDINGS: Brain: Cerebral volume is not significantly changed and remains within normal limits for age. There is a small chronic infarct of the right posterolateral cerebellum which was present in 2011. Unchanged probable small dilated perivascular space in the inferior left frontal lobe white matter near the frontal horn. Elsewhere gray-white matter differentiation is normal for age. No midline shift, ventriculomegaly, mass effect, evidence of mass lesion, intracranial hemorrhage or evidence of cortically based acute infarction. Vascular: Calcified atherosclerosis at the skull base. No suspicious intracranial vascular hyperdensity. Skull: No fracture identified. Sinuses/Orbits: Visualized paranasal sinuses and mastoids are stable and well aerated. Other: Anterior forehead scalp hematoma up to 7 mm in thickness. Underlying frontal bones and frontal sinuses appear intact. No scalp soft tissue gas. Visualized orbit soft tissues are within normal limits. IMPRESSION: 1. Anterior forehead scalp hematoma without underlying skull fracture. 2. No acute intracranial abnormality. Small chronic right cerebellar infarct, stable  since 2011. Electronically Signed   By: Genevie Ann M.D.   On: 05/15/2021 06:58  ? ?CT Cervical Spine Wo Contrast ? ?Result Date: 05/15/2021 ?CLINICAL DATA:  66 year old female status post fall at home. Found down. Anterior head hematoma. EXAM: CT CERVICAL SPINE WITHOUT CONTRAST TECHNIQUE: Multidetector CT imaging of the cervical spine was performed without intravenous contrast. Multiplanar CT image reconstructions were also generated. RADIATION DOSE REDUCTION: This exam was performed according to the departmental dose-optimization program which includes automated exposure control, adjustment of the mA and/or kV according to patient size and/or use of iterative reconstruction technique. COMPARISON:  Head CT today. Cervical spine CT 01/24/2017. FINDINGS: Alignment: Improved cervical lordosis since 2018. Cervicothoracic junction alignment is within normal limits. No acute osseous abnormality identified. Skull base and vertebrae: Mild osteopenia. Visualized skull base is intact. No atlanto-occipital dissociation. C1 and C2 appear intact and aligned. No acute osseous abnormality identified. Soft tissues and spinal canal: No prevertebral  fluid or swelling. No visible canal hematoma. Chronic nodular atrophy of the bilateral parotid glands with scattered sialolithiasis. Similar nodular atrophy of the submandibular glands. No significant change since 2018. Disc levels: Mild for age cervical spine degeneration appears stable since 2018. Upper chest: Centrilobular emphysema. Visible upper thoracic levels appear grossly intact. IMPRESSION: 1. No acute traumatic injury identified in the cervical spine. 2. Emphysema (ICD10-J43.9). 3. Chronic nodular salivary gland atrophy suggesting chronic/recurrent inflammation such as due to Sjogren Syndrome or similar systemic inflammatory process. Electronically Signed   By: Genevie Ann M.D.   On: 05/15/2021 07:00   ? ?Procedures ?Procedures  ? ? ?Medications Ordered in ED ?Medications  ?lactated  ringers bolus 500 mL (0 mLs Intravenous Stopped 05/15/21 0552)  ? ? ?ED Course/ Medical Decision Making/ A&P ?  ?                        ?Medical Decision Making ?Amount and/or Complexity of Data Reviewed ?Labs: ordered. ?Radiolog

## 2021-05-15 NOTE — ED Notes (Signed)
ED Provider at bedside. 

## 2021-05-24 ENCOUNTER — Ambulatory Visit (INDEPENDENT_AMBULATORY_CARE_PROVIDER_SITE_OTHER): Payer: Medicare Other | Admitting: Family Medicine

## 2021-05-24 ENCOUNTER — Encounter: Payer: Self-pay | Admitting: Family Medicine

## 2021-05-24 VITALS — BP 114/68 | HR 89 | Ht 61.0 in | Wt 104.4 lb

## 2021-05-24 DIAGNOSIS — M329 Systemic lupus erythematosus, unspecified: Secondary | ICD-10-CM | POA: Diagnosis not present

## 2021-05-24 DIAGNOSIS — W19XXXA Unspecified fall, initial encounter: Secondary | ICD-10-CM | POA: Diagnosis not present

## 2021-05-24 NOTE — Progress Notes (Signed)
? ?Established Patient Office Visit ? ?Subjective:  ?Patient ID: Grace Cross, female    DOB: [birth year]  Age: 66 y.o. MRN: [id number] ? ?CC:  ?Chief Complaint  ?Patient presents with  ? Follow-up  ?  Seen at the ED on 05/15/2021 due to blacking out and had a fall. Pt is still feeling slight head ache. Has a bruise on left arm, complains of soreness and aching.   ? ? ?HPI ?Grace Cross is a 66 y.o. female with past medical history of asthma, URI with cough and congestion, IBS, Chest pain, and Orthostatic hypertension presents for ER follow-up. The patient had a fall on 05/15/2021, where she hit her head. CT scan of the head and spine showed no acute intracranial abnormality and no acute traumatic injury identified in the cervical spine. ? ?She has not had any falls since she visited the ER. The patient states that she is doing well. The hematoma to the mid-forehead and abrasion has resolved with mild tenderness to the bridge of the nose.  ?The patient is accompanied by a family member who states that she was diagnosed with lupus years ago by Dr. Iona Beard, her neurologist. A referral from Dr. Berdine Addison was sent to Dr. Amil Amen for further evaluation and management of lupus symptoms; however, the rheumatologist never saw the patient, and Dr. Berdine Addison is currently retired. The family would like another referral to see the rheumatologist, Dr. Amil Amen.  ? ? ?Past Medical History:  ?Diagnosis Date  ? Acid reflux   ? Asthma   ? Asthma   ? Phrees[employer] 05/21/2020  ? COPD (chronic obstructive pulmonary disease) ([place])   ? Depression   ? Depression   ? [employer]rees[employer] 05/21/2020  ? Hypertension   ? Hypothyroidism   ? used to take thyroid medication  ? ? ?Past Surgical History:  ?Procedure Laterality Date  ? COLONOSCOPY, ESOPHAGOGASTRODUODENOSCOPY (EGD) AND ESOPHAGEAL DILATION N/A 06/17/2012  ? UUV:OZDG IN CECUM/Mod IH/ESO ring/Gastritis  ? ? ?Family History  ?Problem Relation Age of Onset  ? Asthma Mother   ? Heart attack Father   ? Congestive  Heart Failure Father   ? Diabetes Sister   ? Hyperlipidemia Brother   ? Hypertension Brother   ? Asthma Brother   ? Hypertension Brother   ? Colon cancer Neg Hx   ? Inflammatory bowel disease Neg Hx   ? ? ?Social History  ? ?Socioeconomic History  ? Marital status: Single  ?  Spouse name: Not on file  ? Number of children: 1  ? Years of education: Not on file  ? Highest education level: Not on file  ?Occupational History  ? Occupation: unemployed  ?Tobacco Use  ? Smoking status: Former  ?  Packs/day: 0.50  ?  Years: 22.00  ?  Pack years: 11.00  ?  Types: Cigarettes  ?  Quit date: 01/31/1995  ?  Years since quitting: 26.3  ? Smokeless tobacco: Never  ?Vaping Use  ? Vaping Use: Never used  ?Substance and Sexual Activity  ? Alcohol use: Not Currently  ?  Comment: occ 1-2 beer  ? Drug use: No  ? Sexual activity: Not Currently  ?  Birth control/protection: Post-menopausal  ?Other Topics Concern  ? Not on file  ?Social History Narrative  ? Daughter present with her today  ? ?Social Determinants of Health  ? ?Financial Resource Strain: Low Risk   ? Difficulty of Paying Living Expenses: Not hard at all  ?Food Insecurity: No Food Insecurity  ?  Worried About Charity fundraiser in the Last Year: Never true  ? Ran Out of Food in the Last Year: Never true  ?Transportation Needs: No Transportation Needs  ? Lack of Transportation (Medical): No  ? Lack of Transportation (Non-Medical): No  ?Physical Activity: Insufficiently Active  ? Days of Exercise per Week: 2 days  ? Minutes of Exercise per Session: 20 min  ?Stress: No Stress Concern Present  ? Feeling of Stress : Not at all  ?Social Connections: Moderately Isolated  ? Frequency of Communication with Friends and Family: More than three times a week  ? Frequency of Social Gatherings with Friends and Family: Once a week  ? Attends Religious Services: More than 4 times per year  ? Active Member of Clubs or Organizations: No  ? Attends Archivist Meetings: Never  ? Marital  Status: Never married  ?Intimate Partner Violence: Not At Risk  ? Fear of Current or Ex-Partner: No  ? Emotionally Abused: No  ? Physically Abused: No  ? Sexually Abused: No  ? ? ?Outpatient Medications Prior to Visit  ?Medication Sig Dispense Refill  ? acetaminophen (TYLENOL) 500 MG tablet Take 500 mg by mouth every 6 (six) hours as needed.    ? albuterol (PROAIR HFA) 108 (90 Base) MCG/ACT inhaler Inhale 2 puffs into the lungs every 6 (six) hours as needed. For shortness of breath 3 each 0  ? budesonide-formoterol (SYMBICORT) 160-4.5 MCG/ACT inhaler Inhale 2 puffs into the lungs in the morning and at bedtime. Rinse your mouth with water and spit after using your inhaler to prevent thrush 3 each 3  ? citalopram (CELEXA) 20 MG tablet Take 1 tablet (20 mg total) by mouth daily. 90 tablet 0  ? Multiple Vitamins-Minerals (CENTRUM SILVER PO) Take by mouth.    ? omeprazole (PRILOSEC) 40 MG capsule Take 1 capsule (40 mg total) by mouth daily. 30 capsule 2  ? ?No facility-administered medications prior to visit.  ? ? ?Allergies  ?Allergen Reactions  ? Penicillins Rash  ? ? ?ROS ?Review of Systems  ?Constitutional:  Negative for chills, fatigue and fever.  ?HENT:  Negative for rhinorrhea, sinus pressure, sinus pain and sore throat.   ?Eyes:  Negative for pain, redness and itching.  ?Respiratory:  Positive for shortness of breath (has asthma) and wheezing (has asthma). Negative for chest tightness.   ?Cardiovascular:  Negative for chest pain and palpitations.  ?Gastrointestinal:  Positive for diarrhea. Negative for constipation, nausea and vomiting.  ?Endocrine: Negative for polydipsia, polyphagia and polyuria.  ?Genitourinary:  Negative for frequency and urgency.  ?Musculoskeletal:  Negative for arthralgias, back pain and neck pain.  ?Skin:  Negative for rash and wound.  ?Allergic/Immunologic: Positive for environmental allergies.  ?Neurological:  Positive for light-headedness (sometimes). Negative for dizziness and  numbness.  ?Psychiatric/Behavioral:  Negative for confusion, self-injury and suicidal ideas.   ? ?  ?Objective:  ?  ?Physical Exam ?Constitutional:   ?   General: She is not in acute distress. ?HENT:  ?   Head: Normocephalic and atraumatic.  ?   Right Ear: External ear normal.  ?   Left Ear: External ear normal.  ?   Nose: No congestion or rhinorrhea.  ?Cardiovascular:  ?   Rate and Rhythm: Normal rate and regular rhythm.  ?   Pulses: Normal pulses.  ?   Heart sounds: Normal heart sounds.  ?Pulmonary:  ?   Effort: Pulmonary effort is normal.  ?   Breath sounds: Normal breath sounds.  ?  Musculoskeletal:  ?   Cervical back: Normal range of motion.  ?   Right lower leg: No edema.  ?   Left lower leg: No edema.  ?Skin: ?   General: Skin is warm.  ?   Findings: Ecchymosis (left arm) present. No abrasion (tendernes with palpation at the bridge of the nose).  ?Neurological:  ?   Mental Status: She is alert and oriented to person, place, and time.  ?   Coordination: Romberg sign positive. Finger-Nose-Finger Test abnormal and Heel to Henderson Test abnormal.  ?Psychiatric:  ?   Comments: Normal affect  ? ? ?BP 114/68 (BP Location: Right Arm, Patient Position: Sitting)   Pulse 89   Ht _0  (1.549 m)   Wt 104 lb 6.4 oz (47.4 kg)   SpO2 93%   BMI 19.73 kg/m?  ?Wt Readings from Last 3 Encounters:  ?05/24/21 104 lb 6.4 oz (47.4 kg)  ?05/15/21 104 lb (47.2 kg)  ?01/14/21 99 lb (44.9 kg)  ? ? ?Lab Results  ?Component Value Date  ? TSH 0.718 06/03/2020  ? ?Lab Results  ?Component Value Date  ? WBC 3.6 05/24/2021  ? HGB 10.2 (L) 05/24/2021  ? HCT 31.4 (L) 05/24/2021  ? MCV 95 05/24/2021  ? PLT 307 05/24/2021  ? ?Lab Results  ?Component Value Date  ? NA 142 05/24/2021  ? K 4.9 05/24/2021  ? CO2 25 05/24/2021  ? GLUCOSE 77 05/24/2021  ? BUN 28 (H) 05/24/2021  ? CREATININE 1.43 (H) 05/24/2021  ? BILITOT 0.4 05/15/2021  ? ALKPHOS 81 05/15/2021  ? AST 30 05/15/2021  ? ALT 22 05/15/2021  ? PROT 8.4 (H) 05/15/2021  ? ALBUMIN 3.8 05/15/2021   ? CALCIUM 9.1 05/24/2021  ? ANIONGAP 6 05/15/2021  ? EGFR 41 (L) 05/24/2021  ? ?Lab Results  ?Component Value Date  ? CHOL 154 12/22/2020  ? ?Lab Results  ?Component Value Date  ? HDL 74 12/22/2020  ? ?Lab Results

## 2021-05-24 NOTE — Patient Instructions (Addendum)
I appreciate the opportunity to provide care to you today! ? ?-Please stop by the lab to have your blood drawn ? ? ?-Referrals today-  Rheumatology ( Dr. Dierdre Forth in [place]) ?  ? ? ? ?  ?It was a pleasure to see you and I look forward to continuing to work together on your health and well-being. ?Please do not hesitate to call the office if you need care or have questions about your care. ?  ?Have a wonderful day and week. ?With Gratitude, ?Gilmore Laroche MSN, FNP-BC ? ?

## 2021-05-25 ENCOUNTER — Telehealth: Payer: Medicare Other

## 2021-05-25 DIAGNOSIS — W19XXXA Unspecified fall, initial encounter: Secondary | ICD-10-CM | POA: Insufficient documentation

## 2021-05-25 DIAGNOSIS — M329 Systemic lupus erythematosus, unspecified: Secondary | ICD-10-CM | POA: Insufficient documentation

## 2021-05-25 DIAGNOSIS — R21 Rash and other nonspecific skin eruption: Secondary | ICD-10-CM | POA: Insufficient documentation

## 2021-05-25 LAB — CBC WITH DIFFERENTIAL/PLATELET
Basophils Absolute: 0 10*3/uL (ref 0.0–0.2)
Basos: 1 %
EOS (ABSOLUTE): 0.1 10*3/uL (ref 0.0–0.4)
Eos: 1 %
Hematocrit: 31.4 % — ABNORMAL LOW (ref 34.0–46.6)
Hemoglobin: 10.2 g/dL — ABNORMAL LOW (ref 11.1–15.9)
Immature Grans (Abs): 0 10*3/uL (ref 0.0–0.1)
Immature Granulocytes: 1 %
Lymphocytes Absolute: 1.5 10*3/uL (ref 0.7–3.1)
Lymphs: 42 %
MCH: 30.9 pg (ref 26.6–33.0)
MCHC: 32.5 g/dL (ref 31.5–35.7)
MCV: 95 fL (ref 79–97)
Monocytes Absolute: 0.4 10*3/uL (ref 0.1–0.9)
Monocytes: 12 %
Neutrophils Absolute: 1.6 10*3/uL (ref 1.4–7.0)
Neutrophils: 43 %
Platelets: 307 10*3/uL (ref 150–450)
RBC: 3.3 x10E6/uL — ABNORMAL LOW (ref 3.77–5.28)
RDW: 12.3 % (ref 11.7–15.4)
WBC: 3.6 10*3/uL (ref 3.4–10.8)

## 2021-05-25 LAB — BASIC METABOLIC PANEL
BUN/Creatinine Ratio: 20 (ref 12–28)
BUN: 28 mg/dL — ABNORMAL HIGH (ref 8–27)
CO2: 25 mmol/L (ref 20–29)
Calcium: 9.1 mg/dL (ref 8.7–10.3)
Chloride: 105 mmol/L (ref 96–106)
Creatinine, Ser: 1.43 mg/dL — ABNORMAL HIGH (ref 0.57–1.00)
Glucose: 77 mg/dL (ref 70–99)
Potassium: 4.9 mmol/L (ref 3.5–5.2)
Sodium: 142 mmol/L (ref 134–144)
eGFR: 41 mL/min/{1.73_m2} — ABNORMAL LOW (ref >59)

## 2021-05-25 NOTE — Assessment & Plan Note (Signed)
-  Labs and imaging reviewed ?-No recent falls since discharged from the ED ?-Pending CBC and BMP ?

## 2021-05-25 NOTE — Assessment & Plan Note (Signed)
-  Referral to Rheumatology for further evaluation and treatment ?

## 2021-05-30 NOTE — Progress Notes (Signed)
Please inform the patient that her RBC Hemoglobin and hematocrit are low ? ?Please advise the patient to take OTC iron supplements and increase her fluid intake.  ? ?She can also come in for labs in two weeks to recheck her Blood work.

## 2021-06-07 ENCOUNTER — Telehealth: Payer: Self-pay | Admitting: *Deleted

## 2021-06-07 ENCOUNTER — Telehealth: Payer: Medicare Other

## 2021-06-07 NOTE — Telephone Encounter (Signed)
?  Care Management  ? ?Follow Up Note ? ? ?06/07/2021 ?Name: Grace Cross MRN: [id number] DOB: 10-Feb-1955 ? ? ?Referred by: Donell Beers, FNP ?Reason for referral : Chronic Care Management (Asthma, Lupus) ? ? ?An unsuccessful telephone outreach was attempted today. The patient was referred to the case management team for assistance with care management and care coordination.  ? ?Follow Up Plan: Telephone follow up appointment with care management team member scheduled for: upon care guide rescheduling. ? ?Irving Shows RNC, BSN ?RN Case Manager ?[facility] ?[phone] ? ? ?

## 2021-06-24 ENCOUNTER — Other Ambulatory Visit: Payer: Self-pay | Admitting: Family Medicine

## 2021-06-30 ENCOUNTER — Telehealth: Payer: Self-pay | Admitting: *Deleted

## 2021-06-30 NOTE — Chronic Care Management (AMB) (Signed)
  Chronic Care Management Note  06/30/2021 Name: Grace Cross MRN: [id number] DOB: 07-25-1955  Grace Cross is a 66 y.o. year old female who is a primary care patient of Donell Beers, FNP and is actively engaged with the care management team. I reached out to Grace Cross by phone today to assist with re-scheduling an initial visit with the RN Case Manager  Follow up plan: Telephone appointment with care management team member scheduled for:08/09/21  [facility] Guide, Embedded Care Coordination [facility]  Care Management  Direct Dial: [phone]

## 2021-06-30 NOTE — Chronic Care Management (AMB) (Signed)
  Chronic Care Management Note  06/30/2021 Name: Grace Cross MRN: [id number] DOB: 1955/03/07  Grace Cross is a 66 y.o. year old female who is a primary care patient of Donell Beers, FNP and is actively engaged with the care management team. I reached out to Grace Cross by phone today to assist with re-scheduling an initial visit with the RN Case Manager  Follow up plan: Unsuccessful telephone outreach attempt made. A HIPAA compliant phone message was left for the patient providing contact information and requesting a return call.  The care management team will reach out to the patient again over the next 7 days.  If patient returns call to provider office, please advise to call Embedded Care Management Care Guide Misty Stanley at [phone].  Gwenevere Ghazi  Care Guide, Embedded Care Coordination [facility] Management  Direct Dial: [phone]

## 2021-07-12 ENCOUNTER — Other Ambulatory Visit: Payer: Self-pay | Admitting: Nurse Practitioner

## 2021-07-12 DIAGNOSIS — F339 Major depressive disorder, recurrent, unspecified: Secondary | ICD-10-CM

## 2021-07-15 ENCOUNTER — Ambulatory Visit: Payer: Medicare Other | Admitting: Nurse Practitioner

## 2021-07-17 ENCOUNTER — Other Ambulatory Visit: Payer: Self-pay | Admitting: Family Medicine

## 2021-07-17 DIAGNOSIS — K219 Gastro-esophageal reflux disease without esophagitis: Secondary | ICD-10-CM

## 2021-07-21 ENCOUNTER — Other Ambulatory Visit: Payer: Self-pay | Admitting: Family Medicine

## 2021-07-21 DIAGNOSIS — M328 Other forms of systemic lupus erythematosus: Secondary | ICD-10-CM

## 2021-08-04 ENCOUNTER — Ambulatory Visit: Payer: Medicare Other | Admitting: Nurse Practitioner

## 2021-08-09 ENCOUNTER — Telehealth: Payer: Medicare Other

## 2021-08-09 ENCOUNTER — Telehealth: Payer: Self-pay | Admitting: *Deleted

## 2021-08-09 NOTE — Telephone Encounter (Signed)
  Care Management   Follow Up Note   08/09/2021 Name: Grace Cross MRN: [id number] DOB: 1956-01-23   Referred by: Donell Beers, FNP Reason for referral : Chronic Care Management (Asthma, Lupus)   A second unsuccessful telephone outreach was attempted today. The patient was referred to the case management team for assistance with care management and care coordination.   Follow Up Plan: Telephone follow up appointment with care management team member scheduled for: upon care guide rescheduling.  Irving Shows [facility], BSN RN Case Manager [facility] [phone]

## 2021-08-15 ENCOUNTER — Other Ambulatory Visit: Payer: Self-pay | Admitting: Nurse Practitioner

## 2021-08-15 DIAGNOSIS — K219 Gastro-esophageal reflux disease without esophagitis: Secondary | ICD-10-CM

## 2021-08-15 DIAGNOSIS — F339 Major depressive disorder, recurrent, unspecified: Secondary | ICD-10-CM

## 2021-08-22 ENCOUNTER — Telehealth: Payer: Self-pay | Admitting: *Deleted

## 2021-08-22 NOTE — Chronic Care Management (AMB) (Signed)
  Chronic Care Management Note  08/22/2021 Name: CAMIA DIPINTO MRN: [id number] DOB: October 30, 1955  Valarie Cones is a 66 y.o. year old female who is a primary care patient of Donell Beers, FNP and is actively engaged with the care management team. I reached out to Valarie Cones by phone today to assist with re-scheduling a follow up visit with the RN Case Manager  Follow up plan: Unsuccessful telephone outreach attempt made. A HIPAA compliant phone message was left for the patient providing contact information and requesting a return call. Unable to make contact on outreach attempts x 3. PCP Donell Beers, FNP notified via routed documentation in medical record.  We have been unable to make contact with the patient for follow up. The care management team is available to follow up with the patient after provider conversation with the patient regarding recommendation for care management engagement and subsequent re-referral to the care management team.   [facility] Coordination Care Guide  Direct Dial: [phone]

## 2021-09-20 ENCOUNTER — Other Ambulatory Visit: Payer: Self-pay | Admitting: Nurse Practitioner

## 2021-09-20 DIAGNOSIS — K219 Gastro-esophageal reflux disease without esophagitis: Secondary | ICD-10-CM

## 2021-10-17 ENCOUNTER — Ambulatory Visit (INDEPENDENT_AMBULATORY_CARE_PROVIDER_SITE_OTHER): Payer: Medicare Other | Admitting: Family Medicine

## 2021-10-17 ENCOUNTER — Encounter: Payer: Self-pay | Admitting: Family Medicine

## 2021-10-17 VITALS — BP 124/68 | HR 91 | Ht 61.0 in | Wt 99.1 lb

## 2021-10-17 DIAGNOSIS — B9689 Other specified bacterial agents as the cause of diseases classified elsewhere: Secondary | ICD-10-CM

## 2021-10-17 DIAGNOSIS — R0981 Nasal congestion: Secondary | ICD-10-CM

## 2021-10-17 DIAGNOSIS — J329 Chronic sinusitis, unspecified: Secondary | ICD-10-CM | POA: Diagnosis not present

## 2021-10-17 DIAGNOSIS — R058 Other specified cough: Secondary | ICD-10-CM | POA: Diagnosis not present

## 2021-10-17 MED ORDER — DOXYCYCLINE HYCLATE 100 MG PO TABS: 100.0000 mg | ORAL_TABLET | Freq: Two times a day (BID) | ORAL | 0 refills | Status: AC

## 2021-10-17 MED ORDER — BENZONATATE 100 MG PO CAPS: 100.0000 mg | ORAL_CAPSULE | Freq: Two times a day (BID) | ORAL | 0 refills | Status: DC | PRN

## 2021-10-17 MED ORDER — FLUTICASONE PROPIONATE 50 MCG/ACT NA SUSP: 2.0000 | Freq: Every day | NASAL | 6 refills | Status: DC

## 2021-10-17 NOTE — Patient Instructions (Addendum)
I appreciate the opportunity to provide care to you today!    Follow up:  3 months  Labs: please stop by the lab today to get your blood drawn (CBC)  Please pick up your medications at the pharmacy  Please stop by [facility] hospital anytime to get an x-ray of your chest to r/o pneumonia      Please continue to a heart-healthy diet and increase your physical activities. Try to exercise for 62mins at least three times a week.      It was a pleasure to see you and I look forward to continuing to work together on your health and well-being. Please do not hesitate to call the office if you need care or have questions about your care.   Have a wonderful day and week. With Gratitude, Alvira Monday MSN, FNP-BC

## 2021-10-17 NOTE — Progress Notes (Unsigned)
Established Patient Office Visit  Subjective:  Patient ID: Grace Cross, female    DOB: Mar 02, 1955  Age: 66 y.o. MRN: [id number]  CC:  Chief Complaint  Patient presents with   Cough    Pt c/o sx of cough, aching in her back from cough, headache, chills on and off.     HPI BRIANCA FORTENBERRY is a 66 y.o. female with past medical history of Asthma and SLE presents with c/o of cough. Sinusitis:Onset of symptoms on 10/12/20. She c/o fever, chills, congestion, rhinorrhea, sinus pain, sinus pressure, sneezing, sore throat, chest tightness, cough, SOB, and headaches. She reports taking dayquil and nightquil with minimum relief of symptoms. She c/o pleuritis and denies any sick contacts.   Past Medical History:  Diagnosis Date   Acid reflux    Asthma    Asthma    Phreesia 05/21/2020   COPD (chronic obstructive pulmonary disease) ([place])    Depression    Depression    Phreesia 05/21/2020   Hypertension    Hypothyroidism    used to take thyroid medication    Past Surgical History:  Procedure Laterality Date   COLONOSCOPY, ESOPHAGOGASTRODUODENOSCOPY (EGD) AND ESOPHAGEAL DILATION N/A 06/17/2012   YBW:LSLH IN CECUM/Mod IH/ESO ring/Gastritis    Family History  Problem Relation Age of Onset   Asthma Mother    Heart attack Father    Congestive Heart Failure Father    Diabetes Sister    Hyperlipidemia Brother    Hypertension Brother    Asthma Brother    Hypertension Brother    Colon cancer Neg Hx    Inflammatory bowel disease Neg Hx     Social History   Socioeconomic History   Marital status: Single    Spouse name: Not on file   Number of children: 1   Years of education: Not on file   Highest education level: Not on file  Occupational History   Occupation: unemployed  Tobacco Use   Smoking status: Former    Packs/day: 0.50    Years: 22.00    Total pack years: 11.00    Types: Cigarettes    Quit date: 01/31/1995    Years since quitting: 26.7   Smokeless tobacco: Never   Vaping Use   Vaping Use: Never used  Substance and Sexual Activity   Alcohol use: Not Currently    Comment: occ 1-2 beer   Drug use: No   Sexual activity: Not Currently    Birth control/protection: Post-menopausal  Other Topics Concern   Not on file  Social History Narrative   Daughter present with her today   Social Determinants of Health   Financial Resource Strain: Low Risk  (01/14/2021)   Overall Financial Resource Strain (CARDIA)    Difficulty of Paying Living Expenses: Not hard at all  Food Insecurity: No Food Insecurity (01/14/2021)   Hunger Vital Sign    Worried About Running Out of Food in the Last Year: Never true    [place] in the Last Year: Never true  Transportation Needs: No Transportation Needs (01/14/2021)   PRAPARE - Hydrologist (Medical): No    Lack of Transportation (Non-Medical): No  Physical Activity: Insufficiently Active (01/14/2021)   Exercise Vital Sign    Days of Exercise per Week: 2 days    Minutes of Exercise per Session: 20 min  Stress: No Stress Concern Present (01/14/2021)   [place]  Feeling of Stress : Not at all  Social Connections: Moderately Isolated (01/14/2021)   Social Connection and Isolation Panel [NHANES]    Frequency of Communication with Friends and Family: More than three times a week    Frequency of Social Gatherings with Friends and Family: Once a week    Attends Religious Services: More than 4 times per year    Active Member of [employer] or Organizations: No    Attends Archivist Meetings: Never    Marital Status: Never married  Intimate Partner Violence: Not At Risk (01/14/2021)   Humiliation, Afraid, Rape, and Kick questionnaire    Fear of Current or Ex-Partner: No    Emotionally Abused: No    Physically Abused: No    Sexually Abused: No    Outpatient Medications Prior to Visit  Medication Sig Dispense  Refill   acetaminophen (TYLENOL) 500 MG tablet Take 500 mg by mouth every 6 (six) hours as needed.     albuterol (PROAIR HFA) 108 (90 Base) MCG/ACT inhaler Inhale 2 puffs into the lungs every 6 (six) hours as needed. For shortness of breath 3 each 0   budesonide-formoterol (SYMBICORT) 160-4.5 MCG/ACT inhaler Inhale 2 puffs into the lungs in the morning and at bedtime. Rinse your mouth with water and spit after using your inhaler to prevent thrush 3 each 3   citalopram (CELEXA) 20 MG tablet Take 1 tablet by mouth once daily 90 tablet 0   Multiple Vitamins-Minerals (CENTRUM SILVER PO) Take by mouth.     omeprazole (PRILOSEC) 40 MG capsule Take 1 capsule by mouth once daily 30 capsule 0   No facility-administered medications prior to visit.    Allergies  Allergen Reactions   Penicillins Rash    ROS Review of Systems  Constitutional:  Positive for chills and fatigue. Negative for fever.  HENT:  Positive for congestion, rhinorrhea, sinus pressure, sinus pain, sneezing and sore throat.   Respiratory:  Positive for cough, chest tightness and shortness of breath.   Cardiovascular:  Negative for palpitations.  Gastrointestinal:  Negative for nausea and vomiting.  Neurological:  Positive for headaches. Negative for dizziness.      Objective:    Physical Exam HENT:     Head: Normocephalic.     Nose: Congestion and rhinorrhea present.     Right Sinus: Maxillary sinus tenderness and frontal sinus tenderness present.     Left Sinus: Maxillary sinus tenderness and frontal sinus tenderness present.  Cardiovascular:     Rate and Rhythm: Normal rate and regular rhythm.     Pulses: Normal pulses.     Heart sounds: Normal heart sounds.  Pulmonary:     Comments: crackles auscultated right an left lower lobes Abdominal:     Tenderness: There is no right CVA tenderness or left CVA tenderness.  Musculoskeletal:     Cervical back: No rigidity.  Neurological:     Mental Status: She is alert.      BP 124/68 (BP Location: Left Arm)   Pulse 91   Ht 5' 1"  (1.549 m)   Wt 99 lb 1.3 oz (44.9 kg)   SpO2 91%   BMI 18.72 kg/m  Wt Readings from Last 3 Encounters:  10/17/21 99 lb 1.3 oz (44.9 kg)  05/24/21 104 lb 6.4 oz (47.4 kg)  05/15/21 104 lb (47.2 kg)    Lab Results  Component Value Date   TSH 0.718 06/03/2020   Lab Results  Component Value Date   WBC 4.6 10/17/2021  HGB 10.8 (L) 10/17/2021   HCT 33.0 (L) 10/17/2021   MCV 94 10/17/2021   PLT 263 10/17/2021   Lab Results  Component Value Date   NA 142 05/24/2021   K 4.9 05/24/2021   CO2 25 05/24/2021   GLUCOSE 77 05/24/2021   BUN 28 (H) 05/24/2021   CREATININE 1.43 (H) 05/24/2021   BILITOT 0.4 05/15/2021   ALKPHOS 81 05/15/2021   AST 30 05/15/2021   ALT 22 05/15/2021   PROT 8.4 (H) 05/15/2021   ALBUMIN 3.8 05/15/2021   CALCIUM 9.1 05/24/2021   ANIONGAP 6 05/15/2021   EGFR 41 (L) 05/24/2021   Lab Results  Component Value Date   CHOL 154 12/22/2020   Lab Results  Component Value Date   HDL 74 12/22/2020   Lab Results  Component Value Date   LDLCALC 67 12/22/2020   Lab Results  Component Value Date   TRIG 64 12/22/2020   Lab Results  Component Value Date   CHOLHDL 2.4 11/11/2019   Lab Results  Component Value Date   HGBA1C 5.2 11/11/2019      Assessment & Plan:   Problem List Items Addressed This Visit       Respiratory   Bacterial sinusitis - Primary    Symptoms likely for sinusitis and CAP Will be treated for sinusitis  Given the patient's allergy to penicillin, will be treated with doxycycline 100 mg BID for 5 days Chest x-ray and CBC was ordered to r/o CAP Tessalon peals were ordered for cough, and Flonase for congestion  Encouraged to take otc Tylenol for pain as needed       Relevant Medications   doxycycline (VIBRA-TABS) 100 MG tablet   benzonatate (TESSALON) 100 MG capsule   fluticasone (FLONASE) 50 MCG/ACT nasal spray   Other Relevant Orders   CBC (Completed)    Other Visit Diagnoses     Other cough       Relevant Medications   benzonatate (TESSALON) 100 MG capsule   Other Relevant Orders   DG Chest 2 View   CBC (Completed)   Nasal sinus congestion       Relevant Medications   fluticasone (FLONASE) 50 MCG/ACT nasal spray       Meds ordered this encounter  Medications   doxycycline (VIBRA-TABS) 100 MG tablet    Sig: Take 1 tablet (100 mg total) by mouth 2 (two) times daily for 5 days.    Dispense:  10 tablet    Refill:  0   benzonatate (TESSALON) 100 MG capsule    Sig: Take 1 capsule (100 mg total) by mouth 2 (two) times daily as needed for cough.    Dispense:  20 capsule    Refill:  0   fluticasone (FLONASE) 50 MCG/ACT nasal spray    Sig: Place 2 sprays into both nostrils daily.    Dispense:  16 g    Refill:  6   DISCONTD: azithromycin (ZITHROMAX) 250 MG tablet    Sig: Take 2 tablets on day 1, then 1 tablet daily on days 2 through 5    Dispense:  6 tablet    Refill:  0    Follow-up: Return if symptoms worsen or fail to improve.    Alvira Monday, FNP

## 2021-10-18 DIAGNOSIS — J329 Chronic sinusitis, unspecified: Secondary | ICD-10-CM | POA: Insufficient documentation

## 2021-10-18 DIAGNOSIS — B9689 Other specified bacterial agents as the cause of diseases classified elsewhere: Secondary | ICD-10-CM | POA: Insufficient documentation

## 2021-10-18 LAB — CBC
Hematocrit: 33 % — ABNORMAL LOW (ref 34.0–46.6)
Hemoglobin: 10.8 g/dL — ABNORMAL LOW (ref 11.1–15.9)
MCH: 30.6 pg (ref 26.6–33.0)
MCHC: 32.7 g/dL (ref 31.5–35.7)
MCV: 94 fL (ref 79–97)
Platelets: 263 10*3/uL (ref 150–450)
RBC: 3.53 x10E6/uL — ABNORMAL LOW (ref 3.77–5.28)
RDW: 12.1 % (ref 11.7–15.4)
WBC: 4.6 10*3/uL (ref 3.4–10.8)

## 2021-10-18 MED ORDER — AZITHROMYCIN 250 MG PO TABS: ORAL_TABLET | ORAL | 0 refills | Status: DC

## 2021-10-18 NOTE — Assessment & Plan Note (Addendum)
Symptoms likely for sinusitis and CAP Will be treated for sinusitis  Given the patient's allergy to penicillin, will be treated with doxycycline 100 mg BID for 5 days Chest x-ray and CBC was ordered to r/o CAP Tessalon peals were ordered for cough, and Flonase for congestion  Encouraged to take otc Tylenol for pain as needed

## 2021-10-19 NOTE — Progress Notes (Signed)
Please encourage the patient to increase her intake of iron-rich foods. Her iron levels are slightly low

## 2021-10-20 LAB — CBC WITH DIFFERENTIAL/PLATELET
Basophils Absolute: 0 10*3/uL (ref 0.0–0.2)
Basos: 1 %
EOS (ABSOLUTE): 0.1 10*3/uL (ref 0.0–0.4)
Eos: 2 %
Hematocrit: 33.5 % — ABNORMAL LOW (ref 34.0–46.6)
Hemoglobin: 10.5 g/dL — ABNORMAL LOW (ref 11.1–15.9)
Immature Grans (Abs): 0 10*3/uL (ref 0.0–0.1)
Immature Granulocytes: 0 %
Lymphocytes Absolute: 1.9 10*3/uL (ref 0.7–3.1)
Lymphs: 42 %
MCH: 30.8 pg (ref 26.6–33.0)
MCHC: 31.3 g/dL — ABNORMAL LOW (ref 31.5–35.7)
MCV: 98 fL — ABNORMAL HIGH (ref 79–97)
Monocytes Absolute: 0.6 10*3/uL (ref 0.1–0.9)
Monocytes: 12 %
Neutrophils Absolute: 2 10*3/uL (ref 1.4–7.0)
Neutrophils: 43 %
Platelets: 265 10*3/uL (ref 150–450)
RBC: 3.41 x10E6/uL — ABNORMAL LOW (ref 3.77–5.28)
RDW: 12.5 % (ref 11.7–15.4)
WBC: 4.6 10*3/uL (ref 3.4–10.8)

## 2021-10-20 LAB — SPECIMEN STATUS REPORT

## 2021-10-27 ENCOUNTER — Telehealth: Payer: Self-pay | Admitting: Family Medicine

## 2021-10-27 NOTE — Telephone Encounter (Signed)
Pt called stating she is needing a refill on omeprazole (PRILOSEC) 40 MG capsule. Can you please refill? She is out.

## 2021-10-28 ENCOUNTER — Other Ambulatory Visit: Payer: Self-pay

## 2021-10-28 DIAGNOSIS — K219 Gastro-esophageal reflux disease without esophagitis: Secondary | ICD-10-CM

## 2021-10-28 MED ORDER — OMEPRAZOLE 40 MG PO CPDR: 40.0000 mg | DELAYED_RELEASE_CAPSULE | Freq: Every day | ORAL | 2 refills | Status: DC

## 2021-10-28 NOTE — Telephone Encounter (Signed)
Refill sent.

## 2022-01-16 ENCOUNTER — Encounter: Payer: Medicare Other | Admitting: Internal Medicine

## 2022-01-16 ENCOUNTER — Ambulatory Visit: Payer: Medicare Other | Admitting: Family Medicine

## 2022-01-19 ENCOUNTER — Ambulatory Visit (INDEPENDENT_AMBULATORY_CARE_PROVIDER_SITE_OTHER): Payer: Medicare Other | Admitting: Family Medicine

## 2022-01-19 ENCOUNTER — Encounter: Payer: Self-pay | Admitting: Family Medicine

## 2022-01-19 DIAGNOSIS — Z23 Encounter for immunization: Secondary | ICD-10-CM | POA: Diagnosis not present

## 2022-01-19 DIAGNOSIS — R059 Cough, unspecified: Secondary | ICD-10-CM

## 2022-01-19 DIAGNOSIS — E038 Other specified hypothyroidism: Secondary | ICD-10-CM

## 2022-01-19 DIAGNOSIS — Z1159 Encounter for screening for other viral diseases: Secondary | ICD-10-CM

## 2022-01-19 DIAGNOSIS — R21 Rash and other nonspecific skin eruption: Secondary | ICD-10-CM | POA: Diagnosis not present

## 2022-01-19 DIAGNOSIS — K219 Gastro-esophageal reflux disease without esophagitis: Secondary | ICD-10-CM | POA: Diagnosis not present

## 2022-01-19 DIAGNOSIS — F339 Major depressive disorder, recurrent, unspecified: Secondary | ICD-10-CM

## 2022-01-19 DIAGNOSIS — R7301 Impaired fasting glucose: Secondary | ICD-10-CM

## 2022-01-19 DIAGNOSIS — E559 Vitamin D deficiency, unspecified: Secondary | ICD-10-CM

## 2022-01-19 DIAGNOSIS — E7849 Other hyperlipidemia: Secondary | ICD-10-CM

## 2022-01-19 DIAGNOSIS — Z13820 Encounter for screening for osteoporosis: Secondary | ICD-10-CM

## 2022-01-19 MED ORDER — CITALOPRAM HYDROBROMIDE 20 MG PO TABS: 20.0000 mg | ORAL_TABLET | Freq: Every day | ORAL | 0 refills | Status: DC

## 2022-01-19 MED ORDER — OMEPRAZOLE 40 MG PO CPDR: 40.0000 mg | DELAYED_RELEASE_CAPSULE | Freq: Every day | ORAL | 2 refills | Status: DC

## 2022-01-19 MED ORDER — GUAIFENESIN 100 MG/5ML PO LIQD: 5.0000 mL | ORAL | 0 refills | Status: DC | PRN

## 2022-01-19 NOTE — Progress Notes (Addendum)
Established Patient Office Visit  Subjective:  Patient ID: Grace Cross, female    DOB: 11-22-1955  Age: 66 y.o. MRN: [id number]  CC:  Chief Complaint  Patient presents with   Gastroesophageal Reflux    Patient is here for f/u of GERD. Feels like omeprazole works, no side effects or issues.    Depression    F/u for depression. Takes citalopram, no issues or side effects.    Rash    Patient complains of rash on forearms, legs and R hip starting 12/2 that comes and goes.     HPI Grace Cross is a 66 y.o. female with past medical history of depression GERD presents for f/u of  chronic medical conditions. For the details of today's visit, please refer to the assessment and plan.     Past Medical History:  Diagnosis Date   Acid reflux    Asthma    Asthma    Phreesia 05/21/2020   COPD (chronic obstructive pulmonary disease) ([place])    Depression    Depression    Phreesia 05/21/2020   Hypertension    Hypothyroidism    used to take thyroid medication    Past Surgical History:  Procedure Laterality Date   COLONOSCOPY, ESOPHAGOGASTRODUODENOSCOPY (EGD) AND ESOPHAGEAL DILATION N/A 06/17/2012   HWE:XHBZ IN CECUM/Mod IH/ESO ring/Gastritis    Family History  Problem Relation Age of Onset   Asthma Mother    Heart attack Father    Congestive Heart Failure Father    Diabetes Sister    Hyperlipidemia Brother    Hypertension Brother    Asthma Brother    Hypertension Brother    Colon cancer Neg Hx    Inflammatory bowel disease Neg Hx     Social History   Socioeconomic History   Marital status: Single    Spouse name: Not on file   Number of children: 1   Years of education: Not on file   Highest education level: Not on file  Occupational History   Occupation: unemployed  Tobacco Use   Smoking status: Former    Packs/day: 0.50    Years: 22.00    Total pack years: 11.00    Types: Cigarettes    Quit date: 01/31/1995    Years since quitting: 26.9   Smokeless tobacco:  Never  Vaping Use   Vaping Use: Never used  Substance and Sexual Activity   Alcohol use: Not Currently    Comment: occ 1-2 beer   Drug use: No   Sexual activity: Not Currently    Birth control/protection: Post-menopausal  Other Topics Concern   Not on file  Social History Narrative   Daughter present with her today   Social Determinants of Health   Financial Resource Strain: Low Risk  (01/14/2021)   Overall Financial Resource Strain (CARDIA)    Difficulty of Paying Living Expenses: Not hard at all  Food Insecurity: No Food Insecurity (01/14/2021)   Hunger Vital Sign    Worried About Running Out of Food in the Last Year: Never true    [place] in the Last Year: Never true  Transportation Needs: No Transportation Needs (01/14/2021)   PRAPARE - Hydrologist (Medical): No    Lack of Transportation (Non-Medical): No  Physical Activity: Insufficiently Active (01/14/2021)   Exercise Vital Sign    Days of Exercise per Week: 2 days    Minutes of Exercise per Session: 20 min  Stress: No Stress Concern Present (  01/14/2021)   [place]    Feeling of Stress : Not at all  Social Connections: Moderately Isolated (01/14/2021)   Social Connection and Isolation Panel [NHANES]    Frequency of Communication with Friends and Family: More than three times a week    Frequency of Social Gatherings with Friends and Family: Once a week    Attends Religious Services: More than 4 times per year    Active Member of [employer] or Organizations: No    Attends Archivist Meetings: Never    Marital Status: Never married  Intimate Partner Violence: Not At Risk (01/14/2021)   Humiliation, Afraid, Rape, and Kick questionnaire    Fear of Current or Ex-Partner: No    Emotionally Abused: No    Physically Abused: No    Sexually Abused: No    Outpatient Medications Prior to Visit  Medication Sig  Dispense Refill   acetaminophen (TYLENOL) 500 MG tablet Take 500 mg by mouth every 6 (six) hours as needed.     albuterol (PROAIR HFA) 108 (90 Base) MCG/ACT inhaler Inhale 2 puffs into the lungs every 6 (six) hours as needed. For shortness of breath 3 each 0   budesonide-formoterol (SYMBICORT) 160-4.5 MCG/ACT inhaler Inhale 2 puffs into the lungs in the morning and at bedtime. Rinse your mouth with water and spit after using your inhaler to prevent thrush 3 each 3   fluticasone (FLONASE) 50 MCG/ACT nasal spray Place 2 sprays into both nostrils daily. 16 g 6   Multiple Vitamins-Minerals (CENTRUM SILVER PO) Take by mouth.     citalopram (CELEXA) 20 MG tablet Take 1 tablet by mouth once daily 90 tablet 0   omeprazole (PRILOSEC) 40 MG capsule Take 1 capsule (40 mg total) by mouth daily. 30 capsule 2   benzonatate (TESSALON) 100 MG capsule Take 1 capsule (100 mg total) by mouth 2 (two) times daily as needed for cough. 20 capsule 0   No facility-administered medications prior to visit.    Allergies  Allergen Reactions   Penicillins Rash    ROS Review of Systems  Constitutional:  Negative for chills and fever.  Eyes:  Negative for visual disturbance.  Respiratory:  Negative for chest tightness and shortness of breath.   Skin:  Positive for rash.  Neurological:  Negative for dizziness and headaches.      Objective:    Physical Exam HENT:     Head: Normocephalic.     Mouth/Throat:     Mouth: Mucous membranes are moist.  Cardiovascular:     Rate and Rhythm: Normal rate.     Heart sounds: Normal heart sounds.  Pulmonary:     Effort: Pulmonary effort is normal.     Breath sounds: Normal breath sounds.  Skin:    Findings: No lesion or rash.  Neurological:     Mental Status: She is alert.     BP 113/71   Pulse 95   Ht _0  (1.549 m)   Wt 98 lb (44.5 kg)   SpO2 90%   BMI 18.52 kg/m  Wt Readings from Last 3 Encounters:  01/19/22 98 lb (44.5 kg)  10/17/21 99 lb 1.3 oz (44.9  kg)  05/24/21 104 lb 6.4 oz (47.4 kg)    Lab Results  Component Value Date   TSH 0.718 06/03/2020   Lab Results  Component Value Date   WBC 4.6 10/17/2021   WBC 4.6 10/17/2021   HGB 10.8 (L) 10/17/2021  HGB 10.5 (L) 10/17/2021   HCT 33.0 (L) 10/17/2021   HCT 33.5 (L) 10/17/2021   MCV 94 10/17/2021   MCV 98 (H) 10/17/2021   PLT 263 10/17/2021   PLT 265 10/17/2021   Lab Results  Component Value Date   NA 142 05/24/2021   K 4.9 05/24/2021   CO2 25 05/24/2021   GLUCOSE 77 05/24/2021   BUN 28 (H) 05/24/2021   CREATININE 1.43 (H) 05/24/2021   BILITOT 0.4 05/15/2021   ALKPHOS 81 05/15/2021   AST 30 05/15/2021   ALT 22 05/15/2021   PROT 8.4 (H) 05/15/2021   ALBUMIN 3.8 05/15/2021   CALCIUM 9.1 05/24/2021   ANIONGAP 6 05/15/2021   EGFR 41 (L) 05/24/2021   Lab Results  Component Value Date   CHOL 154 12/22/2020   Lab Results  Component Value Date   HDL 74 12/22/2020   Lab Results  Component Value Date   LDLCALC 67 12/22/2020   Lab Results  Component Value Date   TRIG 64 12/22/2020   Lab Results  Component Value Date   CHOLHDL 2.4 11/11/2019   Lab Results  Component Value Date   HGBA1C 5.2 11/11/2019      Assessment & Plan:  Depression, recurrent ([place]) Assessment & Plan: She takes Celexa 20 mg daily Denies suicidal ideation and thoughts Encouraged to continue treatment regimen  Orders: -     Citalopram Hydrobromide; Take 1 tablet (20 mg total) by mouth daily.  Dispense: 90 tablet; Refill: 0  Gastroesophageal reflux disease, unspecified whether esophagitis present Assessment & Plan: She takes omeprazole 40 mg daily She reports relief of her symptoms with current treatment regimen and dosage Refilled omeprazole 40 mg  Orders: -     Omeprazole; Take 1 capsule (40 mg total) by mouth daily.  Dispense: 30 capsule; Refill: 2  Rash Assessment & Plan: She complains of a rash on her forearm, legs, and right hip that waxes and wanes Patient attributes  the rash to symptoms of lupus Low suspicion that the patient has lupus No evidence of rash was noted on her skin today Encourage patient to make an appointment when she has another flareup    IFG (impaired fasting glucose) -     Hemoglobin A1c  Vitamin D deficiency -     VITAMIN D 25 Hydroxy (Vit-D Deficiency, Fractures)  Need for hepatitis C screening test  Other specified hypothyroidism -     TSH + free T4  Other hyperlipidemia -     Lipid panel -     CMP14+EGFR -     CBC with Differential/Platelet  Encounter for screening for osteoporosis  Cough in adult -     guaiFENesin; Take 5 mLs by mouth every 4 (four) hours as needed for cough or to loosen phlegm.  Dispense: 120 mL; Refill: 0  Need for influenza vaccination -     Flu Vaccine QUAD High Dose(Fluad)    Follow-up: Return in about 3 months (around 04/20/2022).   Alvira Monday, FNP

## 2022-01-19 NOTE — Assessment & Plan Note (Addendum)
She complains of a rash on her forearm, legs, and right hip that waxes and wanes Patient attributes the rash to symptoms of lupus Low suspicion that the patient has lupus No evidence of rash was noted on her skin today Encourage patient to make an appointment when she has another flareup

## 2022-01-19 NOTE — Assessment & Plan Note (Signed)
She takes omeprazole 40 mg daily She reports relief of her symptoms with current treatment regimen and dosage Refilled omeprazole 40 mg

## 2022-01-19 NOTE — Assessment & Plan Note (Signed)
She takes Celexa 20 mg daily Denies suicidal ideation and thoughts Encouraged to continue treatment regimen

## 2022-01-19 NOTE — Patient Instructions (Signed)
I appreciate the opportunity to provide care to you today!    Follow up:  3 months  Fasting Labs: please stop by the lab during the week  to get your blood drawn (CBC, CMP, TSH, Lipid profile, HgA1c, Vit D)  Please pick up your refills at the pharmacy    Please continue to a heart-healthy diet and increase your physical activities. Try to exercise for at least three times a week.      It was a pleasure to see you and I look forward to continuing to work together on your health and well-being. Please do not hesitate to call the office if you need care or have questions about your care.   Have a wonderful day and week. With Gratitude, Gilmore Laroche MSN, FNP-BC

## 2022-01-25 ENCOUNTER — Encounter: Payer: Medicare Other | Admitting: Family Medicine

## 2022-03-01 ENCOUNTER — Other Ambulatory Visit: Payer: Self-pay | Admitting: Family Medicine

## 2022-03-01 DIAGNOSIS — F339 Major depressive disorder, recurrent, unspecified: Secondary | ICD-10-CM

## 2022-03-13 ENCOUNTER — Emergency Department (HOSPITAL_COMMUNITY): Payer: Medicare Other

## 2022-03-13 ENCOUNTER — Other Ambulatory Visit: Payer: Self-pay

## 2022-03-13 ENCOUNTER — Ambulatory Visit
Admission: RE | Admit: 2022-03-13 | Discharge: 2022-03-13 | Disposition: A | Discharge status: Home / Self Care | Payer: Medicare Other | Source: Ambulatory Visit | Attending: Family Medicine | Admitting: Family Medicine

## 2022-03-13 ENCOUNTER — Encounter (HOSPITAL_COMMUNITY): Payer: Self-pay | Admitting: Emergency Medicine

## 2022-03-13 ENCOUNTER — Emergency Department (HOSPITAL_COMMUNITY)
Admission: EM | Admit: 2022-03-13 | Discharge: 2022-03-14 | Disposition: A | Discharge status: Home / Self Care | Payer: Medicare Other | Attending: Emergency Medicine | Admitting: Emergency Medicine

## 2022-03-13 VITALS — BP 87/57 | HR 101 | Temp 99.0°F | Resp 19

## 2022-03-13 DIAGNOSIS — R000 Tachycardia, unspecified: Secondary | ICD-10-CM

## 2022-03-13 DIAGNOSIS — I959 Hypotension, unspecified: Secondary | ICD-10-CM

## 2022-03-13 DIAGNOSIS — Z79899 Other long term (current) drug therapy: Secondary | ICD-10-CM | POA: Insufficient documentation

## 2022-03-13 DIAGNOSIS — J45909 Unspecified asthma, uncomplicated: Secondary | ICD-10-CM | POA: Insufficient documentation

## 2022-03-13 DIAGNOSIS — K2900 Acute gastritis without bleeding: Secondary | ICD-10-CM | POA: Diagnosis not present

## 2022-03-13 DIAGNOSIS — J9601 Acute respiratory failure with hypoxia: Secondary | ICD-10-CM

## 2022-03-13 DIAGNOSIS — N3090 Cystitis, unspecified without hematuria: Secondary | ICD-10-CM | POA: Insufficient documentation

## 2022-03-13 DIAGNOSIS — J449 Chronic obstructive pulmonary disease, unspecified: Secondary | ICD-10-CM | POA: Diagnosis not present

## 2022-03-13 DIAGNOSIS — R109 Unspecified abdominal pain: Secondary | ICD-10-CM | POA: Diagnosis present

## 2022-03-13 DIAGNOSIS — K219 Gastro-esophageal reflux disease without esophagitis: Secondary | ICD-10-CM

## 2022-03-13 DIAGNOSIS — I10 Essential (primary) hypertension: Secondary | ICD-10-CM | POA: Diagnosis not present

## 2022-03-13 LAB — LIPASE, BLOOD: Lipase: 59 U/L — ABNORMAL HIGH (ref 11–51)

## 2022-03-13 LAB — URINALYSIS, ROUTINE W REFLEX MICROSCOPIC
Bilirubin Urine: NEGATIVE
Glucose, UA: NEGATIVE mg/dL
Ketones, ur: NEGATIVE mg/dL
Nitrite: POSITIVE — AB
Protein, ur: NEGATIVE mg/dL
Specific Gravity, Urine: 1.004 — ABNORMAL LOW (ref 1.005–1.030)
WBC, UA: >50 WBC/hpf (ref 0–5)
pH: 6 (ref 5.0–8.0)

## 2022-03-13 LAB — BRAIN NATRIURETIC PEPTIDE: B Natriuretic Peptide: 15 pg/mL (ref 0.0–100.0)

## 2022-03-13 LAB — RAPID URINE DRUG SCREEN, HOSP PERFORMED
Amphetamines: NOT DETECTED
Barbiturates: NOT DETECTED
Benzodiazepines: NOT DETECTED
Cocaine: NOT DETECTED
Opiates: NOT DETECTED
Tetrahydrocannabinol: NOT DETECTED

## 2022-03-13 LAB — CBC
HCT: 34.8 % — ABNORMAL LOW (ref 36.0–46.0)
Hemoglobin: 11.2 g/dL — ABNORMAL LOW (ref 12.0–15.0)
MCH: 30.5 pg (ref 26.0–34.0)
MCHC: 32.2 g/dL (ref 30.0–36.0)
MCV: 94.8 fL (ref 80.0–100.0)
Platelets: 244 10*3/uL (ref 150–400)
RBC: 3.67 MIL/uL — ABNORMAL LOW (ref 3.87–5.11)
RDW: 13.2 % (ref 11.5–15.5)
WBC: 4.8 10*3/uL (ref 4.0–10.5)
nRBC: 0 % (ref 0.0–0.2)

## 2022-03-13 LAB — MAGNESIUM: Magnesium: 1.4 mg/dL — ABNORMAL LOW (ref 1.7–2.4)

## 2022-03-13 LAB — COMPREHENSIVE METABOLIC PANEL
ALT: 17 U/L (ref 0–44)
AST: 27 U/L (ref 15–41)
Albumin: 3.3 g/dL — ABNORMAL LOW (ref 3.5–5.0)
Alkaline Phosphatase: 90 U/L (ref 38–126)
Anion gap: 8 (ref 5–15)
BUN: 24 mg/dL — ABNORMAL HIGH (ref 8–23)
CO2: 24 mmol/L (ref 22–32)
Calcium: 8.7 mg/dL — ABNORMAL LOW (ref 8.9–10.3)
Chloride: 102 mmol/L (ref 98–111)
Creatinine, Ser: 1.39 mg/dL — ABNORMAL HIGH (ref 0.44–1.00)
GFR, Estimated: 42 mL/min — ABNORMAL LOW (ref >60)
Glucose, Bld: 108 mg/dL — ABNORMAL HIGH (ref 70–99)
Potassium: 3.9 mmol/L (ref 3.5–5.1)
Sodium: 134 mmol/L — ABNORMAL LOW (ref 135–145)
Total Bilirubin: 0.2 mg/dL — ABNORMAL LOW (ref 0.3–1.2)
Total Protein: 7.8 g/dL (ref 6.5–8.1)

## 2022-03-13 LAB — TROPONIN I (HIGH SENSITIVITY): Troponin I (High Sensitivity): 2 ng/L (ref <18)

## 2022-03-13 MED ORDER — LIDOCAINE VISCOUS HCL 2 % MT SOLN
15.0000 mL | Freq: Once | OROMUCOSAL | Status: AC
  Administered 2022-03-13: 15 mL via ORAL
  Filled 2022-03-13: qty 15

## 2022-03-13 MED ORDER — FUROSEMIDE 10 MG/ML IJ SOLN
20.0000 mg | Freq: Once | INTRAMUSCULAR | Status: AC
  Administered 2022-03-13: 20 mg via INTRAVENOUS
  Filled 2022-03-13: qty 2

## 2022-03-13 MED ORDER — NITROGLYCERIN IN D5W 200-5 MCG/ML-% IV SOLN
5.0000 ug/min | INTRAVENOUS | Status: DC
  Filled 2022-03-13: qty 250

## 2022-03-13 MED ORDER — IOHEXOL 350 MG/ML SOLN
75.0000 mL | Freq: Once | INTRAVENOUS | Status: AC | PRN
  Administered 2022-03-13: 75 mL via INTRAVENOUS

## 2022-03-13 MED ORDER — ALUM & MAG HYDROXIDE-SIMETH 200-200-20 MG/5ML PO SUSP
30.0000 mL | Freq: Once | ORAL | Status: AC
  Administered 2022-03-13: 30 mL via ORAL
  Filled 2022-03-13: qty 30

## 2022-03-13 MED ORDER — SODIUM CHLORIDE 0.9 % IV SOLN
1.0000 g | Freq: Once | INTRAVENOUS | Status: AC
  Administered 2022-03-13: 1 g via INTRAVENOUS
  Filled 2022-03-13: qty 10

## 2022-03-13 MED ORDER — FENTANYL CITRATE PF 50 MCG/ML IJ SOSY
50.0000 ug | PREFILLED_SYRINGE | Freq: Once | INTRAMUSCULAR | Status: AC
  Administered 2022-03-13: 50 ug via INTRAVENOUS
  Filled 2022-03-13: qty 1

## 2022-03-13 MED ORDER — FAMOTIDINE IN NACL 20-0.9 MG/50ML-% IV SOLN
20.0000 mg | Freq: Once | INTRAVENOUS | Status: AC
  Administered 2022-03-13: 20 mg via INTRAVENOUS
  Filled 2022-03-13: qty 50

## 2022-03-13 MED ORDER — LACTATED RINGERS IV BOLUS
1000.0000 mL | Freq: Once | INTRAVENOUS | Status: AC
  Administered 2022-03-13: 1000 mL via INTRAVENOUS

## 2022-03-13 NOTE — ED Notes (Signed)
Called 911 to cancel as pt and family member refused to go by EMS.

## 2022-03-13 NOTE — ED Notes (Signed)
Patient is being discharged from the Urgent Care and sent to the Emergency Department via [place] . Per Maricela Bo., patient is in need of higher level of care due to low oxygen level and BP with muscle cramps. Patient is aware and verbalizes understanding of plan of care.  Vitals:   03/13/22 1814 03/13/22 1815  BP: (!) 71/44 (!) 87/57  Pulse: (!) 101   Resp: 19   Temp: 99 F (37.2 C)   SpO2: (!) 85%

## 2022-03-13 NOTE — ED Notes (Signed)
Per provider recommended patient to go to hospital by ambulance, patient and patient daughter refused ambulance and wanted to take PMV to hospital.

## 2022-03-13 NOTE — ED Triage Notes (Signed)
Pt via EMS from home c/o RUQ/epigastric pain since last night. Pt was seen earlier today at urgent care and was discharged, but her pain has worsened. HTN noted 200/100 en route, tachy at 115 bpm, and 84% on room air on scene. Pt appears uncomfortable in triage. Pt says she has had diarrhea today. Pt on 2L on arrival with O2 saturation 95%.

## 2022-03-13 NOTE — ED Triage Notes (Signed)
Patient daughter states this morning patient was having pain in her sides states like muscle cramps. Called ambulance and there were going to take her to the hospital but says they were full. Pain started to go away but came again 4 hours later so brought her here. Patient vitals BP 87/57 oxygen level 86-90. Provider notified.

## 2022-03-13 NOTE — ED Provider Notes (Signed)
Patient presents urgent care today with daughter for cramps in her right side.  Vital signs in triage were obtained and oxygen nation is in the mid 80s on room air, patient is also hypotensive and tachycardic.  Patient's daughter/caregiver states this is normal for her especially when she moves around quickly.  Reports she is most concerned about the abdominal cramping today.  Reports history of COPD, has been giving inhalers as prescribed.  Daughter reports EMS was called out to the home and refused to take her mother to the hospital because of the wait at the hospital.  In triage, patient is in no acute distress, is responsive.  She does appear older than stated age, pale, and thin.  I recommended transportation to nearest emergency room via EMS.  Patient's daughter adamantly declines this and states she will take her to a hospital in [place].  EMS initially was activated, however again patient's daughter adamantly declines transportation via EMS.  Discussed at length with patient's daughter risks of transporting via private vehicle to hospital given unstable vital signs.  Patient's daughter continues to states she will take her to a hospital in [place] herself.  Patient left urgent care responsive and in no acute distress.   Eulogio Bear, NP 03/13/22 [phone]

## 2022-03-13 NOTE — ED Provider Notes (Signed)
[facility] Provider Note   CSN: [id number] Arrival date & time: 03/13/22  1842     History {Add pertinent medical, surgical, social history, OB history to HPI:1} Chief Complaint  Patient presents with   Abdominal Pain    Grace Cross is a 67 y.o. female.   Abdominal Pain Patient presents for abdominal pain.  Medical history includes asthma, COPD, depression, HTN.  Onset of abdominal pain that was last night.  At time of onset, she was laying in bed.  She coughed and felt a pain located in her lower right abdomen.  This pain migrated to upper right abdomen and subxiphoid area.  Pain persisted throughout the night.  Earlier today, she did have 4 hours where the pain resolved.  When it returned, she went to urgent care.  At urgent care, she was noted to be hypertensive, tachycardic, and hypoxic.  Urgent care reports a 4% SpO2 on room air.  Patient denies any associated shortness of breath.  Currently, her abdominal pain has improved.  She has some mild right-sided abdominal soreness.  She last had a bowel movement this morning.  She has not had any nausea or vomiting today.      Home Medications Prior to Admission medications   Medication Sig Start Date End Date Taking? Authorizing Provider  acetaminophen (TYLENOL) 500 MG tablet Take 500 mg by mouth every 6 (six) hours as needed.    [provider]  albuterol (PROAIR HFA) 108 (90 Base) MCG/ACT inhaler Inhale 2 puffs into the lungs every 6 (six) hours as needed. For shortness of breath 05/24/20   Noreene Larsson, NP  benzonatate (TESSALON) 100 MG capsule Take 1 capsule (100 mg total) by mouth 2 (two) times daily as needed for cough. 10/17/21   Alvira Monday, FNP  budesonide-formoterol (SYMBICORT) 160-4.5 MCG/ACT inhaler Inhale 2 puffs into the lungs in the morning and at bedtime. Rinse your mouth with water and spit after using your inhaler to prevent thrush 02/23/21   [place], Dewaine Conger, FNP  citalopram (CELEXA) 20 MG tablet Take 1 tablet (20 mg total) by mouth daily. 01/19/22   Alvira Monday, FNP  fluticasone (FLONASE) 50 MCG/ACT nasal spray Place 2 sprays into both nostrils daily. 10/17/21   Alvira Monday, FNP  guaiFENesin (ROBITUSSIN) 100 MG/5ML liquid Take 5 mLs by mouth every 4 (four) hours as needed for cough or to loosen phlegm. 01/19/22   Alvira Monday, FNP  Multiple Vitamins-Minerals (CENTRUM SILVER PO) Take by mouth.    [provider]  omeprazole (PRILOSEC) 40 MG capsule Take 1 capsule (40 mg total) by mouth daily. 01/19/22 04/19/22  Alvira Monday, FNP      Allergies    Penicillins    Review of Systems   Review of Systems  Gastrointestinal:  Positive for abdominal pain.  All other systems reviewed and are negative.   Physical Exam Updated Vital Signs BP (!) 175/120   Pulse (!) 110   Temp 99.1 F (37.3 C) (Oral)   Resp (!) 22   Ht 5\' 1"  (1.549 m)   Wt 44.5 kg   SpO2 95%   BMI 18.52 kg/m  Physical Exam Vitals and nursing note reviewed.  Constitutional:      General: She is not in acute distress.    Appearance: She is well-developed. She is not ill-appearing, toxic-appearing or diaphoretic.  HENT:     Head: Normocephalic and atraumatic.     Mouth/Throat:  Mouth: Mucous membranes are moist.  Eyes:     Conjunctiva/sclera: Conjunctivae normal.  Cardiovascular:     Rate and Rhythm: Normal rate and regular rhythm.     Heart sounds: No murmur heard. Pulmonary:     Effort: Pulmonary effort is normal. No respiratory distress.     Breath sounds: Normal breath sounds. No wheezing, rhonchi or rales.  Chest:     Chest wall: No tenderness.  Abdominal:     Palpations: Abdomen is soft.     Tenderness: There is generalized abdominal tenderness.  Musculoskeletal:        General: No swelling.     Cervical back: Neck supple.  Skin:    General: Skin is warm and dry.     Capillary Refill: Capillary refill takes less than 2 seconds.      Coloration: Skin is not cyanotic or jaundiced.  Neurological:     General: No focal deficit present.     Mental Status: She is alert and oriented to person, place, and time.  Psychiatric:        Mood and Affect: Mood normal.        Behavior: Behavior normal.     ED Results / Procedures / Treatments   Labs (all labs ordered are listed, but only abnormal results are displayed) Labs Reviewed  LIPASE, BLOOD  COMPREHENSIVE METABOLIC PANEL  CBC  URINALYSIS, ROUTINE W REFLEX MICROSCOPIC    EKG None  Radiology No results found.  Procedures Procedures  {Document cardiac monitor, telemetry assessment procedure when appropriate:1}  Medications Ordered in ED Medications - No data to display  ED Course/ Medical Decision Making/ A&P   {   Click here for ABCD2, HEART and other calculatorsREFRESH Note before signing :1}                          Medical Decision Making Amount and/or Complexity of Data Reviewed Labs: ordered.   This patient presents to the ED for concern of ***, this involves an extensive number of treatment options, and is a complaint that carries with it a high risk of complications and morbidity.  The differential diagnosis includes ***   Co morbidities that complicate the patient evaluation  ***   Additional history obtained:  Additional history obtained from *** External records from outside source obtained and reviewed including ***   Lab Tests:  I Ordered, and personally interpreted labs.  The pertinent results include:  ***   Imaging Studies ordered:  I ordered imaging studies including ***  I independently visualized and interpreted imaging which showed *** I agree with the radiologist interpretation   Cardiac Monitoring: / EKG:  The patient was maintained on a cardiac monitor.  I personally viewed and interpreted the cardiac monitored which showed an underlying rhythm of: ***   Consultations Obtained:  I requested consultation with  the ***,  and discussed lab and imaging findings as well as pertinent plan - they recommend: ***   Problem List / ED Course / Critical interventions / Medication management  *** I ordered medication including ***  for ***  Reevaluation of the patient after these medicines showed that the patient {resolved/improved/worsened:23923::"improved"} I have reviewed the patients home medicines and have made adjustments as needed   Social Determinants of Health:  ***   Test / Admission - Considered:  ***   {Document critical care time when appropriate:1} {Document review of labs and clinical decision tools ie heart score, Chads2Vasc2 etc:1}  {  Document your independent review of radiology images, and any outside records:1} {Document your discussion with family members, caretakers, and with consultants:1} {Document social determinants of health affecting pt's care:1} {Document your decision making why or why not admission, treatments were needed:1} Final Clinical Impression(s) / ED Diagnoses Final diagnoses:  None    Rx / DC Orders ED Discharge Orders     None

## 2022-03-13 NOTE — Discharge Instructions (Signed)
Please go to ED for further evaluation and management of symptoms.

## 2022-03-13 NOTE — ED Notes (Signed)
Called 911, emergency transport, cardiac monitor and oxygen requested.

## 2022-03-14 ENCOUNTER — Ambulatory Visit (INDEPENDENT_AMBULATORY_CARE_PROVIDER_SITE_OTHER): Payer: Medicare Other | Admitting: Internal Medicine

## 2022-03-14 ENCOUNTER — Encounter: Payer: Self-pay | Admitting: Internal Medicine

## 2022-03-14 VITALS — BP 70/52 | HR 80 | Resp 17 | Ht 61.0 in | Wt 99.0 lb

## 2022-03-14 DIAGNOSIS — J441 Chronic obstructive pulmonary disease with (acute) exacerbation: Secondary | ICD-10-CM | POA: Diagnosis not present

## 2022-03-14 LAB — TROPONIN I (HIGH SENSITIVITY): Troponin I (High Sensitivity): 4 ng/L (ref <18)

## 2022-03-14 LAB — TSH: TSH: 1.176 u[IU]/mL (ref 0.350–4.500)

## 2022-03-14 MED ORDER — MAGNESIUM SULFATE 2 GM/50ML IV SOLN
2.0000 g | Freq: Once | INTRAVENOUS | Status: AC
  Administered 2022-03-14: 2 g via INTRAVENOUS
  Filled 2022-03-14: qty 50

## 2022-03-14 MED ORDER — TRELEGY ELLIPTA 100-62.5-25 MCG/ACT IN AEPB: 1.0000 | INHALATION_SPRAY | Freq: Every day | RESPIRATORY_TRACT | 0 refills | Status: DC

## 2022-03-14 MED ORDER — OMEPRAZOLE 40 MG PO CPDR: 40.0000 mg | DELAYED_RELEASE_CAPSULE | Freq: Every day | ORAL | 2 refills | Status: DC

## 2022-03-14 MED ORDER — CEPHALEXIN 250 MG PO CAPS: 250.0000 mg | ORAL_CAPSULE | Freq: Four times a day (QID) | ORAL | 0 refills | Status: AC

## 2022-03-14 MED ORDER — PREDNISONE 20 MG PO TABS: 40.0000 mg | ORAL_TABLET | Freq: Every day | ORAL | 0 refills | Status: AC

## 2022-03-14 MED ORDER — SUCRALFATE 1 GM/10ML PO SUSP: 1.0000 g | Freq: Three times a day (TID) | ORAL | 0 refills | Status: DC

## 2022-03-14 NOTE — Progress Notes (Signed)
   HPI:Grace Cross is a 67 y.o. female who presents for evaluation of ER Follow up.  Patient went to the ED for abdominal pain and was diagnosed with gastritis.  She also had low oxygen saturation.  She is asymptomatic at rest.  She has had increased dyspnea for about a week and increased thick sputum production.  She has a history of COPD.  She stopped smoking 18 years ago.  She uses Symbicort daily and albuterol as needed.  No fevers, chills, or bodyaches.   Physical Exam: Vitals:   03/14/22 1529  BP: (!) 70/52  Pulse: 80  Resp: 17  SpO2: (!) 86%  Weight: 99 lb (44.9 kg)  Height: 5\' 1"  (1.549 m)     Physical Exam Constitutional:      General: She is not in acute distress.    Appearance: She is not ill-appearing.  Cardiovascular:     Rate and Rhythm: Normal rate and regular rhythm.     Heart sounds: No murmur heard. Pulmonary:     Effort: Pulmonary effort is normal. No respiratory distress.     Breath sounds: Examination of the right-lower field reveals decreased breath sounds. Examination of the left-lower field reveals decreased breath sounds. Decreased breath sounds present. No wheezing, rhonchi or rales.  Musculoskeletal:     Right lower leg: No edema.     Left lower leg: No edema.  Skin:    General: Skin is warm.     Coloration: Skin is not cyanotic.      Assessment & Plan:   COPD with exacerbation ([place]) PFTs reviewed from 2019, consistent with COPD.  - Start Trelegy - Stop Symbicort - predniSONE (DELTASONE) 20 MG tablet; Take 2 tablets (40 mg total) by mouth daily for 5 days.  Dispense: 10 tablet; Refill: 0 - For home use only DME oxygen - Ambulatory referral to Pulmonology - Follow up if symptoms worsen or fail to improve     Lorene Dy, MD

## 2022-03-14 NOTE — Patient Instructions (Addendum)
Thank you for trusting me with your care. To recap, today we discussed the following:   COPD with exacerbation - Start Trelegy - Stop Symbicort - predniSONE (DELTASONE) 20 MG tablet; Take 2 tablets (40 mg total) by mouth daily for 5 days.  Dispense: 10 tablet; Refill: 0 - For home use only DME oxygen - Ambulatory referral to Pulmonology - Follow up if symptoms worsen or fail to improve Patient went to the ED for short

## 2022-03-14 NOTE — Discharge Instructions (Addendum)
In December, you were prescribed omeprazole.  This is an acid blocker that will help your stomach wall heal.  You should take this daily.  A new prescription was sent to your pharmacy.    A separate prescription was also sent for a medication called sucralfate that you can take with meals to also promote stomach healing.    To further promote stomach healing, avoid alcohol, tobacco, and NSAID medications.  NSAID medications include ibuprofen, Aleve, Goody's powders, etc.  There is also a telephone number below to establish care with a gastroenterologist.  This is a doctor that can further manage your stomach inflammation.  Schedule follow-up appointment for soon as possible.  The urinalysis done today showed a urine infection.  You received your first dose of antibiotics in the ED.  This will cover you to tomorrow.  A prescription for continued antibiotic called Keflex was sent to your pharmacy.  Take this as prescribed for the next 5 days.  Although your oxygen was low today, this seemed to resolve with management of your pain.  Because of your COPD, you may, at some point, need increased medications at home and possibly supplemental oxygen at home.  You should start seeing a pulmonologist.  There is a telephone number below to establish care with a pulmonologist.

## 2022-03-14 NOTE — Assessment & Plan Note (Signed)
PFTs reviewed from 2019, consistent with COPD.  - Start Trelegy - Stop Symbicort - predniSONE (DELTASONE) 20 MG tablet; Take 2 tablets (40 mg total) by mouth daily for 5 days.  Dispense: 10 tablet; Refill: 0 - For home use only DME oxygen - Ambulatory referral to Pulmonology - Follow up if symptoms worsen or fail to improve

## 2022-03-15 ENCOUNTER — Telehealth: Payer: Self-pay | Admitting: Family Medicine

## 2022-03-15 NOTE — Telephone Encounter (Signed)
Patient daughter Williemae Area called said patient can not afford oxygen that was ordered in her visit yesterday that was sent to [facility], can this be sent to [facility]?  Cost was $ 150.00 down payment and $ 42.00 a month with [facility], patient can not afford this.  Patient was in the hospital the other night and needs oxygen now, patient can not afford it with [facility].  Patient daughter Williemae Area # [phone].

## 2022-03-16 LAB — URINE CULTURE: Culture: >=100000 — AB

## 2022-03-16 NOTE — Telephone Encounter (Signed)
Faxed to [facility] instead

## 2022-03-17 ENCOUNTER — Telehealth (HOSPITAL_BASED_OUTPATIENT_CLINIC_OR_DEPARTMENT_OTHER): Payer: Self-pay | Admitting: *Deleted

## 2022-03-17 NOTE — Addendum Note (Signed)
Addended by: Eual Fines on: 03/17/2022 09:05 AM   Modules accepted: Orders

## 2022-03-17 NOTE — Telephone Encounter (Signed)
Post ED Visit - Positive Culture Follow-up  Culture report reviewed by antimicrobial stewardship pharmacist: [place] Team []$  Elenor Quinones, Pharm.D. []$  Heide Guile, Pharm.D., BCPS AQ-ID []$  Parks Neptune, Pharm.D., BCPS []$  Alycia Rossetti, Pharm.D., BCPS []$  [place], [place].D., BCPS, AAHIVP []$  Legrand Como, Pharm.D., BCPS, AAHIVP []$  Salome Arnt, PharmD, BCPS []$  Johnnette Gourd, PharmD, BCPS []$  Hughes Better, PharmD, BCPS []$  Leeroy Cha, PharmD []$  Laqueta Linden, PharmD, BCPS []$  Albertina Parr, PharmD  [place] Team []$  Leodis Sias, PharmD []$  Lindell Spar, PharmD []$  Royetta Asal, PharmD []$  Graylin Shiver, Rph []$  Rema Fendt) Glennon Mac, PharmD []$  Arlyn Dunning, PharmD []$  Netta Cedars, PharmD []$  Dia Sitter, PharmD []$  Leone Haven, PharmD []$  Gretta Arab, PharmD []$  Theodis Shove, PharmD []$  Peggyann Juba, PharmD []$  Reuel Boom, PharmD   Positive urine culture Treated with Cephalexin, organism sensitive to the same and no further patient follow-up is required at this time.  Arturo Morton, PharmD  Harlon Flor Talley 03/17/2022, 11:11 AM

## 2022-03-30 ENCOUNTER — Telehealth: Payer: Self-pay

## 2022-03-30 ENCOUNTER — Telehealth: Payer: Self-pay | Admitting: Family Medicine

## 2022-03-30 ENCOUNTER — Other Ambulatory Visit: Payer: Self-pay

## 2022-03-30 DIAGNOSIS — J4540 Moderate persistent asthma, uncomplicated: Secondary | ICD-10-CM

## 2022-03-30 NOTE — Telephone Encounter (Signed)
Daughter called in on patient behalf in regard to inhaler samples. Wants a  call back

## 2022-03-30 NOTE — Telephone Encounter (Signed)
Samples for Trellegy 100 are out, checked with PCP if Trellegy 200 sample was okay to give to pt, [facility] [place] approved. Advised the pt to check with insurance about coverage for this medication, will call us back to let us know if they cover it so PCP can send a rx to pharmacy.

## 2022-03-30 NOTE — Telephone Encounter (Signed)
Please see other TE

## 2022-03-30 NOTE — Telephone Encounter (Signed)
Pt will be coming by to pick up a sample of trellegy as she has ran out from her visit with steen.

## 2022-04-17 NOTE — Progress Notes (Unsigned)
GI Office Note    Referring Provider: Alvira Monday, [place] Primary Care Physician:  Alvira Monday, [place]  Primary Gastroenterologist: Elon Alas. Abbey Chatters, DO, previously Dr. Oneida Alar  Chief Complaint   No chief complaint on file.   History of Present Illness   Grace Cross is a 67 y.o. female presenting today at the request of Alvira Monday, [place] for ***ED follow up of abdominal pain.   EGD May 2014: -Schatzki's ring at the GE junction s/p dilation -Moderate nonerosive gastritis with hemorrhage -Continue omeprazole daily -Follow GERD diet -BPE if dysphagia persists.  Colonoscopy May 2014: -Mild diverticulosis in the cecum -Moderate size internal hemorrhoids -Follow high-fiber diet and avoid items that cause bloating -Repeat colonoscopy in 10 years  Last office visit 04/18/17.  Having trouble swallowing twice a week or more.  Had been on Viberzi which worked well in controlling diarrhea however reported it was too expensive.  Had restarted Bentyl when diarrhea returned.  Triggers to diarrhea/loose stools including heavy greasy/fried foods.  Having burning in her chest after eating and feels like food is stuck.  Advised to follow mechanical soft/low FODMAP diet.  Scheduled for EGD with dilation with propofol.  Viberzi reordered.  ED visit 03/13/2022 for abdominal pain.  Reported laying in bed and had abdominal pain after she coughed to her right lower abdomen.  Pain migrated up to her right upper quadrant and subxiphoid area.  Reported pain persisted throughout the night.  Had 4 hours of pain relief then pain returned.  No to be hypertensive and tachycardic as well as hypoxic at urgent care.  At time of ED visit her abdominal pain had improved but still had some mild soreness.  Last bowel movement that morning.  Denied any nausea or vomiting.  She was given magnesium replacement, made pain medication with fentanyl, IV hydration with 1 L of LR, famotidine, GI cocktail.  She had  laboratory evidence of hypomagnesemia as well as UTI.  She did admit to urinary frequency.  Patient had resolution of discomfort with GI cocktail.  She did admit to recent ibuprofen use.  She was advised to take omeprazole daily as well as Carafate was also prescribed.  Given Keflex for UTI.  Advised follow-up care with pulmonology.  She underwent CTA chest as well as CT abdomen pelvis as noted below.  CT A/P and CTA chest 03/13/22: -Negative for acute PE -Emphysema without acute airspace disease, enlarged pulmonary trunk suggesting pulmonary arterial hypertension -Possible mild wall thickening and mucosal enhancement of gastric antrum possibly secondary to gastritis -Multiple bilateral kidney stones without hydronephrosis or ureteral stone, calcified uterine fibroids -Contracted gallbladder, no cholelithiasis or hepatic abnormality.  Today:    Current Outpatient Medications  Medication Sig Dispense Refill   acetaminophen (TYLENOL) 500 MG tablet Take 500 mg by mouth every 6 (six) hours as needed.     albuterol (PROAIR HFA) 108 (90 Base) MCG/ACT inhaler Inhale 2 puffs into the lungs every 6 (six) hours as needed. For shortness of breath 3 each 0   citalopram (CELEXA) 20 MG tablet Take 1 tablet (20 mg total) by mouth daily. 90 tablet 0   fluticasone (FLONASE) 50 MCG/ACT nasal spray Place 2 sprays into both nostrils daily. 16 g 6   Fluticasone-Umeclidin-Vilant (TRELEGY ELLIPTA) 100-62.5-25 MCG/ACT AEPB Inhale 1 puff into the lungs daily. 1 each 0   guaiFENesin (ROBITUSSIN) 100 MG/5ML liquid Take 5 mLs by mouth every 4 (four) hours as needed for cough or to loosen phlegm. 120 mL 0  Multiple Vitamins-Minerals (CENTRUM SILVER PO) Take by mouth.     omeprazole (PRILOSEC) 40 MG capsule Take 1 capsule (40 mg total) by mouth daily. 30 capsule 2   sucralfate (CARAFATE) 1 GM/10ML suspension Take 10 mLs (1 g total) by mouth with breakfast, with lunch, and with evening meal. 900 mL 0   No current  facility-administered medications for this visit.    Past Medical History:  Diagnosis Date   Acid reflux    Asthma    Asthma    Phreesia 05/21/2020   COPD (chronic obstructive pulmonary disease) ([place])    Depression    Depression    Phreesia 05/21/2020   Hypertension    Hypothyroidism    used to take thyroid medication    Past Surgical History:  Procedure Laterality Date   COLONOSCOPY, ESOPHAGOGASTRODUODENOSCOPY (EGD) AND ESOPHAGEAL DILATION N/A 06/17/2012   [place]:[id number] IN CECUM/Mod IH/ESO ring/Gastritis    Family History  Problem Relation Age of Onset   Asthma Mother    Heart attack Father    Congestive Heart Failure Father    Diabetes Sister    Hyperlipidemia Brother    Hypertension Brother    Asthma Brother    Hypertension Brother    Colon cancer Neg Hx    Inflammatory bowel disease Neg Hx     Allergies as of 04/18/2022 - Review Complete 03/13/2022  Allergen Reaction Noted   Penicillins Rash 03/17/2011    Social History   Socioeconomic History   Marital status: Single    Spouse name: Not on file   Number of children: 1   Years of education: Not on file   Highest education level: Not on file  Occupational History   Occupation: unemployed  Tobacco Use   Smoking status: Former    Packs/day: 0.50    Years: 22.00    Additional pack years: 0.00    Total pack years: 11.00    Types: Cigarettes    Quit date: 01/31/1995    Years since quitting: 27.2   Smokeless tobacco: Never  Vaping Use   Vaping Use: Never used  Substance and Sexual Activity   Alcohol use: Not Currently    Comment: occ 1-2 beer   Drug use: No   Sexual activity: Not Currently    Birth control/protection: Post-menopausal  Other Topics Concern   Not on file  Social History Narrative   Daughter present with her today   Social Determinants of Health   Financial Resource Strain: Low Risk  (01/14/2021)   Overall Financial Resource Strain (CARDIA)    Difficulty of Paying Living Expenses: Not  hard at all  Food Insecurity: No Food Insecurity (01/14/2021)   Hunger Vital Sign    Worried About Running Out of Food in the Last Year: Never true    [place] in the Last Year: Never true  Transportation Needs: No Transportation Needs (01/14/2021)   PRAPARE - Hydrologist (Medical): No    Lack of Transportation (Non-Medical): No  Physical Activity: Insufficiently Active (01/14/2021)   Exercise Vital Sign    Days of Exercise per Week: 2 days    Minutes of Exercise per Session: 20 min  Stress: No Stress Concern Present (01/14/2021)   [place]    Feeling of Stress : Not at all  Social Connections: Moderately Isolated (01/14/2021)   Social Connection and Isolation Panel [NHANES]    Frequency of Communication with Friends and Family: More  than three times a week    Frequency of Social Gatherings with Friends and Family: Once a week    Attends Religious Services: More than 4 times per year    Active Member of [employer] or Organizations: No    Attends Archivist Meetings: Never    Marital Status: Never married  Intimate Partner Violence: Not At Risk (01/14/2021)   Humiliation, Afraid, Rape, and Kick questionnaire    Fear of Current or Ex-Partner: No    Emotionally Abused: No    Physically Abused: No    Sexually Abused: No     Review of Systems   Gen: Denies any fever, chills, fatigue, weight loss, lack of appetite.  CV: Denies chest pain, heart palpitations, peripheral edema, syncope.  Resp: Denies shortness of breath at rest or with exertion. Denies wheezing or cough.  GI: see HPI GU : Denies urinary burning, urinary frequency, urinary hesitancy MS: Denies joint pain, muscle weakness, cramps, or limitation of movement.  Derm: Denies rash, itching, dry skin Psych: Denies depression, anxiety, memory loss, and confusion Heme: Denies bruising, bleeding, and enlarged lymph  nodes.   Physical Exam   There were no vitals taken for this visit.  General:   Alert and oriented. Pleasant and cooperative. Well-nourished and well-developed.  Head:  Normocephalic and atraumatic. Eyes:  Without icterus, sclera clear and conjunctiva pink.  Ears:  Normal auditory acuity. Mouth:  No deformity or lesions, oral mucosa pink.  Lungs:  Clear to auscultation bilaterally. No wheezes, rales, or rhonchi. No distress.  Heart:  S1, S2 present without murmurs appreciated.  Abdomen:  +BS, soft, non-tender and non-distended. No HSM noted. No guarding or rebound. No masses appreciated.  Rectal:  Deferred  Msk:  Symmetrical without gross deformities. Normal posture. Extremities:  Without edema. Neurologic:  Alert and  oriented x4;  grossly normal neurologically. Skin:  Intact without significant lesions or rashes. Psych:  Alert and cooperative. Normal mood and affect.   Assessment   Grace Cross is a 67 y.o. female with a history of GERD and dysphagia, COPD, asthma, depression, hypertension, hypothyroidism*** presenting today with   Diarrhea:  GERD, epigastric pain: Currently on omeprazole 40 mg once daily.  Recently prescribed course of Carafate. ***  Screening for colon cancer: Last colonoscopy in May 2014 with cecal diverticulosis.  Advise repeat in 10 years.  Will be due for screening in 2 months (May 2024).    PLAN   ***    Venetia Night, MSN, FNP-BC, AGACNP-BC [facility]

## 2022-04-18 ENCOUNTER — Telehealth (INDEPENDENT_AMBULATORY_CARE_PROVIDER_SITE_OTHER): Payer: Self-pay | Admitting: *Deleted

## 2022-04-18 ENCOUNTER — Encounter: Payer: Self-pay | Admitting: Gastroenterology

## 2022-04-18 ENCOUNTER — Ambulatory Visit (INDEPENDENT_AMBULATORY_CARE_PROVIDER_SITE_OTHER): Payer: Medicare Other | Admitting: Gastroenterology

## 2022-04-18 VITALS — BP 107/77 | HR 87 | Temp 98.4°F | Ht 61.0 in | Wt 101.5 lb

## 2022-04-18 DIAGNOSIS — Z1211 Encounter for screening for malignant neoplasm of colon: Secondary | ICD-10-CM | POA: Diagnosis not present

## 2022-04-18 DIAGNOSIS — K580 Irritable bowel syndrome with diarrhea: Secondary | ICD-10-CM

## 2022-04-18 DIAGNOSIS — K219 Gastro-esophageal reflux disease without esophagitis: Secondary | ICD-10-CM | POA: Diagnosis not present

## 2022-04-18 DIAGNOSIS — R1319 Other dysphagia: Secondary | ICD-10-CM | POA: Diagnosis not present

## 2022-04-18 NOTE — Telephone Encounter (Signed)
Pt has consult 3/21 with Dr. Melvyn Novas. Patient needing pulmonary clearance to have colonoscopy/egd with possible esophageal dilation with Dr. Abbey Chatters. Not scheduled yet. Will be done under [facility].

## 2022-04-18 NOTE — Patient Instructions (Addendum)
We are scheduling you for a upper endoscopy with possible dilation and colonoscopy in the near future with Dr. Abbey Chatters.  We will reach out to you to schedule once we have received clearance from pulmonology once you see them this week.  Continue taking omeprazole 40 mg once daily.  You may continue your current supply of Carafate and then stop.  Monitor for any worsening upper abdominal pain or reflux symptoms.  If you begin to experience any worsening of your symptoms please let us know.  Follow a GERD diet:  Avoid fried, fatty, greasy, spicy, citrus foods. Avoid caffeine and carbonated beverages. Avoid chocolate. Try eating 4-6 small meals a day rather than 3 large meals. Do not eat within 3 hours of laying down. Prop head of bed up on wood or bricks to create a 6 inch incline.  It was a pleasure to see you today. I want to create trusting relationships with patients. If you receive a survey regarding your visit,  I greatly appreciate you taking time to fill this out on paper or through your MyChart. I value your feedback.  Venetia Night, MSN, FNP-BC, AGACNP-BC [facility]

## 2022-04-20 ENCOUNTER — Ambulatory Visit (INDEPENDENT_AMBULATORY_CARE_PROVIDER_SITE_OTHER): Payer: Medicare Other | Admitting: Internal Medicine

## 2022-04-20 ENCOUNTER — Encounter: Payer: Self-pay | Admitting: Internal Medicine

## 2022-04-20 VITALS — BP 90/62 | HR 78 | Ht 61.0 in | Wt 101.2 lb

## 2022-04-20 DIAGNOSIS — J9611 Chronic respiratory failure with hypoxia: Secondary | ICD-10-CM | POA: Diagnosis not present

## 2022-04-20 DIAGNOSIS — J9612 Chronic respiratory failure with hypercapnia: Secondary | ICD-10-CM | POA: Diagnosis not present

## 2022-04-20 DIAGNOSIS — J449 Chronic obstructive pulmonary disease, unspecified: Secondary | ICD-10-CM | POA: Diagnosis not present

## 2022-04-20 NOTE — Progress Notes (Signed)
Grace Cross, female    DOB: [birth year]    MRN: [id number]   Brief patient profile:  75  yobf quit smoking around 2000 due to sob /cough referred to pulmonary clinic in [facility]  04/20/2022 by EDP at [facility] for hypoxemia        History of Present Illness on Trelegy 200 no change vs symbicort  04/20/2022  Pulmonary/ 1st office eval/ Melvyn Novas / [facility] clearance for egd/colonsocpy  Chief Complaint  Patient presents with   Consult    Pt consult after ED visit, she reports having drops in oxygen sats. PCP ordered O2 for PRN use and at night. She uses 2-3L. Also experiencing a dry cough > 1 month.  Dyspnea:  [employer] walking s 02 slow pace = MMRC2 = can't walk a nl pace on a flat grade s sob but does fine slow and flat   Cough: clear  and thick  Sleep: bed is flat lots of pillows  SABA use: none  02: 2lpm hs and none during the day     No obvious day to day or daytime pattern/variability or assoc excess/ purulent sputum or mucus plugs or hemoptysis or cp or chest tightness, subjective wheeze or overt sinus or hb symptoms.   Sleeping as above  without nocturnal  or early am exacerbation  of respiratory  c/o's or need for noct saba. Also denies any obvious fluctuation of symptoms with weather or environmental changes or other aggravating or alleviating factors except as outlined above   No unusual exposure hx or h/o childhood pna/ asthma or knowledge of premature birth.  Current Allergies, Complete Past Medical History, Past Surgical History, Family History, and Social History were reviewed in [employer] record.  ROS  The following are not active complaints unless bolded Hoarseness, sore throat, dysphagia, dental problems, itching, sneezing,  nasal congestion or discharge of excess mucus or purulent secretions, ear ache,   fever, chills, sweats, unintended wt loss or wt gain, classically pleuritic or exertional cp,  orthopnea pnd or arm/hand swelling   or leg swelling, presyncope, palpitations, abdominal pain, anorexia, nausea, vomiting, diarrhea  or change in bowel habits or change in bladder habits, change in stools or change in urine, dysuria, hematuria,  rash, arthralgias, visual complaints, headache, numbness, weakness or ataxia or problems with walking or coordination,  change in mood or  memory.           Past Medical History:  Diagnosis Date   Acid reflux    Asthma    Asthma    Phreesia 05/21/2020   COPD (chronic obstructive pulmonary disease) ([place])    Depression    Depression    Phreesia 05/21/2020   Hypertension    Hypothyroidism    used to take thyroid medication    Outpatient Medications Prior to Visit  Medication Sig Dispense Refill   budesonide-formoterol (SYMBICORT) 160-4.5 MCG/ACT inhaler Inhale 2 puffs into the lungs 2 (two) times daily.     citalopram (CELEXA) 20 MG tablet Take 1 tablet (20 mg total) by mouth daily. 90 tablet 0   fluticasone (FLONASE) 50 MCG/ACT nasal spray Place 2 sprays into both nostrils daily. 16 g 6   Fluticasone-Umeclidin-Vilant (TRELEGY ELLIPTA) 100-62.5-25 MCG/ACT AEPB Inhale 1 puff into the lungs daily. 1 each 0   Multiple Vitamins-Minerals (CENTRUM SILVER PO) Take by mouth.     omeprazole (PRILOSEC) 40 MG capsule Take 1 capsule (40 mg total) by mouth daily. 30 capsule 2  sucralfate (CARAFATE) 1 GM/10ML suspension Take 10 mLs (1 g total) by mouth with breakfast, with lunch, and with evening meal. 900 mL 0   acetaminophen (TYLENOL) 500 MG tablet Take 500 mg by mouth every 6 (six) hours as needed.     albuterol (PROAIR HFA) 108 (90 Base) MCG/ACT inhaler Inhale 2 puffs into the lungs every 6 (six) hours as needed. For shortness of breath 3 each 0   No facility-administered medications prior to visit.     Objective:     Ht 5\' 1"  (1.549 m)   Wt 101 lb 3.2 oz (45.9 kg)   BMI 19.12 kg/m   Wt Readings from Last 3 Encounters:  04/20/22 101 lb 3.2 oz (45.9 kg)  04/18/22 101 lb 8 oz (46  kg)  03/14/22 99 lb (44.9 kg)     Vital signs reviewed  04/20/2022  - Note at rest 02 sats  93% on RA   General appearance:    pleasant thin amb bf nad   HEENT : Oropharynx  clear/ edentulous       NECK :  without  apparent JVD/ palpable Nodes/TM    LUNGS: no acc muscle use,  Mild barrel  contour chest wall with bilateral  Distant bs s audible wheeze and  without cough on insp or exp maneuvers  and mild  Hyperresonant  to  percussion bilaterally     CV:  RRR  no s3 or murmur or increase in P2, and no edema   ABD:  soft and nontender with pos end  insp Hoover's  in the supine position.  No bruits or organomegaly appreciated   MS:  Nl gait/ ext warm without deformities Or obvious joint restrictions  calf tenderness, cyanosis or clubbing     SKIN: warm and dry without lesions    NEURO:  alert, approp, nl sensorium with  no motor or cerebellar deficits apparent.         I personally reviewed images and agree with radiology impression as follows:    CTa 03/13/2022  1. Negative for acute pulmonary embolus. 2. Emphysema without acute airspace disease. Enlarged pulmonary trunk suggesting pulmonary arterial hypertension. 3. Possible mild wall thickening and mucosal enhancement of the gastric antrum which may be due to gastritis. 4. Multiple bilateral kidney stones without hydronephrosis or ureteral stone. Calcified uterine fibroids. 5. Aortic atherosclerosis.   Assessment   COPD  GOLD ?/ emphysematous features Quit smoking 2000 - CTa 03/13/22 pos mod emphysema with ? PH - 04/20/2022  After extensive coaching inhaler device,  effectiveness =    60% with hfa and can't afford trelegy 200 so rec change to symbicort 160 2bid   Clinically Pt is Group B in terms of symptom/risk and laba/lama therefore appropriate rx at this point >>> but already has free supply of symbicort so will use this up first then change to bevespi or stiolto as probably no need for ICS in this setting.         Chronic respiratory failure with hypoxia and hypercapnia (HCC) Placed on 2lpm hs by ER 03/2022  - 04/20/2022   Walked on RA  x  1  lap(s) =  approx 150  ft  @ mod pace, stopped due to desats to 86% s sob > declined portable 02  The new guidelines suggest in stable copd there's limited benefit in asymptomatic settings like this to using 02 but she also has likely cor pulmonale so I rec:  Monitor sats paced ex  Wear  2lpm hs  Regroup in 6 weeks to repeat walking sats   She is cleared for egd/colonoscopy  Each maintenance medication was reviewed in detail including emphasizing most importantly the difference between maintenance and prns and under what circumstances the prns are to be triggered using an action plan format where appropriate.  Total time for H and P, chart review, counseling, reviewing hfa/02/dpi device(s) , directly observing portions of ambulatory 02 saturation study/ and generating customized AVS unique to this office visit / same day charting > 60 min new pt eval                   Christinia Gully, MD 04/20/2022

## 2022-04-20 NOTE — Patient Instructions (Addendum)
Change to Symbicort 160 Take 2 puffs first thing in am and then another 2 puffs about 12 hours later.    Work on inhaler technique:  relax and gently blow all the way out then take a nice smooth full deep breath back in, triggering the inhaler at same time you start breathing in.  Hold breath in for at least  5 seconds if you can. Blow out symbicort  thru nose. Rinse and gargle with water when done.  If mouth or throat bother you at all,  try brushing teeth/gums/tongue with arm and hammer toothpaste/ make a slurry and gargle and spit out.  - remember how golfers warm up by taking practice swings with empy symbicort  Make sure you check your oxygen saturation at your highest level of activity(NOT after you stop)  to be sure it stays over 88% and keep track of it at least once a week, more often if breathing getting worse, and let me know if losing ground. (Collect the dots to connect the dots approach). Pace yourself to keep it from dropping too low and if you change your mind in the next 30 days we can order portable 0xygen     Please schedule a follow up office visit in 6 weeks, call sooner if needed with all medications /inhalers/ solutions in hand so we can verify exactly what you are taking. This includes all medications from all doctors and over the counters

## 2022-04-20 NOTE — Assessment & Plan Note (Addendum)
Placed on 2lpm hs by ER 03/2022  - 04/20/2022   Walked on RA  x  1  lap(s) =  approx 150  ft  @ mod pace, stopped due to desats to 86% s sob > declined portable 02  The new guidelines suggest in stable copd there's limited benefit in asymptomatic settings like this to using 02 but she also has likely cor pulmonale so I rec:  Monitor sats paced ex  Wear 2lpm hs  Regroup in 6 weeks to repeat walking sats   She is cleared for egd/colonoscopy  Each maintenance medication was reviewed in detail including emphasizing most importantly the difference between maintenance and prns and under what circumstances the prns are to be triggered using an action plan format where appropriate.  Total time for H and P, chart review, counseling, reviewing hfa/02/dpi device(s) , directly observing portions of ambulatory 02 saturation study/ and generating customized AVS unique to this office visit / same day charting > 60 min new pt eval

## 2022-04-20 NOTE — Telephone Encounter (Signed)
Cleared for surgery - see ov 04/18/2022

## 2022-04-20 NOTE — Assessment & Plan Note (Addendum)
Quit smoking 2000 - CTa 03/13/22 pos mod emphysema with ? PH - 04/20/2022  After extensive coaching inhaler device,  effectiveness =    60% with hfa and can't afford trelegy 200 so rec change to symbicort 160 2bid   Clinically Pt is Group B in terms of symptom/risk and laba/lama therefore appropriate rx at this point >>> but already has free supply of symbicort so will use this up first then change to bevespi or stiolto as probably no need for ICS in this setting.

## 2022-04-21 ENCOUNTER — Encounter: Payer: Self-pay | Admitting: Family Medicine

## 2022-04-21 ENCOUNTER — Ambulatory Visit (INDEPENDENT_AMBULATORY_CARE_PROVIDER_SITE_OTHER): Payer: Medicare Other | Admitting: Family Medicine

## 2022-04-21 VITALS — BP 108/68 | HR 80 | Ht 61.0 in | Wt 101.0 lb

## 2022-04-21 DIAGNOSIS — M8588 Other specified disorders of bone density and structure, other site: Secondary | ICD-10-CM

## 2022-04-21 DIAGNOSIS — F339 Major depressive disorder, recurrent, unspecified: Secondary | ICD-10-CM

## 2022-04-21 DIAGNOSIS — Z1231 Encounter for screening mammogram for malignant neoplasm of breast: Secondary | ICD-10-CM

## 2022-04-21 DIAGNOSIS — Z13820 Encounter for screening for osteoporosis: Secondary | ICD-10-CM | POA: Diagnosis not present

## 2022-04-21 DIAGNOSIS — K580 Irritable bowel syndrome with diarrhea: Secondary | ICD-10-CM | POA: Diagnosis not present

## 2022-04-21 DIAGNOSIS — Z23 Encounter for immunization: Secondary | ICD-10-CM

## 2022-04-21 MED ORDER — CITALOPRAM HYDROBROMIDE 20 MG PO TABS: 20.0000 mg | ORAL_TABLET | Freq: Every day | ORAL | 2 refills | Status: DC

## 2022-04-21 NOTE — Progress Notes (Signed)
Established Patient Office Visit  Subjective:  Patient ID: Grace Cross, female    DOB: [birth year]  Age: 67 y.o. MRN: [id number]  CC:  Chief Complaint  Patient presents with   Follow-up    3 month f/u, pt states GI provider is wanting to wing her off of sucralfate, however this is the only thing that has seemed to help her with her stomach. Would like to discuss this. Also o2 levels are low is seeing pulmonology.     HPI Grace Cross is a 67 y.o. female with past medical history of depression and IBS presents for f/u of  chronic medical conditions. For the details of today's visit, please refer to the assessment and plan.     Past Medical History:  Diagnosis Date   Acid reflux    Asthma    Asthma    Phreesia 05/21/2020   COPD (chronic obstructive pulmonary disease) ([place])    Depression    Depression    Phreesia 05/21/2020   Hypertension    Hypothyroidism    used to take thyroid medication    Past Surgical History:  Procedure Laterality Date   COLONOSCOPY, ESOPHAGOGASTRODUODENOSCOPY (EGD) AND ESOPHAGEAL DILATION N/A 06/17/2012   [id number] IN CECUM/Mod IH/ESO ring/Gastritis    Family History  Problem Relation Age of Onset   Asthma Mother    Heart attack Father    Congestive Heart Failure Father    Diabetes Sister    Hyperlipidemia Brother    Hypertension Brother    Asthma Brother    Hypertension Brother    Colon cancer Neg Hx    Inflammatory bowel disease Neg Hx     Social History   Socioeconomic History   Marital status: Single    Spouse name: Not on file   Number of children: 1   Years of education: Not on file   Highest education level: Not on file  Occupational History   Occupation: unemployed  Tobacco Use   Smoking status: Former    Packs/day: 0.50    Years: 22.00    Additional pack years: 0.00    Total pack years: 11.00    Types: Cigarettes    Quit date: 01/31/1995    Years since quitting: 27.2   Smokeless tobacco: Never  Vaping Use   Vaping  Use: Never used  Substance and Sexual Activity   Alcohol use: Not Currently    Comment: occ 1-2 beer   Drug use: No   Sexual activity: Not Currently    Birth control/protection: Post-menopausal  Other Topics Concern   Not on file  Social History Narrative   Daughter present with her today   Social Determinants of Health   Financial Resource Strain: Low Risk  (01/14/2021)   Overall Financial Resource Strain (CARDIA)    Difficulty of Paying Living Expenses: Not hard at all  Food Insecurity: No Food Insecurity (01/14/2021)   Hunger Vital Sign    Worried About Running Out of Food in the Last Year: Never true    Ran Out of Food in the Last Year: Never true  Transportation Needs: No Transportation Needs (01/14/2021)   PRAPARE - Hydrologist (Medical): No    Lack of Transportation (Non-Medical): No  Physical Activity: Insufficiently Active (01/14/2021)   Exercise Vital Sign    Days of Exercise per Week: 2 days    Minutes of Exercise per Session: 20 min  Stress: No Stress Concern Present (01/14/2021)   [employer] of  Occupational Health - Occupational Stress Questionnaire    Feeling of Stress : Not at all  Social Connections: Moderately Isolated (01/14/2021)   Social Connection and Isolation Panel [NHANES]    Frequency of Communication with Friends and Family: More than three times a week    Frequency of Social Gatherings with Friends and Family: Once a week    Attends Religious Services: More than 4 times per year    Active Member of [employer] or Organizations: No    Attends Archivist Meetings: Never    Marital Status: Never married  Intimate Partner Violence: Not At Risk (01/14/2021)   Humiliation, Afraid, Rape, and Kick questionnaire    Fear of Current or Ex-Partner: No    Emotionally Abused: No    Physically Abused: No    Sexually Abused: No    Outpatient Medications Prior to Visit  Medication Sig Dispense Refill   acetaminophen  (TYLENOL) 500 MG tablet Take 500 mg by mouth every 6 (six) hours as needed.     budesonide-formoterol (SYMBICORT) 160-4.5 MCG/ACT inhaler Inhale 2 puffs into the lungs 2 (two) times daily.     fluticasone (FLONASE) 50 MCG/ACT nasal spray Place 2 sprays into both nostrils daily. 16 g 6   Fluticasone-Umeclidin-Vilant (TRELEGY ELLIPTA) 100-62.5-25 MCG/ACT AEPB Inhale 1 puff into the lungs daily. 1 each 0   Multiple Vitamins-Minerals (CENTRUM SILVER PO) Take by mouth.     omeprazole (PRILOSEC) 40 MG capsule Take 1 capsule (40 mg total) by mouth daily. 30 capsule 2   sucralfate (CARAFATE) 1 GM/10ML suspension Take 10 mLs (1 g total) by mouth with breakfast, with lunch, and with evening meal. 900 mL 0   citalopram (CELEXA) 20 MG tablet Take 1 tablet (20 mg total) by mouth daily. 90 tablet 0   No facility-administered medications prior to visit.    Allergies  Allergen Reactions   Penicillins Rash    ROS Review of Systems  Constitutional:  Negative for chills and fever.  Eyes:  Negative for visual disturbance.  Respiratory:  Negative for chest tightness and shortness of breath.   Neurological:  Negative for dizziness and headaches.      Objective:    Physical Exam HENT:     Head: Normocephalic.     Mouth/Throat:     Mouth: Mucous membranes are moist.  Cardiovascular:     Rate and Rhythm: Normal rate.     Heart sounds: Normal heart sounds.  Pulmonary:     Effort: Pulmonary effort is normal.     Breath sounds: Normal breath sounds.  Neurological:     Mental Status: She is alert.     BP 108/68 (BP Location: Left Arm)   Pulse 80   Ht 5\' 1"  (1.549 m)   Wt 101 lb (45.8 kg)   SpO2 92%   BMI 19.08 kg/m  Wt Readings from Last 3 Encounters:  04/21/22 101 lb (45.8 kg)  04/20/22 101 lb 3.2 oz (45.9 kg)  04/18/22 101 lb 8 oz (46 kg)    Lab Results  Component Value Date   TSH 1.176 03/13/2022   Lab Results  Component Value Date   WBC 4.8 03/13/2022   HGB 11.2 (L) 03/13/2022    HCT 34.8 (L) 03/13/2022   MCV 94.8 03/13/2022   PLT 244 03/13/2022   Lab Results  Component Value Date   NA 134 (L) 03/13/2022   K 3.9 03/13/2022   CO2 24 03/13/2022   GLUCOSE 108 (H) 03/13/2022   BUN 24 (  H) 03/13/2022   CREATININE 1.39 (H) 03/13/2022   BILITOT 0.2 (L) 03/13/2022   ALKPHOS 90 03/13/2022   AST 27 03/13/2022   ALT 17 03/13/2022   PROT 7.8 03/13/2022   ALBUMIN 3.3 (L) 03/13/2022   CALCIUM 8.7 (L) 03/13/2022   ANIONGAP 8 03/13/2022   EGFR 41 (L) 05/24/2021   Lab Results  Component Value Date   CHOL 154 12/22/2020   Lab Results  Component Value Date   HDL 74 12/22/2020   Lab Results  Component Value Date   LDLCALC 67 12/22/2020   Lab Results  Component Value Date   TRIG 64 12/22/2020   Lab Results  Component Value Date   CHOLHDL 2.4 11/11/2019   Lab Results  Component Value Date   HGBA1C 5.2 11/11/2019      Assessment & Plan:  Depression, recurrent (HCC) Assessment & Plan: Stable on Celexa 20 mg daily PHQ 9 is 0 Denies thoughts and ideation Refill sent to the pharmacy   Orders: -     Citalopram Hydrobromide; Take 1 tablet (20 mg total) by mouth daily.  Dispense: 90 tablet; Refill: 2  Irritable bowel syndrome with diarrhea Assessment & Plan: Stable Encouraged to follow-up with GI Encouraged to continue to take omeprazole 20 mg daily   Breast cancer screening by mammogram -     3D Screening Mammogram, Left and Right  Osteoporosis screening  Screening for osteoporosis  Encounter for screening for osteoporosis -     DG Bone Density  Other specified disorders of bone density and structure, other site -     DG Bone Density  Immunization due -     Pneumococcal conjugate vaccine 20-valent    Follow-up: Return in about 3 months (around 07/22/2022).   Alvira Monday, FNP

## 2022-04-21 NOTE — Patient Instructions (Addendum)
   I appreciate the opportunity to provide care to you today!    Follow up:  3 months  Labs: please stop by the lab today to get your blood drawn (CBC, CMP, TSH, Lipid profile, HgA1c, Vit D)   Please pick up your refill at the pharmacy   Please schedule for Mammogram and Bone Density on Mobile Unit Per Patient request  Please schedule Medicare Annual Exam   Please continue to a heart-healthy diet and increase your physical activities. Try to exercise for 42mins at least five days a week.      It was a pleasure to see you and I look forward to continuing to work together on your health and well-being. Please do not hesitate to call the office if you need care or have questions about your care.   Have a wonderful day and week. With Gratitude, Alvira Monday MSN, FNP-BC

## 2022-04-21 NOTE — Assessment & Plan Note (Addendum)
Stable on Celexa 20 mg daily PHQ 9 is 0 Denies thoughts and ideation Refill sent to the pharmacy

## 2022-04-21 NOTE — Assessment & Plan Note (Signed)
Stable Encouraged to follow-up with GI Encouraged to continue to take omeprazole 20 mg daily

## 2022-04-25 ENCOUNTER — Encounter (INDEPENDENT_AMBULATORY_CARE_PROVIDER_SITE_OTHER): Payer: Self-pay | Admitting: *Deleted

## 2022-04-25 NOTE — Telephone Encounter (Signed)
Spoke with pt daughter. Scheduled for 4/25 at 12:30pm. Aware will mail instructions/pre-op appt. Rx for prep sent to pharmacy.

## 2022-04-28 ENCOUNTER — Encounter: Payer: Self-pay | Admitting: Family Medicine

## 2022-04-28 ENCOUNTER — Ambulatory Visit (INDEPENDENT_AMBULATORY_CARE_PROVIDER_SITE_OTHER): Payer: Medicare Other | Admitting: Family Medicine

## 2022-04-28 VITALS — Ht 61.0 in | Wt 101.0 lb

## 2022-04-28 DIAGNOSIS — Z0000 Encounter for general adult medical examination without abnormal findings: Secondary | ICD-10-CM

## 2022-04-28 DIAGNOSIS — Z0100 Encounter for examination of eyes and vision without abnormal findings: Secondary | ICD-10-CM

## 2022-04-28 NOTE — Progress Notes (Signed)
Subjective:   Grace Cross is a 67 y.o. female who presents for Medicare Annual (Subsequent) preventive examination.  I connected with  Blenda Nicely on 04/28/22 by a audio enabled telemedicine application and verified that I am speaking with the correct person using two identifiers.  Patient Location: Home  Provider Location: Office/Clinic  I discussed the limitations of evaluation and management by telemedicine. The patient expressed understanding and agreed to proceed.  Review of Systems    Patient denies pain, fever, chills, chest pain, palpations ,shortness of breath, blurred vision,cough, abdominal pain, nausea, vomiting, headache, dizziness. Patient is not feeling nervous or anxious . Cardiac Risk Factors include: advanced age (>9men, >1 women)     Objective:    Today's Vitals   04/28/22 1340  Weight: 101 lb (45.8 kg)  Height: 5\' 1"  (1.549 m)  PainSc: 0-No pain   Body mass index is 19.08 kg/m.     04/28/2022    1:44 PM 03/13/2022    6:52 PM 05/15/2021    4:28 AM 01/14/2021   10:43 AM 10/19/2017    1:04 AM 10/18/2017    3:49 PM 01/24/2017   11:37 AM  Advanced Directives  Does Patient Have a Medical Advance Directive? No No No No No No No  Would patient like information on creating a medical advance directive? No - Patient declined  No - Patient declined  No - Patient declined No - Patient declined No - Patient declined    Current Medications (verified) Outpatient Encounter Medications as of 04/28/2022  Medication Sig   acetaminophen (TYLENOL) 500 MG tablet Take 500 mg by mouth every 6 (six) hours as needed.   budesonide-formoterol (SYMBICORT) 160-4.5 MCG/ACT inhaler Inhale 2 puffs into the lungs 2 (two) times daily.   citalopram (CELEXA) 20 MG tablet Take 1 tablet (20 mg total) by mouth daily.   fluticasone (FLONASE) 50 MCG/ACT nasal spray Place 2 sprays into both nostrils daily.   Fluticasone-Umeclidin-Vilant (TRELEGY ELLIPTA) 100-62.5-25 MCG/ACT AEPB Inhale 1  puff into the lungs daily.   Multiple Vitamins-Minerals (CENTRUM SILVER PO) Take by mouth.   omeprazole (PRILOSEC) 40 MG capsule Take 1 capsule (40 mg total) by mouth daily.   sucralfate (CARAFATE) 1 GM/10ML suspension Take 10 mLs (1 g total) by mouth with breakfast, with lunch, and with evening meal.   No facility-administered encounter medications on file as of 04/28/2022.    Allergies (verified) Penicillins   History: Past Medical History:  Diagnosis Date   Acid reflux    Asthma    Asthma    Phreesia 05/21/2020   COPD (chronic obstructive pulmonary disease) ([facility])    Depression    Depression    Phreesia 05/21/2020   Hypertension    Hypothyroidism    used to take thyroid medication   Past Surgical History:  Procedure Laterality Date   COLONOSCOPY, ESOPHAGOGASTRODUODENOSCOPY (EGD) AND ESOPHAGEAL DILATION N/A 06/17/2012   :[id number] IN CECUM/Mod IH/ESO ring/Gastritis   Family History  Problem Relation Age of Onset   Asthma Mother    Heart attack Father    Congestive Heart Failure Father    Diabetes Sister    Hyperlipidemia Brother    Hypertension Brother    Asthma Brother    Hypertension Brother    Colon cancer Neg Hx    Inflammatory bowel disease Neg Hx    Social History   Socioeconomic History   Marital status: Single    Spouse name: Not on file   Number of children: 1  Years of education: Not on file   Highest education level: Not on file  Occupational History   Occupation: unemployed  Tobacco Use   Smoking status: Former    Packs/day: 0.50    Years: 22.00    Additional pack years: 0.00    Total pack years: 11.00    Types: Cigarettes    Quit date: 01/31/1995    Years since quitting: 27.2   Smokeless tobacco: Never  Vaping Use   Vaping Use: Never used  Substance and Sexual Activity   Alcohol use: Not Currently    Comment: occ 1-2 beer   Drug use: No   Sexual activity: Not Currently    Birth control/protection: Post-menopausal  Other Topics Concern    Not on file  Social History Narrative   Daughter present with her today   Social Determinants of Health   Financial Resource Strain: Medium Risk (04/28/2022)   Overall Financial Resource Strain (CARDIA)    Difficulty of Paying Living Expenses: Somewhat hard  Food Insecurity: Food Insecurity Present (04/28/2022)   Hunger Vital Sign    Worried About Running Out of Food in the Last Year: Often true    Ran Out of Food in the Last Year: Often true  Transportation Needs: No Transportation Needs (04/28/2022)   PRAPARE - Hydrologist (Medical): No    Lack of Transportation (Non-Medical): No  Physical Activity: Inactive (04/28/2022)   Exercise Vital Sign    Days of Exercise per Week: 0 days    Minutes of Exercise per Session: 0 min  Stress: Stress Concern Present (04/28/2022)   [place]    Feeling of Stress : To some extent  Social Connections: Unknown (04/28/2022)   Social Connection and Isolation Panel [NHANES]    Frequency of Communication with Friends and Family: Three times a week    Frequency of Social Gatherings with Friends and Family: Once a week    Attends Religious Services: Never    Marine scientist or Organizations: No    Attends Music therapist: Never    Marital Status: Patient declined    Tobacco Counseling Counseling given: Not Answered   Clinical Intake:  Pre-visit preparation completed: No  Pain : No/denies pain Pain Score: 0-No pain     Nutritional Status: BMI of 19-24  Normal Diabetes: No  How often do you need to have someone help you when you read instructions, pamphlets, or other written materials from your doctor or pharmacy?: 1 - Never  Diabetic? No Hemoglobin A1c 5.2  Interpreter Needed?: No      Activities of Daily Living    04/28/2022    1:44 PM  In your present state of health, do you have any difficulty performing the  following activities:  Hearing? 0  Vision? 0  Difficulty concentrating or making decisions? 0  Walking or climbing stairs? 0  Dressing or bathing? 0  Doing errands, shopping? 0  Preparing Food and eating ? N  Using the Toilet? N  In the past six months, have you accidently leaked urine? N  Do you have problems with loss of bowel control? N  Managing your Medications? N  Managing your Finances? N  Housekeeping or managing your Housekeeping? N    Patient Care Team: Alvira Monday, FNP as PCP - General (Family Medicine) Satira Sark, MD as PCP - Cardiology (Cardiology) Danie Binder, MD (Inactive) as Consulting Physician (Gastroenterology)  Indicate  any recent Medical Services you may have received from other than [facility] providers in the past year (date may be approximate).     Assessment:   This is a routine wellness examination for [facility].  Hearing/Vision screen No results found.  Dietary issues and exercise activities discussed:  Discussed lifestyle modifications follow diet low in saturated fat, reduce dietary salt intake, avoid fatty foods, maintain an exercise routine 3 to 5 days a week for a minimum total of 150 minutes.    Goals Addressed   None    Depression Screen    04/21/2022    2:52 PM 03/14/2022    3:30 PM 01/19/2022    3:02 PM 10/17/2021    3:06 PM 05/24/2021    2:28 PM 01/14/2021   10:44 AM 01/14/2021   10:40 AM  PHQ 2/9 Scores  PHQ - 2 Score 0 4 1 0 0 0 1  PHQ- 9 Score 0 10 4        Fall Risk    04/28/2022    1:44 PM 04/21/2022    2:52 PM 03/14/2022    3:30 PM 01/19/2022    3:02 PM 10/17/2021    3:06 PM  [place] in the past year? 0 0 0 0 0  Number falls in past yr: 0 0 0 0 0  Injury with Fall? 0 0 0 0 0  Risk for fall due to : No Fall Risks No Fall Risks  No Fall Risks No Fall Risks  Follow up Falls evaluation completed Falls evaluation completed  Falls evaluation completed Falls evaluation completed    [place]:  Any stairs in or around the home? No  If so, are there any without handrails? No  Home free of loose throw rugs in walkways, pet beds, electrical cords, etc? Yes  Adequate lighting in your home to reduce risk of falls? Yes   ASSISTIVE DEVICES UTILIZED TO PREVENT FALLS:  Life alert? No  Use of a cane, walker or w/c? No  Grab bars in the bathroom? No  Shower chair or bench in shower? No  Elevated toilet seat or a handicapped toilet? No    Cognitive Function:        04/28/2022    1:44 PM 01/14/2021   10:45 AM  6CIT Screen  What Year? 0 points 0 points  What month? 0 points 0 points  What time? 0 points 0 points  Count back from 20 0 points 0 points  Months in reverse 4 points 4 points  Repeat phrase 2 points 0 points  Total Score 6 points 4 points    Immunizations Immunization History  Administered Date(s) Administered   Fluad Quad(high Dose 65+) 01/14/2021, 01/19/2022   PFIZER(Purple Top)SARS-COV-2 Vaccination 09/05/2019, 10/10/2019   PNEUMOCOCCAL CONJUGATE-20 04/21/2022   Pneumococcal Polysaccharide-23 09/08/2017   Tdap 09/08/2017   Zoster Recombinat (Shingrix) 09/08/2017    TDAP status: Up to date  Flu Vaccine status: Up to date  Pneumococcal vaccine status: Up to date  Covid-19 vaccine status: Information provided on how to obtain vaccines.   Qualifies for Shingles Vaccine? Yes   Zostavax completed No   Shingrix Completed?: No.    Education has been provided regarding the importance of this vaccine. Patient has been advised to call insurance company to determine out of pocket expense if they have not yet received this vaccine. Advised may also receive vaccine at local pharmacy or Health Dept. Verbalized acceptance and understanding.  Screening Tests Health Maintenance  Topic Date Due   Zoster Vaccines- Shingrix (2 of 2) 11/03/2017   DEXA SCAN  Never done   MAMMOGRAM  01/14/2022   Medicare Annual Wellness (AWV)  01/14/2022    COVID-19 Vaccine (3 - Pfizer risk series) 05/14/2022 (Originally 11/07/2019)   COLONOSCOPY (Pts 45-74yrs Insurance coverage will need to be confirmed)  06/18/2022   DTaP/Tdap/Td (2 - Td or Tdap) 09/09/2027   Pneumonia Vaccine 5+ Years old  Completed   INFLUENZA VACCINE  Completed   Hepatitis C Screening  Completed   HPV VACCINES  Aged Out    Health Maintenance  Health Maintenance Due  Topic Date Due   Zoster Vaccines- Shingrix (2 of 2) 11/03/2017   DEXA SCAN  Never done   MAMMOGRAM  01/14/2022   Medicare Annual Wellness (AWV)  01/14/2022    Colorectal cancer screening: Type of screening: Colonoscopy. Completed 2014. Repeat every 10 years  Mammogram status: Ordered 2024. Pt provided with contact info and advised to call to schedule appt.   Bone Density status: Ordered 03/2022. Pt provided with contact info and advised to call to schedule appt.  Lung Cancer Screening: (Low Dose CT Chest recommended if Age 66-80 years, 30 pack-year currently smoking OR have quit w/in 15years.) does not qualify.   Lung Cancer Screening Referral: N/A  Additional Screening:  Hepatitis C Screening: does not qualify; Completed 2022 NEGATIVE  Vision Screening: Recommended annual ophthalmology exams for early detection of glaucoma and other disorders of the eye. Is the patient up to date with their annual eye exam?  No  Who is the provider or what is the name of the office in which the patient attends annual eye exams?  Patient currently does not have a provider- referral placed in today's visit If pt is not established with a provider, would they like to be referred to a provider to establish care? Yes .   Dental Screening: Recommended annual dental exams for proper oral hygiene  Community Resource Referral / Chronic Care Management: CRR required this visit?  No   CCM required this visit?  No      Plan:     I have personally reviewed and noted the following in the patient's chart:   Medical  and social history Use of alcohol, tobacco or illicit drugs  Current medications and supplements including opioid prescriptions. Patient is not currently taking opioid prescriptions. Functional ability and status Nutritional status Physical activity Advanced directives List of other physicians Hospitalizations, surgeries, and ER visits in previous 12 months Vitals Screenings to include cognitive, depression, and falls Referrals and appointments  In addition, I have reviewed and discussed with patient certain preventive protocols, quality metrics, and best practice recommendations. A written personalized care plan for preventive services as well as general preventive health recommendations were provided to patient.     [place], [place]   04/28/2022

## 2022-05-22 ENCOUNTER — Encounter (HOSPITAL_COMMUNITY): Admission: RE | Admit: 2022-05-22 | Payer: Medicare Other | Source: Ambulatory Visit

## 2022-05-22 ENCOUNTER — Telehealth: Payer: Self-pay | Admitting: *Deleted

## 2022-05-22 NOTE — Telephone Encounter (Signed)
-----   Message from Jethro Bolus, RN sent at 05/22/2022  2:41 PM EDT ----- Regarding: No show No answer Patient did not show up for her PAT and unable to reach her by phone.

## 2022-05-25 ENCOUNTER — Ambulatory Visit (HOSPITAL_COMMUNITY): Admission: RE | Admit: 2022-05-25 | Payer: Medicare Other | Source: Home / Self Care

## 2022-05-25 ENCOUNTER — Encounter (HOSPITAL_COMMUNITY): Admission: RE | Payer: Self-pay | Source: Home / Self Care

## 2022-05-25 SURGERY — COLONOSCOPY WITH PROPOFOL
Anesthesia: Monitor Anesthesia Care

## 2022-06-05 NOTE — Progress Notes (Deleted)
Grace Cross, female    DOB: 03/25/1955    MRN: [id number]   Brief patient profile:  40  yobf quit smoking around 2000 due to sob /cough referred to pulmonary clinic in [facility]  04/20/2022 by EDP at [facility] for hypoxemia     History of Present Illness on Trelegy 200 no change vs symbicort  04/20/2022  Pulmonary/ 1st office eval/ Grace Cross / [place] [facility]  Needing clearance for egd/colonsocpy  Chief Complaint  Patient presents with   Consult    Pt consult after ED visit, she reports having drops in oxygen sats. PCP ordered O2 for PRN use and at night. She uses 2-3L. Also experiencing a dry cough > 1 month.  Dyspnea:  [employer] walking s 02 slow pace = MMRC2 = can't walk a nl pace on a flat grade s sob but does fine slow and flat   Cough: clear  and thick  Sleep: bed is flat lots of pillows  SABA use: none  02: 2lpm hs and none during the day  Rec Change to Symbicort 160 Take 2 puffs first thing in am and then another 2 puffs about 12 hours later.   Work on inhaler technique:  Make sure you check your oxygen saturation at your highest level of activity(NOT after you stop)  to be sure it stays over 88% Please schedule a follow up office visit in 6 weeks, call sooner if needed with all medications /inhalers/ solutions in hand    06/06/2022  f/u ov/Grace Cross office/Grace Cross re: *** maint on ***  No chief complaint on file.   Dyspnea:  *** Cough: *** Sleeping: *** SABA use: *** 02: *** Covid status: *** Lung cancer screening: ***   No obvious day to day or daytime variability or assoc excess/ purulent sputum or mucus plugs or hemoptysis or cp or chest tightness, subjective wheeze or overt sinus or hb symptoms.   *** without nocturnal  or early am exacerbation  of respiratory  c/o's or need for noct saba. Also denies any obvious fluctuation of symptoms with weather or environmental changes or other aggravating or alleviating factors except as outlined above   No unusual exposure hx or  h/o childhood pna/ asthma or knowledge of premature birth.  Current Allergies, Complete Past Medical History, Past Surgical History, Family History, and Social History were reviewed in [employer] record.  ROS  The following are not active complaints unless bolded Hoarseness, sore throat, dysphagia, dental problems, itching, sneezing,  nasal congestion or discharge of excess mucus or purulent secretions, ear ache,   fever, chills, sweats, unintended wt loss or wt gain, classically pleuritic or exertional cp,  orthopnea pnd or arm/hand swelling  or leg swelling, presyncope, palpitations, abdominal pain, anorexia, nausea, vomiting, diarrhea  or change in bowel habits or change in bladder habits, change in stools or change in urine, dysuria, hematuria,  rash, arthralgias, visual complaints, headache, numbness, weakness or ataxia or problems with walking or coordination,  change in mood or  memory.        No outpatient medications have been marked as taking for the 06/06/22 encounter (Appointment) with Grace Cowden, MD.                     Past Medical History:  Diagnosis Date   Acid reflux    Asthma    Asthma    Phreesia 05/21/2020   COPD (chronic obstructive pulmonary disease) ([facility])    Depression  Depression    Phreesia 05/21/2020   Hypertension    Hypothyroidism    used to take thyroid medication      Objective:    wts  06/06/2022          ***   04/20/22 101 lb 3.2 oz (45.9 kg)  04/18/22 101 lb 8 oz (46 kg)  03/14/22 99 lb (44.9 kg)     Vital signs reviewed  06/06/2022  - Note at rest 02 sats  ***% on ***   General appearance:    ***   Mild bar***      I personally reviewed images and agree with radiology impression as follows:    CTa 03/13/2022  1. Negative for acute pulmonary embolus. 2. Emphysema without acute airspace disease. Enlarged pulmonary trunk suggesting pulmonary arterial hypertension. 3. Possible mild wall thickening and mucosal  enhancement of the gastric antrum which may be due to gastritis. 4. Multiple bilateral kidney stones without hydronephrosis or ureteral stone. Calcified uterine fibroids. 5. Aortic atherosclerosis.   Assessment

## 2022-06-06 ENCOUNTER — Ambulatory Visit: Payer: Medicare Other | Admitting: Internal Medicine

## 2022-07-18 NOTE — Progress Notes (Deleted)
GI Office Note    Referring Provider: Gilmore Laroche, FNP Primary Care Physician:  Gilmore Laroche, FNP Primary Gastroenterologist: Hennie Duos. Marletta Lor, DO  Date:  07/18/2022  ID:  Grace Cross, DOB 06-13-1955, MRN [id number]   Chief Complaint   No chief complaint on file.  History of Present Illness  Grace Cross is a 67 y.o. female with a history of *** presenting today with complaint of   EGD May 2014: -Schatzki's ring at the GE junction s/p dilation -Moderate nonerosive gastritis with hemorrhage -Continue omeprazole daily -Follow GERD diet -BPE if dysphagia persists.   Colonoscopy May 2014: -Mild diverticulosis in the cecum -Moderate size internal hemorrhoids -Follow high-fiber diet and avoid items that cause bloating -Repeat colonoscopy in 10 years   Last office visit 04/18/17.  Having trouble swallowing twice a week or more.  Had been on Viberzi which worked well in controlling diarrhea however reported it was too expensive.  Had restarted Bentyl when diarrhea returned.  Triggers to diarrhea/loose stools including heavy greasy/fried foods.  Having burning in her chest after eating and feels like food is stuck.  Advised to follow mechanical soft/low FODMAP diet.  Scheduled for EGD with dilation with propofol.  Viberzi reordered.   ED visit 03/13/2022 for abdominal pain.  Reported laying in bed and had abdominal pain after she coughed to her right lower abdomen.  Pain migrated up to her right upper quadrant and subxiphoid area.  Reported pain persisted throughout the night.  Had 4 hours of pain relief then pain returned.  No to be hypertensive and tachycardic as well as hypoxic at urgent care.  At time of ED visit her abdominal pain had improved but still had some mild soreness.  Last bowel movement that morning.  Denied any nausea or vomiting.  She was given magnesium replacement, made pain medication with fentanyl, IV hydration with 1 L of LR, famotidine, GI cocktail.  She had  laboratory evidence of hypomagnesemia as well as UTI.  She did admit to urinary frequency.  Patient had resolution of discomfort with GI cocktail.  She did admit to recent ibuprofen use.  She was advised to take omeprazole daily as well as Carafate was also prescribed.  Given Keflex for UTI.  Advised follow-up care with pulmonology.  She underwent CTA chest as well as CT abdomen pelvis as noted below.   CT A/P and CTA chest 03/13/22: -Negative for acute PE -Emphysema without acute airspace disease, enlarged pulmonary trunk suggesting pulmonary arterial hypertension -Possible mild wall thickening and mucosal enhancement of gastric antrum possibly secondary to gastritis -Multiple bilateral kidney stones without hydronephrosis or ureteral stone, calcified uterine fibroids -Contracted gallbladder, no cholelithiasis or hepatic abnormality.  Last office visit 04/18/2022. *** Scheduled for EGD with dilation and colonoscopy.  Advise continue omeprazole 40 mg once daily.  GERD diet reinforced.  Advised to finish supply of Carafate and follow-up in 3 months.  Patient was scheduled for EGD on 4/25 however no-show to her preop appointment.  Today:    Current Outpatient Medications  Medication Sig Dispense Refill   acetaminophen (TYLENOL) 500 MG tablet Take 500 mg by mouth every 6 (six) hours as needed.     budesonide-formoterol (SYMBICORT) 160-4.5 MCG/ACT inhaler Inhale 2 puffs into the lungs 2 (two) times daily.     citalopram (CELEXA) 20 MG tablet Take 1 tablet (20 mg total) by mouth daily. 90 tablet 2   fluticasone (FLONASE) 50 MCG/ACT nasal spray Place 2 sprays into both nostrils daily. 16  g 6   Fluticasone-Umeclidin-Vilant (TRELEGY ELLIPTA) 100-62.5-25 MCG/ACT AEPB Inhale 1 puff into the lungs daily. 1 each 0   Multiple Vitamins-Minerals (CENTRUM SILVER PO) Take by mouth.     omeprazole (PRILOSEC) 40 MG capsule Take 1 capsule (40 mg total) by mouth daily. 30 capsule 2   sucralfate (CARAFATE) 1  GM/10ML suspension Take 10 mLs (1 g total) by mouth with breakfast, with lunch, and with evening meal. 900 mL 0   No current facility-administered medications for this visit.    Past Medical History:  Diagnosis Date   Acid reflux    Asthma    Asthma    Phreesia 05/21/2020   COPD (chronic obstructive pulmonary disease) (HCC)    Depression    Depression    Phreesia 05/21/2020   Hypertension    Hypothyroidism    used to take thyroid medication    Past Surgical History:  Procedure Laterality Date   COLONOSCOPY, ESOPHAGOGASTRODUODENOSCOPY (EGD) AND ESOPHAGEAL DILATION N/A 06/17/2012   ZOX:WRUE IN CECUM/Mod IH/ESO ring/Gastritis    Family History  Problem Relation Age of Onset   Asthma Mother    Heart attack Father    Congestive Heart Failure Father    Diabetes Sister    Hyperlipidemia Brother    Hypertension Brother    Asthma Brother    Hypertension Brother    Colon cancer Neg Hx    Inflammatory bowel disease Neg Hx     Allergies as of 07/19/2022 - Review Complete 04/28/2022  Allergen Reaction Noted   Penicillins Rash 03/17/2011    Social History   Socioeconomic History   Marital status: Single    Spouse name: Not on file   Number of children: 1   Years of education: Not on file   Highest education level: Not on file  Occupational History   Occupation: unemployed  Tobacco Use   Smoking status: Former    Packs/day: 0.50    Years: 22.00    Additional pack years: 0.00    Total pack years: 11.00    Types: Cigarettes    Quit date: 01/31/1995    Years since quitting: 27.4   Smokeless tobacco: Never  Vaping Use   Vaping Use: Never used  Substance and Sexual Activity   Alcohol use: Not Currently    Comment: occ 1-2 beer   Drug use: No   Sexual activity: Not Currently    Birth control/protection: Post-menopausal  Other Topics Concern   Not on file  Social History Narrative   Daughter present with her today   Social Determinants of Health   Financial  Resource Strain: Medium Risk (04/28/2022)   Overall Financial Resource Strain (CARDIA)    Difficulty of Paying Living Expenses: Somewhat hard  Food Insecurity: Food Insecurity Present (04/28/2022)   Hunger Vital Sign    Worried About Running Out of Food in the Last Year: Often true    Ran Out of Food in the Last Year: Often true  Transportation Needs: No Transportation Needs (04/28/2022)   PRAPARE - Administrator, Civil Service (Medical): No    Lack of Transportation (Non-Medical): No  Physical Activity: Inactive (04/28/2022)   Exercise Vital Sign    Days of Exercise per Week: 0 days    Minutes of Exercise per Session: 0 min  Stress: Stress Concern Present (04/28/2022)   [employer] of Occupational Health - Occupational Stress Questionnaire    Feeling of Stress : To some extent  Social Connections: Unknown (04/28/2022)   Social  Connection and Isolation Panel [NHANES]    Frequency of Communication with Friends and Family: Three times a week    Frequency of Social Gatherings with Friends and Family: Once a week    Attends Religious Services: Never    Database administrator or Organizations: No    Attends Engineer, structural: Never    Marital Status: Patient declined     Review of Systems   Gen: Denies fever, chills, anorexia. Denies fatigue, weakness, weight loss.  CV: Denies chest pain, palpitations, syncope, peripheral edema, and claudication. Resp: Denies dyspnea at rest, cough, wheezing, coughing up blood, and pleurisy. GI: See HPI Derm: Denies rash, itching, dry skin Psych: Denies depression, anxiety, memory loss, confusion. No homicidal or suicidal ideation.  Heme: Denies bruising, bleeding, and enlarged lymph nodes.   Physical Exam   There were no vitals taken for this visit.  General:   Alert and oriented. No distress noted. Pleasant and cooperative.  Head:  Normocephalic and atraumatic. Eyes:  Conjuctiva clear without scleral icterus. Mouth:   Oral mucosa pink and moist. Good dentition. No lesions. Lungs:  Clear to auscultation bilaterally. No wheezes, rales, or rhonchi. No distress.  Heart:  S1, S2 present without murmurs appreciated.  Abdomen:  +BS, soft, non-tender and non-distended. No rebound or guarding. No HSM or masses noted. Rectal: *** Msk:  Symmetrical without gross deformities. Normal posture. Extremities:  Without edema. Neurologic:  Alert and  oriented x4 Psych:  Alert and cooperative. Normal mood and affect.   Assessment  Grace Cross is a 67 y.o. female with a history of *** presenting today with   GERD, dysphagia, epigastric pain:  Diarrhea:  Screening for colon cancer:    PLAN   ***     Brooke Bonito, MSN, FNP-BC, AGACNP-BC [facility]

## 2022-07-19 ENCOUNTER — Ambulatory Visit: Payer: Medicare Other | Admitting: Gastroenterology

## 2022-07-25 ENCOUNTER — Encounter: Payer: Self-pay | Admitting: Gastroenterology

## 2022-07-28 ENCOUNTER — Ambulatory Visit: Payer: Medicare Other | Admitting: Family Medicine

## 2022-08-07 NOTE — Progress Notes (Unsigned)
GI Office Note    Referring Provider: Gilmore Laroche, FNP Primary Care Physician:  Gilmore Laroche, FNP  Primary Gastroenterologist: Hennie Duos. Marletta Lor, DO   Chief Complaint   No chief complaint on file.   History of Present Illness   Grace Cross is a 67 y.o. female presenting today    EGD May 2014: -Schatzki's ring at the GE junction s/p dilation -Moderate nonerosive gastritis with hemorrhage -Continue omeprazole daily -Follow GERD diet -BPE if dysphagia persists.   Colonoscopy May 2014: -Mild diverticulosis in the cecum -Moderate size internal hemorrhoids -Follow high-fiber diet and avoid items that cause bloating -Repeat colonoscopy in 10 years  CT A/P and CTA chest 03/13/22: -Negative for acute PE -Emphysema without acute airspace disease, enlarged pulmonary trunk suggesting pulmonary arterial hypertension -Possible mild wall thickening and mucosal enhancement of gastric antrum possibly secondary to gastritis -Multiple bilateral kidney stones without hydronephrosis or ureteral stone, calcified uterine fibroids -Contracted gallbladder, no cholelithiasis or hepatic abnormality.    Did not show for pre-op appt earlier this year for colonoscopy and upper endoscopy so her procedures had to be cancelled.   Medications   Current Outpatient Medications  Medication Sig Dispense Refill   acetaminophen (TYLENOL) 500 MG tablet Take 500 mg by mouth every 6 (six) hours as needed.     budesonide-formoterol (SYMBICORT) 160-4.5 MCG/ACT inhaler Inhale 2 puffs into the lungs 2 (two) times daily.     citalopram (CELEXA) 20 MG tablet Take 1 tablet (20 mg total) by mouth daily. 90 tablet 2   fluticasone (FLONASE) 50 MCG/ACT nasal spray Place 2 sprays into both nostrils daily. 16 g 6   Fluticasone-Umeclidin-Vilant (TRELEGY ELLIPTA) 100-62.5-25 MCG/ACT AEPB Inhale 1 puff into the lungs daily. 1 each 0   Multiple Vitamins-Minerals (CENTRUM SILVER PO) Take by mouth.      omeprazole (PRILOSEC) 40 MG capsule Take 1 capsule (40 mg total) by mouth daily. 30 capsule 2   sucralfate (CARAFATE) 1 GM/10ML suspension Take 10 mLs (1 g total) by mouth with breakfast, with lunch, and with evening meal. 900 mL 0   No current facility-administered medications for this visit.    Allergies   Allergies as of 08/08/2022 - Review Complete 04/28/2022  Allergen Reaction Noted   Penicillins Rash 03/17/2011     Past Medical History   Past Medical History:  Diagnosis Date   Acid reflux    Asthma    Asthma    Phreesia 05/21/2020   COPD (chronic obstructive pulmonary disease) (HCC)    Depression    Depression    Phreesia 05/21/2020   Hypertension    Hypothyroidism    used to take thyroid medication    Past Surgical History   Past Surgical History:  Procedure Laterality Date   COLONOSCOPY, ESOPHAGOGASTRODUODENOSCOPY (EGD) AND ESOPHAGEAL DILATION N/A 06/17/2012   BJY:NWGN IN CECUM/Mod IH/ESO ring/Gastritis    Past Family History   Family History  Problem Relation Age of Onset   Asthma Mother    Heart attack Father    Congestive Heart Failure Father    Diabetes Sister    Hyperlipidemia Brother    Hypertension Brother    Asthma Brother    Hypertension Brother    Colon cancer Neg Hx    Inflammatory bowel disease Neg Hx     Past Social History   Social History   Socioeconomic History   Marital status: Single    Spouse name: Not on file   Number of children: 1  Years of education: Not on file   Highest education level: Not on file  Occupational History   Occupation: unemployed  Tobacco Use   Smoking status: Former    Packs/day: 0.50    Years: 22.00    Additional pack years: 0.00    Total pack years: 11.00    Types: Cigarettes    Quit date: 01/31/1995    Years since quitting: 27.5   Smokeless tobacco: Never  Vaping Use   Vaping Use: Never used  Substance and Sexual Activity   Alcohol use: Not Currently    Comment: occ 1-2 beer   Drug use:  No   Sexual activity: Not Currently    Birth control/protection: Post-menopausal  Other Topics Concern   Not on file  Social History Narrative   Daughter present with her today   Social Determinants of Health   Financial Resource Strain: Medium Risk (04/28/2022)   Overall Financial Resource Strain (CARDIA)    Difficulty of Paying Living Expenses: Somewhat hard  Food Insecurity: Food Insecurity Present (04/28/2022)   Hunger Vital Sign    Worried About Running Out of Food in the Last Year: Often true    Ran Out of Food in the Last Year: Often true  Transportation Needs: No Transportation Needs (04/28/2022)   PRAPARE - Administrator, Civil Service (Medical): No    Lack of Transportation (Non-Medical): No  Physical Activity: Inactive (04/28/2022)   Exercise Vital Sign    Days of Exercise per Week: 0 days    Minutes of Exercise per Session: 0 min  Stress: Stress Concern Present (04/28/2022)   [employer] of Occupational Health - Occupational Stress Questionnaire    Feeling of Stress : To some extent  Social Connections: Unknown (04/28/2022)   Social Connection and Isolation Panel [NHANES]    Frequency of Communication with Friends and Family: Three times a week    Frequency of Social Gatherings with Friends and Family: Once a week    Attends Religious Services: Never    Database administrator or Organizations: No    Attends Banker Meetings: Never    Marital Status: Patient declined  Catering manager Violence: Not At Risk (04/28/2022)   Humiliation, Afraid, Rape, and Kick questionnaire    Fear of Current or Ex-Partner: No    Emotionally Abused: No    Physically Abused: No    Sexually Abused: No    Review of Systems   General: Negative for anorexia, weight loss, fever, chills, fatigue, weakness. ENT: Negative for hoarseness, difficulty swallowing , nasal congestion. CV: Negative for chest pain, angina, palpitations, dyspnea on exertion, peripheral  edema.  Respiratory: Negative for dyspnea at rest, dyspnea on exertion, cough, sputum, wheezing.  GI: See history of present illness. GU:  Negative for dysuria, hematuria, urinary incontinence, urinary frequency, nocturnal urination.  Endo: Negative for unusual weight change.     Physical Exam   There were no vitals taken for this visit.   General: Well-nourished, well-developed in no acute distress.  Eyes: No icterus. Mouth: Oropharyngeal mucosa moist and pink , no lesions erythema or exudate. Lungs: Clear to auscultation bilaterally.  Heart: Regular rate and rhythm, no murmurs rubs or gallops.  Abdomen: Bowel sounds are normal, nontender, nondistended, no hepatosplenomegaly or masses,  no abdominal bruits or hernia , no rebound or guarding.  Rectal: ***  Extremities: No lower extremity edema. No clubbing or deformities. Neuro: Alert and oriented x 4   Skin: Warm and dry, no jaundice.  Psych: Alert and cooperative, normal mood and affect.  Labs   Lab Results  Component Value Date   CREATININE 1.39 (H) 03/13/2022   BUN 24 (H) 03/13/2022   NA 134 (L) 03/13/2022   K 3.9 03/13/2022   CL 102 03/13/2022   CO2 24 03/13/2022   Lab Results  Component Value Date   ALT 17 03/13/2022   AST 27 03/13/2022   ALKPHOS 90 03/13/2022   BILITOT 0.2 (L) 03/13/2022   Lab Results  Component Value Date   WBC 4.8 03/13/2022   HGB 11.2 (L) 03/13/2022   HCT 34.8 (L) 03/13/2022   MCV 94.8 03/13/2022   PLT 244 03/13/2022   Lab Results  Component Value Date   LIPASE 59 (H) 03/13/2022   Lab Results  Component Value Date   HGBA1C 5.2 11/11/2019    Imaging Studies   No results found.  Assessment       PLAN   ***   Leanna Battles. Melvyn Neth, MHS, PA-C [facility]

## 2022-08-08 ENCOUNTER — Ambulatory Visit (INDEPENDENT_AMBULATORY_CARE_PROVIDER_SITE_OTHER): Payer: Medicare Other | Admitting: Gastroenterology

## 2022-08-08 ENCOUNTER — Encounter: Payer: Self-pay | Admitting: Gastroenterology

## 2022-08-08 VITALS — BP 140/90 | HR 84 | Temp 97.9°F | Ht 61.0 in | Wt 95.4 lb

## 2022-08-08 DIAGNOSIS — R1319 Other dysphagia: Secondary | ICD-10-CM | POA: Diagnosis not present

## 2022-08-08 DIAGNOSIS — R1013 Epigastric pain: Secondary | ICD-10-CM

## 2022-08-08 DIAGNOSIS — R634 Abnormal weight loss: Secondary | ICD-10-CM

## 2022-08-08 DIAGNOSIS — K219 Gastro-esophageal reflux disease without esophagitis: Secondary | ICD-10-CM

## 2022-08-08 DIAGNOSIS — K580 Irritable bowel syndrome with diarrhea: Secondary | ICD-10-CM | POA: Diagnosis not present

## 2022-08-08 MED ORDER — SUCRALFATE 1 GM/10ML PO SUSP: 1.0000 g | Freq: Three times a day (TID) | ORAL | 1 refills | Status: DC

## 2022-08-08 NOTE — Patient Instructions (Addendum)
Restart carafate 1 gram with meals. Continue omeprazole 40mg  daily before breakfast. We will schedule you for a colonoscopy and upper endoscopy.

## 2022-08-13 ENCOUNTER — Other Ambulatory Visit: Payer: Self-pay | Admitting: Family Medicine

## 2022-08-13 DIAGNOSIS — K219 Gastro-esophageal reflux disease without esophagitis: Secondary | ICD-10-CM

## 2022-08-17 ENCOUNTER — Encounter: Payer: Self-pay | Admitting: *Deleted

## 2022-08-17 ENCOUNTER — Other Ambulatory Visit: Payer: Self-pay | Admitting: *Deleted

## 2022-08-17 MED ORDER — PEG 3350-KCL-NA BICARB-NACL 420 G PO SOLR: 4000.0000 mL | Freq: Once | ORAL | 0 refills | Status: AC

## 2022-09-09 ENCOUNTER — Other Ambulatory Visit: Payer: Self-pay | Admitting: Family Medicine

## 2022-09-09 DIAGNOSIS — K219 Gastro-esophageal reflux disease without esophagitis: Secondary | ICD-10-CM

## 2022-09-12 NOTE — Patient Instructions (Signed)
Grace Cross  09/12/2022     @PREFPERIOPPHARMACY @   Your procedure is scheduled on  09/15/2022.   Report to [facility] at  1030  A.M.   Call this number if you have problems the morning of surgery:  [phone]  If you experience any cold or flu symptoms such as cough, fever, chills, shortness of breath, etc. between now and your scheduled surgery, please notify us at the above number.   Remember:  Follow the diet and prep instructions given to you by the office.      Use your inhalers before you come and bring your rescue inhaler with you.     Take these medicines the morning of surgery with A SIP OF WATER                                       celexa, omeprazole.     Do not wear jewelry, make-up or nail polish, including gel polish,  artificial nails, or any other type of covering on natural nails (fingers and  toes).  Do not wear lotions, powders, or perfumes, or deodorant.  Do not shave 48 hours prior to surgery.  Men may shave face and neck.  Do not bring valuables to the hospital.  [facility] is not responsible for any belongings or valuables.  Contacts, dentures or bridgework may not be worn into surgery.  Leave your suitcase in the car.  After surgery it may be brought to your room.  For patients admitted to the hospital, discharge time will be determined by your treatment team.  Patients discharged the day of surgery will not be allowed to drive home and must have someone with them for 24 hours.    Special instructions:   DO NOT smoke tobacco or vape for 24 hours before your procedure.  Please read over the following fact sheets that you were given. Anesthesia Post-op Instructions and Care and Recovery After Surgery        Upper Endoscopy, Adult, Care After After the procedure, it is common to have a sore throat. It is also common to have: Mild stomach pain or discomfort. Bloating. Nausea. Follow these instructions at home: The  instructions below may help you care for yourself at home. Your health care provider may give you more instructions. If you have questions, ask your health care provider. If you were given a sedative during the procedure, it can affect you for several hours. Do not drive or operate machinery until your health care provider says that it is safe. If you will be going home right after the procedure, plan to have a responsible adult: Take you home from the hospital or clinic. You will not be allowed to drive. Care for you for the time you are told. Follow instructions from your health care provider about what you may eat and drink. Return to your normal activities as told by your health care provider. Ask your health care provider what activities are safe for you. Take over-the-counter and prescription medicines only as told by your health care provider. Contact a health care provider if you: Have a sore throat that lasts longer than one day. Have trouble swallowing. Have a fever. Get help right away if you: Vomit blood or your vomit looks like coffee grounds. Have bloody, black, or tarry stools. Have a very bad sore throat or  you cannot swallow. Have difficulty breathing or very bad pain in your chest or abdomen. These symptoms may be an emergency. Get help right away. Call 911. Do not wait to see if the symptoms will go away. Do not drive yourself to the hospital. Summary After the procedure, it is common to have a sore throat, mild stomach discomfort, bloating, and nausea. If you were given a sedative during the procedure, it can affect you for several hours. Do not drive until your health care provider says that it is safe. Follow instructions from your health care provider about what you may eat and drink. Return to your normal activities as told by your health care provider. This information is not intended to replace advice given to you by your health care provider. Make sure you discuss  any questions you have with your health care provider. Document Revised: 04/27/2021 Document Reviewed: 04/27/2021 Elsevier Patient Education  2024 Elsevier Inc. Esophageal Dilatation Esophageal dilatation, also called esophageal dilation, is a procedure to widen or open a blocked or narrowed part of the esophagus. The esophagus is the part of the body that moves food and liquid from the mouth to the stomach. You may need this procedure if: You have a buildup of scar tissue in your esophagus that makes it difficult, painful, or impossible to swallow. This can be caused by gastroesophageal reflux disease (GERD). You have cancer of the esophagus. There is a problem with how food moves through your esophagus. In some cases, you may need this procedure repeated at a later time to dilate the esophagus gradually. Tell a health care provider about: Any allergies you have. All medicines you are taking, including vitamins, herbs, eye drops, creams, and over-the-counter medicines. Any problems you or family members have had with anesthetic medicines. Any blood disorders you have. Any surgeries you have had. Any medical conditions you have. Any antibiotic medicines you are required to take before dental procedures. Whether you are pregnant or may be pregnant. What are the risks? Generally, this is a safe procedure. However, problems may occur, including: Bleeding due to a tear in the lining of the esophagus. A hole, or perforation, in the esophagus. What happens before the procedure? Ask your health care provider about: Changing or stopping your regular medicines. This is especially important if you are taking diabetes medicines or blood thinners. Taking medicines such as aspirin and ibuprofen. These medicines can thin your blood. Do not take these medicines unless your health care provider tells you to take them. Taking over-the-counter medicines, vitamins, herbs, and supplements. Follow  instructions from your health care provider about eating or drinking restrictions. Plan to have a responsible adult take you home from the hospital or clinic. Plan to have a responsible adult care for you for the time you are told after you leave the hospital or clinic. This is important. What happens during the procedure? You may be given a medicine to help you relax (sedative). A numbing medicine may be sprayed into the back of your throat, or you may gargle the medicine. Your health care provider may perform the dilatation using various surgical instruments, such as: Simple dilators. This instrument is carefully placed in the esophagus to stretch it. Guided wire bougies. This involves using an endoscope to insert a wire into the esophagus. A dilator is passed over this wire to enlarge the esophagus. Then the wire is removed. Balloon dilators. An endoscope with a small balloon is inserted into the esophagus. The balloon is inflated  to stretch the esophagus and open it up. The procedure may vary among health care providers and hospitals. What can I expect after the procedure? Your blood pressure, heart rate, breathing rate, and blood oxygen level will be monitored until you leave the hospital or clinic. Your throat may feel slightly sore and numb. This will get better over time. You will not be allowed to eat or drink until your throat is no longer numb. When you are able to drink, urinate, and sit on the edge of the bed without nausea or dizziness, you may be able to return home. Follow these instructions at home: Take over-the-counter and prescription medicines only as told by your health care provider. If you were given a sedative during the procedure, it can affect you for several hours. Do not drive or operate machinery until your health care provider says that it is safe. Plan to have a responsible adult care for you for the time you are told. This is important. Follow instructions from  your health care provider about any eating or drinking restrictions. Do not use any products that contain nicotine or tobacco, such as cigarettes, e-cigarettes, and chewing tobacco. If you need help quitting, ask your health care provider. Keep all follow-up visits. This is important. Contact a health care provider if: You have a fever. You have pain that is not relieved by medicine. Get help right away if: You have chest pain. You have trouble breathing. You have trouble swallowing. You vomit blood. You have black, tarry, or bloody stools. These symptoms may represent a serious problem that is an emergency. Do not wait to see if the symptoms will go away. Get medical help right away. Call your local emergency services (911 in the [place] Do not drive yourself to the hospital. Summary Esophageal dilatation, also called esophageal dilation, is a procedure to widen or open a blocked or narrowed part of the esophagus. Plan to have a responsible adult take you home from the hospital or clinic. For this procedure, a numbing medicine may be sprayed into the back of your throat, or you may gargle the medicine. Do not drive or operate machinery until your health care provider says that it is safe. This information is not intended to replace advice given to you by your health care provider. Make sure you discuss any questions you have with your health care provider. Document Revised: 06/04/2019 Document Reviewed: 06/04/2019 Elsevier Patient Education  2024 Elsevier Inc. Colonoscopy, Adult, Care After The following information offers guidance on how to care for yourself after your procedure. Your health care provider may also give you more specific instructions. If you have problems or questions, contact your health care provider. What can I expect after the procedure? After the procedure, it is common to have: A small amount of blood in your stool for 24 hours after the procedure. Some gas. Mild  cramping or bloating of your abdomen. Follow these instructions at home: Eating and drinking  Drink enough fluid to keep your urine pale yellow. Follow instructions from your health care provider about eating or drinking restrictions. Resume your normal diet as told by your health care provider. Avoid heavy or fried foods that are hard to digest. Activity Rest as told by your health care provider. Avoid sitting for a long time without moving. Get up to take short walks every 1-2 hours. This is important to improve blood flow and breathing. Ask for help if you feel weak or unsteady. Return to your normal  activities as told by your health care provider. Ask your health care provider what activities are safe for you. Managing cramping and bloating  Try walking around when you have cramps or feel bloated. If directed, apply heat to your abdomen as told by your health care provider. Use the heat source that your health care provider recommends, such as a moist heat pack or a heating pad. Place a towel between your skin and the heat source. Leave the heat on for 20-30 minutes. Remove the heat if your skin turns bright red. This is especially important if you are unable to feel pain, heat, or cold. You have a greater risk of getting burned. General instructions If you were given a sedative during the procedure, it can affect you for several hours. Do not drive or operate machinery until your health care provider says that it is safe. For the first 24 hours after the procedure: Do not sign important documents. Do not drink alcohol. Do your regular daily activities at a slower pace than normal. Eat soft foods that are easy to digest. Take over-the-counter and prescription medicines only as told by your health care provider. Keep all follow-up visits. This is important. Contact a health care provider if: You have blood in your stool 2-3 days after the procedure. Get help right away if: You have  more than a small spotting of blood in your stool. You have large blood clots in your stool. You have swelling of your abdomen. You have nausea or vomiting. You have a fever. You have increasing pain in your abdomen that is not relieved with medicine. These symptoms may be an emergency. Get help right away. Call 911. Do not wait to see if the symptoms will go away. Do not drive yourself to the hospital. Summary After the procedure, it is common to have a small amount of blood in your stool. You may also have mild cramping and bloating of your abdomen. If you were given a sedative during the procedure, it can affect you for several hours. Do not drive or operate machinery until your health care provider says that it is safe. Get help right away if you have a lot of blood in your stool, nausea or vomiting, a fever, or increased pain in your abdomen. This information is not intended to replace advice given to you by your health care provider. Make sure you discuss any questions you have with your health care provider. Document Revised: 02/28/2022 Document Reviewed: 09/08/2020 Elsevier Patient Education  2024 Elsevier Inc. Monitored Anesthesia Care, Care After The following information offers guidance on how to care for yourself after your procedure. Your health care provider may also give you more specific instructions. If you have problems or questions, contact your health care provider. What can I expect after the procedure? After the procedure, it is common to have: Tiredness. Little or no memory about what happened during or after the procedure. Impaired judgment when it comes to making decisions. Nausea or vomiting. Some trouble with balance. Follow these instructions at home: For the time period you were told by your health care provider:  Rest. Do not participate in activities where you could fall or become injured. Do not drive or use machinery. Do not drink alcohol. Do not take  sleeping pills or medicines that cause drowsiness. Do not make important decisions or sign legal documents. Do not take care of children on your own. Medicines Take over-the-counter and prescription medicines only as told by your health  care provider. If you were prescribed antibiotics, take them as told by your health care provider. Do not stop using the antibiotic even if you start to feel better. Eating and drinking Follow instructions from your health care provider about what you may eat and drink. Drink enough fluid to keep your urine pale yellow. If you vomit: Drink clear fluids slowly and in small amounts as you are able. Clear fluids include water, ice chips, low-calorie sports drinks, and fruit juice that has water added to it (diluted fruit juice). Eat light and bland foods in small amounts as you are able. These foods include bananas, applesauce, rice, lean meats, toast, and crackers. General instructions  Have a responsible adult stay with you for the time you are told. It is important to have someone help care for you until you are awake and alert. If you have sleep apnea, surgery and some medicines can increase your risk for breathing problems. Follow instructions from your health care provider about wearing your sleep device: When you are sleeping. This includes during daytime naps. While taking prescription pain medicines, sleeping medicines, or medicines that make you drowsy. Do not use any products that contain nicotine or tobacco. These products include cigarettes, chewing tobacco, and vaping devices, such as e-cigarettes. If you need help quitting, ask your health care provider. Contact a health care provider if: You feel nauseous or vomit every time you eat or drink. You feel light-headed. You are still sleepy or having trouble with balance after 24 hours. You get a rash. You have a fever. You have redness or swelling around the IV site. Get help right away if: You  have trouble breathing. You have new confusion after you get home. These symptoms may be an emergency. Get help right away. Call 911. Do not wait to see if the symptoms will go away. Do not drive yourself to the hospital. This information is not intended to replace advice given to you by your health care provider. Make sure you discuss any questions you have with your health care provider. Document Revised: 06/13/2021 Document Reviewed: 06/13/2021 Elsevier Patient Education  2024 [employer].

## 2022-09-13 ENCOUNTER — Encounter (HOSPITAL_COMMUNITY): Payer: Self-pay

## 2022-09-13 ENCOUNTER — Encounter (HOSPITAL_COMMUNITY)
Admission: RE | Admit: 2022-09-13 | Discharge: 2022-09-13 | Disposition: A | Discharge status: Home / Self Care | Payer: Medicare Other | Source: Ambulatory Visit | Attending: Internal Medicine | Admitting: Internal Medicine

## 2022-09-13 VITALS — BP 87/69 | HR 82 | Temp 97.8°F | Resp 18 | Ht 61.0 in | Wt 95.5 lb

## 2022-09-13 DIAGNOSIS — I10 Essential (primary) hypertension: Secondary | ICD-10-CM

## 2022-09-13 DIAGNOSIS — Z01810 Encounter for preprocedural cardiovascular examination: Secondary | ICD-10-CM | POA: Diagnosis not present

## 2022-09-13 DIAGNOSIS — Z01818 Encounter for other preprocedural examination: Secondary | ICD-10-CM | POA: Diagnosis present

## 2022-09-13 HISTORY — DX: Dependence on supplemental oxygen: Z99.81

## 2022-09-14 ENCOUNTER — Encounter (HOSPITAL_COMMUNITY): Payer: Self-pay | Admitting: Anesthesiology

## 2022-09-14 ENCOUNTER — Telehealth: Payer: Self-pay | Admitting: Internal Medicine

## 2022-09-14 NOTE — Telephone Encounter (Signed)
Called pt, went straight to VM but VM not set up

## 2022-09-14 NOTE — Telephone Encounter (Signed)
Called pt again, went straight to VM, not able to leave VM

## 2022-09-14 NOTE — Telephone Encounter (Signed)
Message was left for a call back re: surgery.  That was all the message stated.

## 2022-09-15 ENCOUNTER — Encounter (HOSPITAL_COMMUNITY): Admission: RE | Payer: Self-pay | Source: Home / Self Care

## 2022-09-15 ENCOUNTER — Ambulatory Visit (HOSPITAL_COMMUNITY): Admission: RE | Admit: 2022-09-15 | Payer: Medicare Other | Source: Home / Self Care

## 2022-09-15 DIAGNOSIS — K589 Irritable bowel syndrome without diarrhea: Secondary | ICD-10-CM

## 2022-09-15 DIAGNOSIS — R1319 Other dysphagia: Secondary | ICD-10-CM

## 2022-09-15 DIAGNOSIS — K219 Gastro-esophageal reflux disease without esophagitis: Secondary | ICD-10-CM

## 2022-09-15 DIAGNOSIS — R1013 Epigastric pain: Secondary | ICD-10-CM

## 2022-09-15 SURGERY — COLONOSCOPY WITH PROPOFOL
Anesthesia: Monitor Anesthesia Care

## 2022-09-22 ENCOUNTER — Encounter: Payer: Self-pay | Admitting: Family Medicine

## 2022-09-22 ENCOUNTER — Ambulatory Visit (INDEPENDENT_AMBULATORY_CARE_PROVIDER_SITE_OTHER): Payer: Medicare Other | Admitting: Family Medicine

## 2022-09-22 VITALS — BP 103/73 | HR 91 | Ht 61.0 in | Wt 95.0 lb

## 2022-09-22 DIAGNOSIS — E038 Other specified hypothyroidism: Secondary | ICD-10-CM

## 2022-09-22 DIAGNOSIS — E7849 Other hyperlipidemia: Secondary | ICD-10-CM

## 2022-09-22 DIAGNOSIS — R7301 Impaired fasting glucose: Secondary | ICD-10-CM

## 2022-09-22 DIAGNOSIS — E559 Vitamin D deficiency, unspecified: Secondary | ICD-10-CM

## 2022-09-22 DIAGNOSIS — R21 Rash and other nonspecific skin eruption: Secondary | ICD-10-CM

## 2022-09-22 NOTE — Progress Notes (Signed)
Established Patient Office Visit  Subjective:  Patient ID: Grace Cross, female    DOB: 21-Nov-1955  Age: 67 y.o. MRN: [id number]  CC:  Chief Complaint  Patient presents with   Follow-up    Requesting letter stating niece is caregiver, rash on arms    HPI ZEA Cross is a 67 y.o. female with past medical history of GERD, depression, and rash presents for f/u of  chronic medical conditions. For the details of today's visit, please refer to the assessment and plan.     Past Medical History:  Diagnosis Date   Acid reflux    Asthma    Asthma    Phreesia 05/21/2020   COPD (chronic obstructive pulmonary disease) (HCC)    Depression    Depression    [employer]reesia 05/21/2020   Hypertension    Hypothyroidism    used to take thyroid medication   Oxygen dependent    1-2l/m [place] at night and PRN    Past Surgical History:  Procedure Laterality Date   COLONOSCOPY, ESOPHAGOGASTRODUODENOSCOPY (EGD) AND ESOPHAGEAL DILATION N/A 06/17/2012   NWG:NFAO IN CECUM/Mod IH/ESO ring/Gastritis    Family History  Problem Relation Age of Onset   Asthma Mother    Heart attack Father    Congestive Heart Failure Father    Diabetes Sister    Hyperlipidemia Brother    Hypertension Brother    Asthma Brother    Hypertension Brother    Colon cancer Neg Hx    Inflammatory bowel disease Neg Hx     Social History   Socioeconomic History   Marital status: Single    Spouse name: Not on file   Number of children: 1   Years of education: Not on file   Highest education level: Not on file  Occupational History   Occupation: unemployed  Tobacco Use   Smoking status: Former    Current packs/day: 0.00    Average packs/day: 0.5 packs/day for 22.0 years (11.0 ttl pk-yrs)    Types: Cigarettes    Start date: 01/30/1973    Quit date: 01/31/1995    Years since quitting: 27.6   Smokeless tobacco: Never  Vaping Use   Vaping status: Never Used  Substance and Sexual Activity   Alcohol use: Not Currently     Comment: pt denies   Drug use: No   Sexual activity: Not Currently    Birth control/protection: Post-menopausal  Other Topics Concern   Not on file  Social History Narrative   Daughter present with her today   Social Determinants of Health   Financial Resource Strain: Medium Risk (04/28/2022)   Overall Financial Resource Strain (CARDIA)    Difficulty of Paying Living Expenses: Somewhat hard  Food Insecurity: Food Insecurity Present (04/28/2022)   Hunger Vital Sign    Worried About Running Out of Food in the Last Year: Often true    Ran Out of Food in the Last Year: Often true  Transportation Needs: No Transportation Needs (04/28/2022)   PRAPARE - Administrator, Civil Service (Medical): No    Lack of Transportation (Non-Medical): No  Physical Activity: Inactive (04/28/2022)   Exercise Vital Sign    Days of Exercise per Week: 0 days    Minutes of Exercise per Session: 0 min  Stress: Stress Concern Present (04/28/2022)   [employer] of Occupational Health - Occupational Stress Questionnaire    Feeling of Stress : To some extent  Social Connections: Unknown (04/28/2022)   Social Connection and  Isolation Panel [NHANES]    Frequency of Communication with Friends and Family: Three times a week    Frequency of Social Gatherings with Friends and Family: Once a week    Attends Religious Services: Never    Database administrator or Organizations: No    Attends Banker Meetings: Never    Marital Status: Patient declined  Catering manager Violence: Not At Risk (04/28/2022)   Humiliation, Afraid, Rape, and Kick questionnaire    Fear of Current or Ex-Partner: No    Emotionally Abused: No    Physically Abused: No    Sexually Abused: No    Outpatient Medications Prior to Visit  Medication Sig Dispense Refill   acetaminophen (TYLENOL) 500 MG tablet Take 500 mg by mouth every 6 (six) hours as needed.     budesonide-formoterol (SYMBICORT) 160-4.5 MCG/ACT inhaler  Inhale 2 puffs into the lungs 2 (two) times daily.     citalopram (CELEXA) 20 MG tablet Take 1 tablet (20 mg total) by mouth daily. 90 tablet 2   fluticasone (FLONASE) 50 MCG/ACT nasal spray Place 2 sprays into both nostrils daily. 16 g 6   Fluticasone-Umeclidin-Vilant (TRELEGY ELLIPTA) 100-62.5-25 MCG/ACT AEPB Inhale 1 puff into the lungs daily. 1 each 0   Multiple Vitamins-Minerals (CENTRUM SILVER PO) Take by mouth.     omeprazole (PRILOSEC) 40 MG capsule Take 1 capsule by mouth once daily 30 capsule 0   sucralfate (CARAFATE) 1 GM/10ML suspension Take 10 mLs (1 g total) by mouth 3 (three) times daily before meals. 900 mL 1   No facility-administered medications prior to visit.    Allergies  Allergen Reactions   Penicillins Rash    ROS Review of Systems  Constitutional:  Negative for chills and fever.  Eyes:  Negative for visual disturbance.  Respiratory:  Negative for chest tightness and shortness of breath.   Skin:  Positive for rash.  Neurological:  Negative for dizziness and headaches.      Objective:    Physical Exam HENT:     Head: Normocephalic.     Mouth/Throat:     Mouth: Mucous membranes are moist.  Cardiovascular:     Rate and Rhythm: Normal rate.     Heart sounds: Normal heart sounds.  Pulmonary:     Effort: Pulmonary effort is normal.     Breath sounds: Normal breath sounds.  Skin:    Findings: Rash (right arm) present.  Neurological:     Mental Status: She is alert.     BP 103/73 (BP Location: Right Arm, Patient Position: Sitting, Cuff Size: Normal)   Pulse 91   Ht 5\' 1"  (1.549 m)   Wt 95 lb 0.6 oz (43.1 kg)   SpO2 90%   BMI 17.96 kg/m  Wt Readings from Last 3 Encounters:  09/22/22 95 lb 0.6 oz (43.1 kg)  09/13/22 95 lb 7.4 oz (43.3 kg)  08/08/22 95 lb 6.4 oz (43.3 kg)    Lab Results  Component Value Date   TSH 1.176 03/13/2022   Lab Results  Component Value Date   WBC 4.8 03/13/2022   HGB 11.2 (L) 03/13/2022   HCT 34.8 (L) 03/13/2022    MCV 94.8 03/13/2022   PLT 244 03/13/2022   Lab Results  Component Value Date   NA 134 (L) 03/13/2022   K 3.9 03/13/2022   CO2 24 03/13/2022   GLUCOSE 108 (H) 03/13/2022   BUN 24 (H) 03/13/2022   CREATININE 1.39 (H) 03/13/2022   BILITOT 0.2 (  L) 03/13/2022   ALKPHOS 90 03/13/2022   AST 27 03/13/2022   ALT 17 03/13/2022   PROT 7.8 03/13/2022   ALBUMIN 3.3 (L) 03/13/2022   CALCIUM 8.7 (L) 03/13/2022   ANIONGAP 8 03/13/2022   EGFR 41 (L) 05/24/2021   Lab Results  Component Value Date   CHOL 154 12/22/2020   Lab Results  Component Value Date   HDL 74 12/22/2020   Lab Results  Component Value Date   LDLCALC 67 12/22/2020   Lab Results  Component Value Date   TRIG 64 12/22/2020   Lab Results  Component Value Date   CHOLHDL 2.4 11/11/2019   Lab Results  Component Value Date   HGBA1C 5.2 11/11/2019      Assessment & Plan:  Rash Assessment & Plan: The patient reports no systemic symptoms, such as itching, burning, tingling, or numbness. The onset of the rash occurred 2 weeks ago, and it has been intermittent. Non-pharmacological interventions are recommended, including the use of cold compresses, moisturizers, oatmeal baths, and avoidance of irritants. Additionally, gentle cleansing, wearing loose clothing, and staying well-hydrated are advised to manage the symptoms   IFG (impaired fasting glucose) -     Hemoglobin A1c  Vitamin D deficiency -     VITAMIN D 25 Hydroxy (Vit-D Deficiency, Fractures)  Other specified hypothyroidism -     TSH + free T4  Other hyperlipidemia -     Lipid panel -     CMP14+EGFR -     CBC with Differential/Platelet  Note: This chart has been completed using Engineer, civil (consulting) software, and while attempts have been made to ensure accuracy, certain words and phrases may not be transcribed as intended.    Follow-up: Return in about 4 months (around 01/22/2023).   Gilmore Laroche, FNP

## 2022-09-22 NOTE — Assessment & Plan Note (Signed)
The patient reports no systemic symptoms, such as itching, burning, tingling, or numbness. The onset of the rash occurred 2 weeks ago, and it has been intermittent. Non-pharmacological interventions are recommended, including the use of cold compresses, moisturizers, oatmeal baths, and avoidance of irritants. Additionally, gentle cleansing, wearing loose clothing, and staying well-hydrated are advised to manage the symptoms

## 2022-09-22 NOTE — Patient Instructions (Addendum)
I appreciate the opportunity to provide care to you today!    Follow up:  4 months  Labs: please stop by the lab today to get your blood drawn (CBC, CMP, TSH, Lipid profile, HgA1c, Vit D)   Please stop by your local pharmacy and get your Tdap and Shingles vaccine  Nonpharmacological interventions can be effective for managing rashes on the arm, Here are some approaches you might consider:  -Cool Compresses: Applying a cool, damp cloth to the rash can help soothe itching and reduce inflammation.  -Moisturizers: Use fragrance-free moisturizers to keep the skin hydrated, which can help alleviate dryness and itching. Look for products containing ceramides or hyaluronic acid.  -Oatmeal Baths: Soaking in an oatmeal bath or using colloidal oatmeal-based products can help soothe irritated skin.  -Avoid Irritants: Identify and avoid potential irritants or allergens that may be causing the rash. This includes harsh soaps, certain fabrics, or specific foods.  -Gentle Cleansing: Use mild, non-irritating cleansers when washing the affected area. Avoid hot water, which can worsen irritation.  -Loose Clothing: Wear loose, breathable clothing to prevent friction and allow the skin to heal.  -Avoid Scratching: Scratching can worsen the rash and increase the risk of infection. Keeping nails short and wearing gloves at night can help reduce scratching.  -Proper Hygiene: Maintain good hygiene to prevent infections, but be careful not to over-wash the area, which can strip the skin of its natural oils.  -Hydration: Drink plenty of water to keep your skin hydrated from the inside out.  -Natural Remedies: Some people find relief using natural remedies such as aloe vera gel or coconut oil. However, make sure to patch-test these products first to ensure they don't cause additional irritation.    Please continue to a heart-healthy diet and increase your physical activities. Try to exercise for at least  five days a week.    It was a pleasure to see you and I look forward to continuing to work together on your health and well-being. Please do not hesitate to call the office if you need care or have questions about your care.  In case of emergency, please visit the Emergency Department for urgent care, or contact our clinic at [phone] to schedule an appointment. We're here to help you!   Have a wonderful day and week. With Gratitude, Gilmore Laroche MSN, FNP-BC

## 2022-09-23 LAB — CMP14+EGFR
ALT: 25 IU/L (ref 0–32)
AST: 29 IU/L (ref 0–40)
Albumin: 4.1 g/dL (ref 3.9–4.9)
Alkaline Phosphatase: 143 IU/L — ABNORMAL HIGH (ref 44–121)
BUN/Creatinine Ratio: 19 (ref 12–28)
BUN: 31 mg/dL — ABNORMAL HIGH (ref 8–27)
Bilirubin Total: 0.3 mg/dL (ref 0.0–1.2)
CO2: 28 mmol/L (ref 20–29)
Calcium: 9.6 mg/dL (ref 8.7–10.3)
Chloride: 100 mmol/L (ref 96–106)
Creatinine, Ser: 1.62 mg/dL — ABNORMAL HIGH (ref 0.57–1.00)
Globulin, Total: 4 g/dL (ref 1.5–4.5)
Glucose: 75 mg/dL (ref 70–99)
Potassium: 4.9 mmol/L (ref 3.5–5.2)
Sodium: 138 mmol/L (ref 134–144)
Total Protein: 8.1 g/dL (ref 6.0–8.5)
eGFR: 35 mL/min/{1.73_m2} — ABNORMAL LOW (ref >59)

## 2022-09-23 LAB — LIPID PANEL
Chol/HDL Ratio: 2 ratio (ref 0.0–4.4)
Cholesterol, Total: 162 mg/dL (ref 100–199)
HDL: 83 mg/dL (ref >39)
LDL Chol Calc (NIH): 64 mg/dL (ref 0–99)
Triglycerides: 77 mg/dL (ref 0–149)
VLDL Cholesterol Cal: 15 mg/dL (ref 5–40)

## 2022-09-23 LAB — CBC WITH DIFFERENTIAL/PLATELET
Basophils Absolute: 0 10*3/uL (ref 0.0–0.2)
Basos: 1 %
EOS (ABSOLUTE): 0.1 10*3/uL (ref 0.0–0.4)
Eos: 3 %
Hematocrit: 35.8 % (ref 34.0–46.6)
Hemoglobin: 11.8 g/dL (ref 11.1–15.9)
Immature Grans (Abs): 0 10*3/uL (ref 0.0–0.1)
Immature Granulocytes: 0 %
Lymphocytes Absolute: 1.7 10*3/uL (ref 0.7–3.1)
Lymphs: 44 %
MCH: 30.3 pg (ref 26.6–33.0)
MCHC: 33 g/dL (ref 31.5–35.7)
MCV: 92 fL (ref 79–97)
Monocytes Absolute: 0.4 10*3/uL (ref 0.1–0.9)
Monocytes: 10 %
Neutrophils Absolute: 1.6 10*3/uL (ref 1.4–7.0)
Neutrophils: 42 %
Platelets: 312 10*3/uL (ref 150–450)
RBC: 3.89 x10E6/uL (ref 3.77–5.28)
RDW: 11.9 % (ref 11.7–15.4)
WBC: 3.9 10*3/uL (ref 3.4–10.8)

## 2022-09-23 LAB — HEMOGLOBIN A1C
Est. average glucose Bld gHb Est-mCnc: 114 mg/dL
Hgb A1c MFr Bld: 5.6 % (ref 4.8–5.6)

## 2022-09-23 LAB — TSH+FREE T4
Free T4: 1.08 ng/dL (ref 0.82–1.77)
TSH: 2.12 u[IU]/mL (ref 0.450–4.500)

## 2022-09-23 LAB — VITAMIN D 25 HYDROXY (VIT D DEFICIENCY, FRACTURES): Vit D, 25-Hydroxy: 31.2 ng/mL (ref 30.0–100.0)

## 2022-09-23 NOTE — Progress Notes (Signed)
Please encourage the patient to increase her fluid intake to at least 64 ounces daily, as her labs indicate inadequate fluid consumption.

## 2022-10-08 ENCOUNTER — Other Ambulatory Visit: Payer: Self-pay | Admitting: Family Medicine

## 2022-10-08 DIAGNOSIS — K219 Gastro-esophageal reflux disease without esophagitis: Secondary | ICD-10-CM

## 2022-11-05 ENCOUNTER — Other Ambulatory Visit: Payer: Self-pay | Admitting: Family Medicine

## 2022-11-05 DIAGNOSIS — K219 Gastro-esophageal reflux disease without esophagitis: Secondary | ICD-10-CM

## 2022-11-20 ENCOUNTER — Other Ambulatory Visit: Payer: Self-pay | Admitting: Gastroenterology

## 2022-11-29 ENCOUNTER — Other Ambulatory Visit: Payer: Self-pay

## 2022-11-29 ENCOUNTER — Telehealth: Payer: Self-pay | Admitting: Family Medicine

## 2022-11-29 NOTE — Telephone Encounter (Signed)
Daughter called in on patient behalf in regard to budesonide-formoterol [facility]) 160-4.5 MCG/ACT inhaler [[id number]]    The assistance program with Pharm D is no longer carrying inhaler,   Patient is needing to find another way to get inhaler. Wants a call; back in regard

## 2022-12-06 NOTE — Progress Notes (Unsigned)
   Established Patient Office Visit   Subjective  Patient ID: Grace Cross, female    DOB: 1955/05/29  Age: 67 y.o. MRN: [id number]  No chief complaint on file.   She  has a past medical history of Acid reflux, Asthma, Asthma, COPD (chronic obstructive pulmonary disease) (HCC), Depression, Depression, Hypertension, Hypothyroidism, and Oxygen dependent.  HPI  ROS    Objective:     There were no vitals taken for this visit. {Vitals History (Optional):23777}  Physical Exam   No results found for any visits on 12/07/22.  The 10-year ASCVD risk score (Arnett DK, et al., 2019) is: 4.6%    Assessment & Plan:  There are no diagnoses linked to this encounter.  No follow-ups on file.   Cruzita Lederer Newman Nip, FNP

## 2022-12-06 NOTE — Patient Instructions (Signed)

## 2022-12-07 ENCOUNTER — Encounter: Payer: Self-pay | Admitting: Family Medicine

## 2022-12-07 ENCOUNTER — Ambulatory Visit: Payer: Medicare Other | Admitting: Family Medicine

## 2022-12-07 VITALS — BP 95/69 | HR 88 | Ht 61.0 in | Wt 95.0 lb

## 2022-12-07 DIAGNOSIS — R5383 Other fatigue: Secondary | ICD-10-CM

## 2022-12-07 DIAGNOSIS — J069 Acute upper respiratory infection, unspecified: Secondary | ICD-10-CM

## 2022-12-07 MED ORDER — PREDNISONE 20 MG PO TABS: 20.0000 mg | ORAL_TABLET | Freq: Two times a day (BID) | ORAL | 0 refills | Status: AC

## 2022-12-07 MED ORDER — BENZONATATE 200 MG PO CAPS
200.0000 mg | ORAL_CAPSULE | Freq: Two times a day (BID) | ORAL | 0 refills | Status: DC | PRN
Start: 2022-12-07 — End: 2023-01-18

## 2022-12-07 MED ORDER — AZITHROMYCIN 250 MG PO TABS: ORAL_TABLET | ORAL | 0 refills | Status: DC

## 2022-12-07 MED ORDER — LIDOCAINE VISCOUS HCL 2 % MT SOLN: 15.0000 mL | OROMUCOSAL | 0 refills | Status: DC | PRN

## 2022-12-07 MED ORDER — BREO ELLIPTA 50-25 MCG/INH IN AEPB: 1.0000 | INHALATION_SPRAY | Freq: Every day | RESPIRATORY_TRACT | 2 refills | Status: DC

## 2022-12-07 NOTE — Assessment & Plan Note (Signed)
Azithromycin 250 mg twice daily x 5 days,  Prednisone 20 mg twice day x 5 days , Benzonatate 200 mg PRN,  Lidocaine viscous solution PRN Advise patient to rest to support your body's recovery. Stay hydrated by drinking water, tea, or broth. Using a humidifier can help soothe throat irritation and ease nasal congestion. For fever or pain, acetaminophen (Tylenol) is recommended. To relieve other symptoms, try saline nasal sprays, throat lozenges, or gargling with saltwater. Focus on eating light, healthy meals like fruits and vegetables to keep your strength up. Practice good hygiene by washing your hands frequently and covering your mouth when coughing or sneezing.Follow-up for worsening or persistent symptoms. Patient verbalizes understanding regarding plan of care and all questions answered

## 2022-12-15 ENCOUNTER — Other Ambulatory Visit: Payer: Self-pay | Admitting: Family Medicine

## 2022-12-15 DIAGNOSIS — K219 Gastro-esophageal reflux disease without esophagitis: Secondary | ICD-10-CM

## 2022-12-18 ENCOUNTER — Other Ambulatory Visit: Payer: Self-pay | Admitting: Family Medicine

## 2022-12-18 ENCOUNTER — Telehealth: Payer: Self-pay

## 2022-12-18 DIAGNOSIS — J069 Acute upper respiratory infection, unspecified: Secondary | ICD-10-CM

## 2022-12-18 MED ORDER — PROMETHAZINE-DM 6.25-15 MG/5ML PO SYRP: 5.0000 mL | ORAL_SOLUTION | Freq: Four times a day (QID) | ORAL | 0 refills | Status: DC | PRN

## 2022-12-18 NOTE — Telephone Encounter (Signed)
Wanting something called in?

## 2022-12-18 NOTE — Telephone Encounter (Signed)
Rx sent 

## 2022-12-31 ENCOUNTER — Other Ambulatory Visit: Payer: Self-pay | Admitting: Gastroenterology

## 2023-01-03 NOTE — Telephone Encounter (Signed)
Patient never completed recommended procedures. Please offer return ov.

## 2023-01-08 ENCOUNTER — Encounter: Payer: Self-pay | Admitting: Gastroenterology

## 2023-01-08 NOTE — Telephone Encounter (Signed)
Called pt no answer and no voice mail set up - will mail letter to inform patient to make an appointment

## 2023-01-12 ENCOUNTER — Other Ambulatory Visit: Payer: Self-pay | Admitting: Family Medicine

## 2023-01-12 DIAGNOSIS — K219 Gastro-esophageal reflux disease without esophagitis: Secondary | ICD-10-CM

## 2023-01-18 ENCOUNTER — Encounter: Payer: Self-pay | Admitting: Family Medicine

## 2023-01-18 ENCOUNTER — Ambulatory Visit (INDEPENDENT_AMBULATORY_CARE_PROVIDER_SITE_OTHER): Payer: Medicare Other | Admitting: Family Medicine

## 2023-01-18 VITALS — BP 92/68 | HR 91 | Ht 61.0 in | Wt 95.1 lb

## 2023-01-18 DIAGNOSIS — R1013 Epigastric pain: Secondary | ICD-10-CM | POA: Diagnosis not present

## 2023-01-18 DIAGNOSIS — R7301 Impaired fasting glucose: Secondary | ICD-10-CM

## 2023-01-18 DIAGNOSIS — Z1231 Encounter for screening mammogram for malignant neoplasm of breast: Secondary | ICD-10-CM

## 2023-01-18 DIAGNOSIS — Z1211 Encounter for screening for malignant neoplasm of colon: Secondary | ICD-10-CM

## 2023-01-18 DIAGNOSIS — E7849 Other hyperlipidemia: Secondary | ICD-10-CM

## 2023-01-18 DIAGNOSIS — J4520 Mild intermittent asthma, uncomplicated: Secondary | ICD-10-CM | POA: Diagnosis not present

## 2023-01-18 DIAGNOSIS — R058 Other specified cough: Secondary | ICD-10-CM

## 2023-01-18 DIAGNOSIS — E559 Vitamin D deficiency, unspecified: Secondary | ICD-10-CM

## 2023-01-18 DIAGNOSIS — J069 Acute upper respiratory infection, unspecified: Secondary | ICD-10-CM

## 2023-01-18 DIAGNOSIS — E038 Other specified hypothyroidism: Secondary | ICD-10-CM

## 2023-01-18 DIAGNOSIS — F339 Major depressive disorder, recurrent, unspecified: Secondary | ICD-10-CM | POA: Diagnosis not present

## 2023-01-18 DIAGNOSIS — Z13820 Encounter for screening for osteoporosis: Secondary | ICD-10-CM

## 2023-01-18 DIAGNOSIS — K219 Gastro-esophageal reflux disease without esophagitis: Secondary | ICD-10-CM

## 2023-01-18 DIAGNOSIS — R0981 Nasal congestion: Secondary | ICD-10-CM

## 2023-01-18 MED ORDER — OMEPRAZOLE 40 MG PO CPDR: 40.0000 mg | DELAYED_RELEASE_CAPSULE | Freq: Every day | ORAL | 0 refills | Status: DC

## 2023-01-18 MED ORDER — ALBUTEROL SULFATE HFA 108 (90 BASE) MCG/ACT IN AERS: 2.0000 | INHALATION_SPRAY | Freq: Four times a day (QID) | RESPIRATORY_TRACT | 2 refills | Status: DC | PRN

## 2023-01-18 MED ORDER — BREO ELLIPTA 50-25 MCG/INH IN AEPB: 1.0000 | INHALATION_SPRAY | Freq: Every day | RESPIRATORY_TRACT | 2 refills | Status: DC

## 2023-01-18 MED ORDER — FLUTICASONE PROPIONATE 50 MCG/ACT NA SUSP: 2.0000 | Freq: Every day | NASAL | 6 refills | Status: DC

## 2023-01-18 MED ORDER — BENZONATATE 200 MG PO CAPS: 200.0000 mg | ORAL_CAPSULE | Freq: Two times a day (BID) | ORAL | 0 refills | Status: DC | PRN

## 2023-01-18 MED ORDER — CITALOPRAM HYDROBROMIDE 20 MG PO TABS: 20.0000 mg | ORAL_TABLET | Freq: Every day | ORAL | 2 refills | Status: DC

## 2023-01-18 MED ORDER — PROMETHAZINE-DM 6.25-15 MG/5ML PO SYRP: 5.0000 mL | ORAL_SOLUTION | Freq: Four times a day (QID) | ORAL | 0 refills | Status: DC | PRN

## 2023-01-18 NOTE — Progress Notes (Signed)
Established Patient Office Visit  Subjective:  Patient ID: Grace Cross, female    DOB: 1955/10/31  Age: 67 y.o. MRN: [id number]  CC:  Chief Complaint  Patient presents with   Care Management    4 month f/u would like refills to keep on hand for her cough.    HPI Grace Cross is a 67 y.o. female with past medical history of GERD, depression, epigastric abdominal pain presents for f/u of  chronic medical conditions. For the details of today's visit, please refer to the assessment and plan.     Past Medical History:  Diagnosis Date   Acid reflux    Asthma    Asthma    Phreesia 05/21/2020   COPD (chronic obstructive pulmonary disease) (HCC)    Depression    Depression    [employer]reesia 05/21/2020   Hypertension    Hypothyroidism    used to take thyroid medication   Oxygen dependent    1-2l/m [place] at night and PRN    Past Surgical History:  Procedure Laterality Date   COLONOSCOPY, ESOPHAGOGASTRODUODENOSCOPY (EGD) AND ESOPHAGEAL DILATION N/A 06/17/2012   WGN:FAOZ IN CECUM/Mod IH/ESO ring/Gastritis    Family History  Problem Relation Age of Onset   Asthma Mother    Heart attack Father    Congestive Heart Failure Father    Diabetes Sister    Hyperlipidemia Brother    Hypertension Brother    Asthma Brother    Hypertension Brother    Colon cancer Neg Hx    Inflammatory bowel disease Neg Hx     Social History   Socioeconomic History   Marital status: Single    Spouse name: Not on file   Number of children: 1   Years of education: Not on file   Highest education level: Not on file  Occupational History   Occupation: unemployed  Tobacco Use   Smoking status: Former    Current packs/day: 0.00    Average packs/day: 0.5 packs/day for 22.0 years (11.0 ttl pk-yrs)    Types: Cigarettes    Start date: 01/30/1973    Quit date: 01/31/1995    Years since quitting: 27.9   Smokeless tobacco: Never  Vaping Use   Vaping status: Never Used  Substance and Sexual Activity    Alcohol use: Not Currently    Comment: pt denies   Drug use: No   Sexual activity: Not Currently    Birth control/protection: Post-menopausal  Other Topics Concern   Not on file  Social History Narrative   Daughter present with her today   Social Drivers of Health   Financial Resource Strain: Medium Risk (04/28/2022)   Overall Financial Resource Strain (CARDIA)    Difficulty of Paying Living Expenses: Somewhat hard  Food Insecurity: Food Insecurity Present (04/28/2022)   Hunger Vital Sign    Worried About Running Out of Food in the Last Year: Often true    Ran Out of Food in the Last Year: Often true  Transportation Needs: No Transportation Needs (04/28/2022)   PRAPARE - Administrator, Civil Service (Medical): No    Lack of Transportation (Non-Medical): No  Physical Activity: Inactive (04/28/2022)   Exercise Vital Sign    Days of Exercise per Week: 0 days    Minutes of Exercise per Session: 0 min  Stress: Stress Concern Present (04/28/2022)   [employer] of Occupational Health - Occupational Stress Questionnaire    Feeling of Stress : To some extent  Social Connections: Unknown (  04/28/2022)   Social Connection and Isolation Panel [NHANES]    Frequency of Communication with Friends and Family: Three times a week    Frequency of Social Gatherings with Friends and Family: Once a week    Attends Religious Services: Never    Database administrator or Organizations: No    Attends Banker Meetings: Never    Marital Status: Patient declined  Catering manager Violence: Not At Risk (04/28/2022)   Humiliation, Afraid, Rape, and Kick questionnaire    Fear of Current or Ex-Partner: No    Emotionally Abused: No    Physically Abused: No    Sexually Abused: No    Outpatient Medications Prior to Visit  Medication Sig Dispense Refill   acetaminophen (TYLENOL) 500 MG tablet Take 500 mg by mouth every 6 (six) hours as needed.     lidocaine (XYLOCAINE) 2 % solution  Use as directed 15 mLs in the mouth or throat as needed. 100 mL 0   Multiple Vitamins-Minerals (CENTRUM SILVER PO) Take by mouth.     sucralfate (CARAFATE) 1 GM/10ML suspension TAKE 10 MLS (1 G TOTAL) BY MOUTH THREE TIMES DAILY BEFORE MEALS 900 mL 0   azithromycin (ZITHROMAX) 250 MG tablet Take 2 tablets on day 1, then 1 tablet daily on days 2 through 5 6 tablet 0   benzonatate (TESSALON) 200 MG capsule Take 1 capsule (200 mg total) by mouth 2 (two) times daily as needed for cough. 20 capsule 0   citalopram (CELEXA) 20 MG tablet Take 1 tablet (20 mg total) by mouth daily. 90 tablet 2   fluticasone (FLONASE) 50 MCG/ACT nasal spray Place 2 sprays into both nostrils daily. 16 g 6   Fluticasone Furoate-Vilanterol (BREO ELLIPTA) 50-25 MCG/ACT AEPB Inhale 1 puff into the lungs daily in the afternoon. 60 each 2   omeprazole (PRILOSEC) 40 MG capsule Take 1 capsule by mouth once daily 30 capsule 0   promethazine-dextromethorphan (PROMETHAZINE-DM) 6.25-15 MG/5ML syrup Take 5 mLs by mouth 4 (four) times daily as needed. 118 mL 0   No facility-administered medications prior to visit.    Allergies  Allergen Reactions   Penicillins Rash    ROS Review of Systems  Constitutional:  Negative for chills and fever.  Eyes:  Negative for visual disturbance.  Respiratory:  Negative for chest tightness and shortness of breath.   Neurological:  Negative for dizziness and headaches.      Objective:    Physical Exam HENT:     Head: Normocephalic.     Mouth/Throat:     Mouth: Mucous membranes are moist.  Cardiovascular:     Rate and Rhythm: Normal rate.     Heart sounds: Normal heart sounds.  Pulmonary:     Effort: Pulmonary effort is normal.     Breath sounds: Normal breath sounds.  Neurological:     Mental Status: She is alert.     BP 92/68 (BP Location: Left Arm)   Pulse 91   Ht 5\' 1"  (1.549 m)   Wt 95 lb 1.9 oz (43.1 kg)   SpO2 99%   BMI 17.97 kg/m  Wt Readings from Last 3 Encounters:   01/18/23 95 lb 1.9 oz (43.1 kg)  12/07/22 95 lb 0.6 oz (43.1 kg)  09/22/22 95 lb 0.6 oz (43.1 kg)    Lab Results  Component Value Date   TSH 1.350 01/18/2023   Lab Results  Component Value Date   WBC 3.9 01/18/2023   HGB 11.6 01/18/2023  HCT 36.2 01/18/2023   MCV 95 01/18/2023   PLT 349 01/18/2023   Lab Results  Component Value Date   NA 141 01/18/2023   K 4.6 01/18/2023   CO2 25 01/18/2023   GLUCOSE 84 01/18/2023   BUN 20 01/18/2023   CREATININE 1.44 (H) 01/18/2023   BILITOT 0.4 01/18/2023   ALKPHOS 135 (H) 01/18/2023   AST 28 01/18/2023   ALT 22 01/18/2023   PROT 7.7 01/18/2023   ALBUMIN 3.9 01/18/2023   CALCIUM 9.7 01/18/2023   ANIONGAP 8 03/13/2022   EGFR 40 (L) 01/18/2023   Lab Results  Component Value Date   CHOL 174 01/18/2023   Lab Results  Component Value Date   HDL 72 01/18/2023   Lab Results  Component Value Date   LDLCALC 86 01/18/2023   Lab Results  Component Value Date   TRIG 91 01/18/2023   Lab Results  Component Value Date   CHOLHDL 2.4 01/18/2023   Lab Results  Component Value Date   HGBA1C 5.7 (H) 01/18/2023      Assessment & Plan:  Depression, recurrent (HCC) Assessment & Plan: The patient remains stable on Celexa 20 mg daily and denies any suicidal thoughts or ideation. Nonpharmacological interventions, including mindfulness, deep breathing exercises, and meditation, were discussed with the patient. A refill has been sent to the pharmacy. Flowsheet Row Office Visit from [facility]/18/2023 in [facility]  PHQ-9 Total Score 0         Orders: -     Citalopram Hydrobromide; Take 1 tablet (20 mg total) by mouth daily.  Dispense: 90 tablet; Refill: 2  Abdominal pain, epigastric Assessment & Plan: Stable on Carafate 10 mg TID    Other cough -     Promethazine-DM; Take 5 mLs by mouth 4 (four) times daily as needed.  Dispense: 118 mL; Refill: 0  Mild intermittent asthma, unspecified whether  complicated Assessment & Plan: Stable on her maintenance inhaler and requesting a refill today Refill sent to pharmacy  Orders: -     Breo Ellipta; Inhale 1 puff into the lungs daily in the afternoon.  Dispense: 60 each; Refill: 2 -     Albuterol Sulfate HFA; Inhale 2 puffs into the lungs every 6 (six) hours as needed for wheezing or shortness of breath.  Dispense: 8 g; Refill: 2  Gastroesophageal reflux disease, unspecified whether esophagitis present Assessment & Plan: She takes omeprazole 40 mg daily She reports relief of her symptoms with current treatment regimen  GERD diet encouraged Encouraged to continue current treatment regimen  Orders: -     Omeprazole; Take 1 capsule (40 mg total) by mouth daily.  Dispense: 30 capsule; Refill: 0  Nasal sinus congestion -     Fluticasone Propionate; Place 2 sprays into both nostrils daily.  Dispense: 16 g; Refill: 6  Colon cancer screening -     Cologuard  Breast cancer screening by mammogram -     3D Screening Mammogram, Left and Right  IFG (impaired fasting glucose) -     Hemoglobin A1c  Vitamin D deficiency -     VITAMIN D 25 Hydroxy (Vit-D Deficiency, Fractures)  TSH (thyroid-stimulating hormone deficiency) -     TSH + free T4  Other hyperlipidemia -     Lipid panel -     CMP14+EGFR -     CBC with Differential/Platelet  Note: This chart has been completed using [employer] software, and while attempts have been made to ensure accuracy,  certain words and phrases may not be transcribed as intended.    Follow-up: Return in about 4 months (around 05/19/2023).   Gilmore Laroche, FNP

## 2023-01-18 NOTE — Patient Instructions (Addendum)
  I appreciate the opportunity to provide care to you today!    Follow up:  4 months  Labs: please stop by the lab today to get your blood drawn (CBC, CMP, TSH, Lipid profile, HgA1c, Vit D)   PLEASE SCHEDULE MAMMOGRAM    Attached with your AVS, you will find valuable resources for self-education. I highly recommend dedicating some time to thoroughly examine them.   Please continue to a heart-healthy diet and increase your physical activities. Try to exercise for at least five days a week.    It was a pleasure to see you and I look forward to continuing to work together on your health and well-being. Please do not hesitate to call the office if you need care or have questions about your care.  In case of emergency, please visit the Emergency Department for urgent care, or contact our clinic at [phone] to schedule an appointment. We're here to help you!   Have a wonderful day and week. With Gratitude, Gilmore Laroche MSN, FNP-BC

## 2023-01-19 DIAGNOSIS — R059 Cough, unspecified: Secondary | ICD-10-CM | POA: Insufficient documentation

## 2023-01-19 LAB — CBC WITH DIFFERENTIAL/PLATELET
Basophils Absolute: 0 10*3/uL (ref 0.0–0.2)
Basos: 1 %
EOS (ABSOLUTE): 0.1 10*3/uL (ref 0.0–0.4)
Eos: 2 %
Hematocrit: 36.2 % (ref 34.0–46.6)
Hemoglobin: 11.6 g/dL (ref 11.1–15.9)
Immature Grans (Abs): 0 10*3/uL (ref 0.0–0.1)
Immature Granulocytes: 0 %
Lymphocytes Absolute: 1.9 10*3/uL (ref 0.7–3.1)
Lymphs: 49 %
MCH: 30.4 pg (ref 26.6–33.0)
MCHC: 32 g/dL (ref 31.5–35.7)
MCV: 95 fL (ref 79–97)
Monocytes Absolute: 0.6 10*3/uL (ref 0.1–0.9)
Monocytes: 15 %
Neutrophils Absolute: 1.3 10*3/uL — ABNORMAL LOW (ref 1.4–7.0)
Neutrophils: 33 %
Platelets: 349 10*3/uL (ref 150–450)
RBC: 3.81 x10E6/uL (ref 3.77–5.28)
RDW: 13.1 % (ref 11.7–15.4)
WBC: 3.9 10*3/uL (ref 3.4–10.8)

## 2023-01-19 LAB — CMP14+EGFR
ALT: 22 [IU]/L (ref 0–32)
AST: 28 [IU]/L (ref 0–40)
Albumin: 3.9 g/dL (ref 3.9–4.9)
Alkaline Phosphatase: 135 [IU]/L — ABNORMAL HIGH (ref 44–121)
BUN/Creatinine Ratio: 14 (ref 12–28)
BUN: 20 mg/dL (ref 8–27)
Bilirubin Total: 0.4 mg/dL (ref 0.0–1.2)
CO2: 25 mmol/L (ref 20–29)
Calcium: 9.7 mg/dL (ref 8.7–10.3)
Chloride: 103 mmol/L (ref 96–106)
Creatinine, Ser: 1.44 mg/dL — ABNORMAL HIGH (ref 0.57–1.00)
Globulin, Total: 3.8 g/dL (ref 1.5–4.5)
Glucose: 84 mg/dL (ref 70–99)
Potassium: 4.6 mmol/L (ref 3.5–5.2)
Sodium: 141 mmol/L (ref 134–144)
Total Protein: 7.7 g/dL (ref 6.0–8.5)
eGFR: 40 mL/min/{1.73_m2} — ABNORMAL LOW (ref >59)

## 2023-01-19 LAB — TSH+FREE T4
Free T4: 1.03 ng/dL (ref 0.82–1.77)
TSH: 1.35 u[IU]/mL (ref 0.450–4.500)

## 2023-01-19 LAB — VITAMIN D 25 HYDROXY (VIT D DEFICIENCY, FRACTURES): Vit D, 25-Hydroxy: 30.5 ng/mL (ref 30.0–100.0)

## 2023-01-19 LAB — LIPID PANEL
Chol/HDL Ratio: 2.4 {ratio} (ref 0.0–4.4)
Cholesterol, Total: 174 mg/dL (ref 100–199)
HDL: 72 mg/dL (ref >39)
LDL Chol Calc (NIH): 86 mg/dL (ref 0–99)
Triglycerides: 91 mg/dL (ref 0–149)
VLDL Cholesterol Cal: 16 mg/dL (ref 5–40)

## 2023-01-19 LAB — HEMOGLOBIN A1C
Est. average glucose Bld gHb Est-mCnc: 117 mg/dL
Hgb A1c MFr Bld: 5.7 % — ABNORMAL HIGH (ref 4.8–5.6)

## 2023-01-19 NOTE — Assessment & Plan Note (Signed)
Stable on Carafate 10 mg TID

## 2023-01-19 NOTE — Assessment & Plan Note (Signed)
Stable on her maintenance inhaler and requesting a refill today Refill sent to pharmacy

## 2023-01-19 NOTE — Assessment & Plan Note (Signed)
The patient remains stable on Celexa 20 mg daily and denies any suicidal thoughts or ideation. Nonpharmacological interventions, including mindfulness, deep breathing exercises, and meditation, were discussed with the patient. A refill has been sent to the pharmacy. Flowsheet Row Office Visit from 01/18/2023 in [facility]  PHQ-9 Total Score 0

## 2023-01-19 NOTE — Assessment & Plan Note (Signed)
She takes omeprazole 40 mg daily She reports relief of her symptoms with current treatment regimen  GERD diet encouraged Encouraged to continue current treatment regimen

## 2023-01-19 NOTE — Assessment & Plan Note (Addendum)
Refill sent to take as need or cough Denies nasal congestion, fever, chills, headaches, facial pain pressure, sore throat, nausea vomiting diarrhea

## 2023-01-29 NOTE — Progress Notes (Signed)
Please inform the patient that her hemoglobin A1c is 5.7, indicating that she is prediabetic. I recommend lifestyle changes, including decreasing her intake of high-sugar foods and beverages, along with increasing physical activity.  Her labs also indicate inadequate hydration, and I recommend increasing her fluid intake to at least 64 ounces of water daily.  All other lab results are stable.

## 2023-02-07 ENCOUNTER — Inpatient Hospital Stay (HOSPITAL_COMMUNITY): Admission: RE | Admit: 2023-02-07 | Payer: Medicare Other | Source: Ambulatory Visit

## 2023-02-07 ENCOUNTER — Other Ambulatory Visit (HOSPITAL_COMMUNITY): Payer: Medicare Other

## 2023-02-22 ENCOUNTER — Other Ambulatory Visit: Payer: Self-pay | Admitting: Family Medicine

## 2023-02-22 ENCOUNTER — Other Ambulatory Visit: Payer: Self-pay | Admitting: Gastroenterology

## 2023-02-22 DIAGNOSIS — R058 Other specified cough: Secondary | ICD-10-CM

## 2023-02-26 ENCOUNTER — Other Ambulatory Visit: Payer: Self-pay | Admitting: Family Medicine

## 2023-02-26 DIAGNOSIS — K219 Gastro-esophageal reflux disease without esophagitis: Secondary | ICD-10-CM

## 2023-03-05 NOTE — Telephone Encounter (Signed)
Copied from CRM [id number]. Topic: General - Other >> Mar 05, 2023  1:55 PM Payton Doughty wrote: Reason for CRM: Daughter Jenetta Loges calling to ask that the dr write in a letter stating she is pt's caregiver, and takes her to her dr appts.  The dr has done one in the previous, but since this is a new year, Social Services is requesting an updated new letter for this year

## 2023-03-12 ENCOUNTER — Telehealth: Payer: Self-pay | Admitting: Family Medicine

## 2023-03-12 NOTE — Telephone Encounter (Signed)
 Copied from CRM [id number]. Topic: General - Call Back - No Documentation >> Mar 12, 2023  1:16 PM Higinio Roger wrote: Reason for CRM: Maryruth Hancock (daughter) needs a letter stating she is the caregiver for Patient Treloar M. Pippenger) to turn in to social services by 03/15/2023. She would like a callback at [phone].

## 2023-03-16 ENCOUNTER — Ambulatory Visit: Payer: Self-pay | Admitting: Family Medicine

## 2023-03-16 ENCOUNTER — Telehealth: Payer: Self-pay | Admitting: Family Medicine

## 2023-03-16 ENCOUNTER — Encounter: Payer: Self-pay | Admitting: Family Medicine

## 2023-03-16 NOTE — Telephone Encounter (Addendum)
1538 first attempt; no answer and vm full.  1557 second attempt; no answer and vm full.

## 2023-03-16 NOTE — Telephone Encounter (Signed)
Copied from CRM [id number]. Topic: General - Other >> Mar 15, 2023  5:01 PM Gery Pray wrote: Reason for CRM: Grace Cross (daughter) needs a letter stating she is the caregiver for Patient Grace Cross) to turn in to social services by 03/15/2023. She would like a callback at [phone]. Daughter has been attempting to get the letter for about 3-4 weeks and has not received a callback nor the letter.

## 2023-03-16 NOTE — Telephone Encounter (Signed)
Letter generated and printed. Will be in completed portfolio at front desk for pick up.

## 2023-03-26 ENCOUNTER — Other Ambulatory Visit: Payer: Self-pay | Admitting: Gastroenterology

## 2023-04-24 ENCOUNTER — Other Ambulatory Visit: Payer: Self-pay | Admitting: Family Medicine

## 2023-04-24 DIAGNOSIS — K219 Gastro-esophageal reflux disease without esophagitis: Secondary | ICD-10-CM

## 2023-04-30 ENCOUNTER — Other Ambulatory Visit: Payer: Self-pay | Admitting: Gastroenterology

## 2023-04-30 ENCOUNTER — Other Ambulatory Visit: Payer: Self-pay | Admitting: Family Medicine

## 2023-04-30 DIAGNOSIS — K219 Gastro-esophageal reflux disease without esophagitis: Secondary | ICD-10-CM

## 2023-05-02 ENCOUNTER — Other Ambulatory Visit: Payer: Self-pay

## 2023-05-02 ENCOUNTER — Ambulatory Visit: Payer: Medicare Other

## 2023-05-02 ENCOUNTER — Other Ambulatory Visit: Payer: Self-pay | Admitting: Family Medicine

## 2023-05-02 ENCOUNTER — Telehealth: Payer: Self-pay | Admitting: Family Medicine

## 2023-05-02 VITALS — BP 92/68 | Ht 61.0 in | Wt 95.0 lb

## 2023-05-02 DIAGNOSIS — Z0000 Encounter for general adult medical examination without abnormal findings: Secondary | ICD-10-CM | POA: Diagnosis not present

## 2023-05-02 DIAGNOSIS — R058 Other specified cough: Secondary | ICD-10-CM

## 2023-05-02 DIAGNOSIS — Z1231 Encounter for screening mammogram for malignant neoplasm of breast: Secondary | ICD-10-CM

## 2023-05-02 DIAGNOSIS — R0981 Nasal congestion: Secondary | ICD-10-CM

## 2023-05-02 DIAGNOSIS — E119 Type 2 diabetes mellitus without complications: Secondary | ICD-10-CM

## 2023-05-02 MED ORDER — FLUTICASONE PROPIONATE 50 MCG/ACT NA SUSP: 2.0000 | Freq: Every day | NASAL | 6 refills | Status: DC

## 2023-05-02 NOTE — Telephone Encounter (Signed)
 Patient's daughter called and left VM to return the call to the office regarding refills. Will need to know which medications to send in refills and which pharmacy.  Copied from CRM [id number]. Topic: Clinical - Medication Question >> May 02, 2023  9:16 AM Marland Kitchen D wrote: Patient's daughter called to check on her prescription refills she doesn't know the name of there medications but called about it last week the pharmacy has not gotten anything from the office please call back [phone]

## 2023-05-02 NOTE — Progress Notes (Signed)
 Because this visit was a virtual/telehealth visit,  certain criteria was not obtained, such a blood pressure, CBG if applicable, and timed get up and go. Any medications not marked as "taking" were not mentioned during the medication reconciliation part of the visit. Any vitals not documented were not able to be obtained due to this being a telehealth visit or patient was unable to self-report a recent blood pressure reading due to a lack of equipment at home via telehealth. Vitals that have been documented are verbally provided by the patient.   Subjective:   Grace Cross is a 68 y.o. who presents for a Medicare Wellness preventive visit.  Visit Complete: Virtual I connected with  Valarie Cones on 05/02/23 by a audio enabled telemedicine application and verified that I am speaking with the correct person using two identifiers.  Patient Location: Home  Provider Location: Home Office  I discussed the limitations of evaluation and management by telemedicine. The patient expressed understanding and agreed to proceed.  Vital Signs: Because this visit was a virtual/telehealth visit, some criteria may be missing or patient reported. Any vitals not documented were not able to be obtained and vitals that have been documented are patient reported.  VideoDeclined- This patient declined Librarian, academic. Therefore the visit was completed with audio only.  Persons Participating in Visit: Patient.  AWV Questionnaire: No: Patient Medicare AWV questionnaire was not completed prior to this visit.  Cardiac Risk Factors include: advanced age (>69men, >85 women);Other (see comment);hypertension, Risk factor comments: COPD     Objective:    Today's Vitals   05/02/23 1250  BP: 92/68  Weight: 95 lb (43.1 kg)  Height: 5\' 1"  (1.549 m)   Body mass index is 17.95 kg/m.     05/02/2023   12:49 PM 09/13/2022    2:12 PM 04/28/2022    1:44 PM 03/13/2022    6:52 PM 05/15/2021     4:28 AM 01/14/2021   10:43 AM 10/19/2017    1:04 AM  Advanced Directives  Does Patient Have a Medical Advance Directive? No No No No No No No  Would patient like information on creating a medical advance directive? No - Patient declined Yes (MAU/Ambulatory/Procedural Areas - Information given) No - Patient declined  No - Patient declined  No - Patient declined    Current Medications (verified) Outpatient Encounter Medications as of 05/02/2023  Medication Sig   acetaminophen (TYLENOL) 500 MG tablet Take 500 mg by mouth every 6 (six) hours as needed.   albuterol (VENTOLIN HFA) 108 (90 Base) MCG/ACT inhaler Inhale 2 puffs into the lungs every 6 (six) hours as needed for wheezing or shortness of breath.   citalopram (CELEXA) 20 MG tablet Take 1 tablet (20 mg total) by mouth daily.   fluticasone (FLONASE) 50 MCG/ACT nasal spray Place 2 sprays into both nostrils daily.   Fluticasone Furoate-Vilanterol (BREO ELLIPTA) 50-25 MCG/ACT AEPB Inhale 1 puff into the lungs daily in the afternoon.   lidocaine (XYLOCAINE) 2 % solution Use as directed 15 mLs in the mouth or throat as needed.   Multiple Vitamins-Minerals (CENTRUM SILVER PO) Take by mouth.   omeprazole (PRILOSEC) 40 MG capsule Take 1 capsule by mouth once daily   promethazine-dextromethorphan (PROMETHAZINE-DM) 6.25-15 MG/5ML syrup TAKE 5 ML BY MOUTH  4 TIMES DAILY AS NEEDED   sucralfate (CARAFATE) 1 GM/10ML suspension TAKE 10 ML BY MOUTH  THREE TIMES DAILY BEFORE MEAL(S)   No facility-administered encounter medications on file as of 05/02/2023.  Allergies (verified) Penicillins   History: Past Medical History:  Diagnosis Date   Acid reflux    Asthma    Asthma    [employer] 05/21/2020   COPD (chronic obstructive pulmonary disease) (HCC)    Depression    Depression    [employer] 05/21/2020   Hypertension    Hypothyroidism    used to take thyroid medication   Oxygen dependent    1-2l/m [place] at night and PRN   Past Surgical History:   Procedure Laterality Date   COLONOSCOPY, ESOPHAGOGASTRODUODENOSCOPY (EGD) AND ESOPHAGEAL DILATION N/A 06/17/2012   EAV:WUJW IN CECUM/Mod IH/ESO ring/Gastritis   Family History  Problem Relation Age of Onset   Asthma Mother    Heart attack Father    Congestive Heart Failure Father    Diabetes Sister    Hyperlipidemia Brother    Hypertension Brother    Asthma Brother    Hypertension Brother    Colon cancer Neg Hx    Inflammatory bowel disease Neg Hx    Social History   Socioeconomic History   Marital status: Single    Spouse name: Not on file   Number of children: 1   Years of education: Not on file   Highest education level: Not on file  Occupational History   Occupation: unemployed  Tobacco Use   Smoking status: Former    Current packs/day: 0.00    Average packs/day: 0.5 packs/day for 22.0 years (11.0 ttl pk-yrs)    Types: Cigarettes    Start date: 01/30/1973    Quit date: 01/31/1995    Years since quitting: 28.2   Smokeless tobacco: Never  Vaping Use   Vaping status: Never Used  Substance and Sexual Activity   Alcohol use: Not Currently    Comment: pt denies   Drug use: No   Sexual activity: Not Currently    Birth control/protection: Post-menopausal  Other Topics Concern   Not on file  Social History Narrative   Daughter present with her today   Social Drivers of Health   Financial Resource Strain: Medium Risk (05/02/2023)   Overall Financial Resource Strain (CARDIA)    Difficulty of Paying Living Expenses: Somewhat hard  Food Insecurity: No Food Insecurity (05/02/2023)   Hunger Vital Sign    Worried About Running Out of Food in the Last Year: Never true    Ran Out of Food in the Last Year: Never true  Transportation Needs: No Transportation Needs (05/02/2023)   PRAPARE - Administrator, Civil Service (Medical): No    Lack of Transportation (Non-Medical): No  Physical Activity: Inactive (05/02/2023)   Exercise Vital Sign    Days of Exercise per Week: 0  days    Minutes of Exercise per Session: 0 min  Stress: Stress Concern Present (05/02/2023)   [employer] of Occupational Health - Occupational Stress Questionnaire    Feeling of Stress : To some extent  Social Connections: Socially Isolated (05/02/2023)   Social Connection and Isolation Panel [NHANES]    Frequency of Communication with Friends and Family: Three times a week    Frequency of Social Gatherings with Friends and Family: Three times a week    Attends Religious Services: Never    Active Member of Clubs or Organizations: No    Attends Banker Meetings: Never    Marital Status: Never married    Tobacco Counseling Counseling given: Not Answered    Clinical Intake:  Pre-visit preparation completed: Yes  Pain : No/denies pain  BMI - recorded: 17.95 Nutritional Status: BMI <19  Underweight Nutritional Risks: None Diabetes: No  Lab Results  Component Value Date   HGBA1C 5.7 (H) 01/18/2023   HGBA1C 5.6 09/22/2022   HGBA1C 5.2 11/11/2019     How often do you need to have someone help you when you read instructions, pamphlets, or other written materials from your doctor or pharmacy?: 1 - Never  Interpreter Needed?: No  Information entered by :: Marvelous Bouwens whitfiled,CMA   Activities of Daily Living     05/02/2023   12:54 PM 09/13/2022    2:08 PM  In your present state of health, do you have any difficulty performing the following activities:  Hearing? 0   Vision? 0   Difficulty concentrating or making decisions? 0   Walking or climbing stairs? 0   Dressing or bathing? 0   Doing errands, shopping? 0 0  Preparing Food and eating ? N   Using the Toilet? N   In the past six months, have you accidently leaked urine? N   Do you have problems with loss of bowel control? N   Managing your Medications? Y   Comment daughter helps   Managing your Finances? Y   Comment daughter helps   Housekeeping or managing your Housekeeping? Y   Comment patient  daughter helps     Patient Care Team: Gilmore Laroche, FNP as PCP - General (Family Medicine) Jonelle Sidle, MD as PCP - Cardiology (Cardiology) West Bali, MD (Inactive) as Consulting Physician (Gastroenterology)  Indicate any recent Medical Services you may have received from other than [facility] providers in the past year (date may be approximate).     Assessment:   This is a routine wellness examination for [facility].  Hearing/Vision screen Hearing Screening - Comments:: No problems Vision Screening - Comments:: No vision problems    Goals Addressed             This Visit's Progress    Medication Management   On track    Patient Goals/Self-Care Activities Patient will:  Take medications as prescribed Collaborate with provider on medication access solutions        Depression Screen     05/02/2023   12:57 PM 01/18/2023    9:12 AM 12/07/2022    1:58 PM 09/22/2022    8:58 AM 04/21/2022    2:52 PM 03/14/2022    3:30 PM 01/19/2022    3:02 PM  PHQ 2/9 Scores  PHQ - 2 Score 0 0 1 0 0 4 1  PHQ- 9 Score 3 0 3  0 10 4    Fall Risk     05/02/2023   12:54 PM 09/22/2022    8:58 AM 04/28/2022    1:44 PM 04/21/2022    2:52 PM 03/14/2022    3:30 PM  Fall Risk   Falls in the past year? 1 0 0 0 0  Number falls in past yr: 0 0 0 0 0  Injury with Fall? 1 0 0 0 0  Risk for fall due to : History of fall(s);Impaired balance/gait No Fall Risks No Fall Risks No Fall Risks   Follow up Falls evaluation completed Falls evaluation completed Falls evaluation completed Falls evaluation completed     MEDICARE RISK AT HOME:  Medicare Risk at Home Any stairs in or around the home?: No If so, are there any without handrails?: No Home free of loose throw rugs in walkways, pet beds, electrical cords, etc?: Yes Adequate lighting in  your home to reduce risk of falls?: Yes Life alert?: No Use of a cane, walker or w/c?: No Grab bars in the bathroom?: No Shower chair or bench in shower?:  No Elevated toilet seat or a handicapped toilet?: No  TIMED UP AND GO:  Was the test performed?  No  Cognitive Function: 6CIT completed        05/02/2023    1:01 PM 04/28/2022    1:44 PM 01/14/2021   10:45 AM  6CIT Screen  What Year? 0 points 0 points 0 points  What month? 0 points 0 points 0 points  What time? 0 points 0 points 0 points  Count back from 20 2 points 0 points 0 points  Months in reverse 0 points 4 points 4 points  Repeat phrase 2 points 2 points 0 points  Total Score 4 points 6 points 4 points    Immunizations Immunization History  Administered Date(s) Administered   Fluad Quad(high Dose 65+) 01/14/2021, 01/19/2022   PFIZER(Purple Top)SARS-COV-2 Vaccination 09/05/2019, 10/10/2019   PNEUMOCOCCAL CONJUGATE-20 04/21/2022   Pneumococcal Polysaccharide-23 09/08/2017   Tdap 09/08/2017   Zoster Recombinant(Shingrix) 09/08/2017    Screening Tests Health Maintenance  Topic Date Due   Zoster Vaccines- Shingrix (2 of 2) 11/03/2017   DEXA SCAN  Never done   MAMMOGRAM  01/14/2022   Colonoscopy  06/18/2022   COVID-19 Vaccine (3 - 2024-25 season) 10/01/2022   INFLUENZA VACCINE  08/31/2023   Medicare Annual Wellness (AWV)  05/01/2024   DTaP/Tdap/Td (2 - Td or Tdap) 09/09/2027   Pneumonia Vaccine 17+ Years old  Completed   Hepatitis C Screening  Completed   HPV VACCINES  Aged Out    Health Maintenance  Health Maintenance Due  Topic Date Due   Zoster Vaccines- Shingrix (2 of 2) 11/03/2017   DEXA SCAN  Never done   MAMMOGRAM  01/14/2022   Colonoscopy  06/18/2022   COVID-19 Vaccine (3 - 2024-25 season) 10/01/2022   Health Maintenance Items Addressed:patient states she has a cologuard kit , declined vaccines at the moment. Ordered mammogram  Additional Screening:  Vision Screening: Recommended annual ophthalmology exams for early detection of glaucoma and other disorders of the eye.  Dental Screening: Recommended annual dental exams for proper oral  hygiene  Community Resource Referral / Chronic Care Management: CRR required this visit?  No   CCM required this visit?  No     Plan:     I have personally reviewed and noted the following in the patient's chart:   Medical and social history Use of alcohol, tobacco or illicit drugs  Current medications and supplements including opioid prescriptions. Patient is not currently taking opioid prescriptions. Functional ability and status Nutritional status Physical activity Advanced directives List of other physicians Hospitalizations, surgeries, and ER visits in previous 12 months Vitals Screenings to include cognitive, depression, and falls Referrals and appointments  In addition, I have reviewed and discussed with patient certain preventive protocols, quality metrics, and best practice recommendations. A written personalized care plan for preventive services as well as general preventive health recommendations were provided to patient.     Rudi Heap, [place]   05/02/2023   After Visit Summary: (MyChart) Due to this being a telephonic visit, the after visit summary with patients personalized plan was offered to patient via MyChart   Notes: Nothing significant to report at this time.

## 2023-05-02 NOTE — Patient Instructions (Signed)
 Grace Cross , Thank you for taking time to come for your Medicare Wellness Visit. I appreciate your ongoing commitment to your health goals. Please review the following plan we discussed and let me know if I can assist you in the future.   Referrals/Orders/Follow-Ups/Clinician Recommendations: follow up in 1 year  This is a list of the screening recommended for you and due dates:  Health Maintenance  Topic Date Due   Zoster (Shingles) Vaccine (2 of 2) 11/03/2017   DEXA scan (bone density measurement)  Never done   Mammogram  01/14/2022   Colon Cancer Screening  06/18/2022   COVID-19 Vaccine (3 - 2024-25 season) 10/01/2022   Flu Shot  08/31/2023   Medicare Annual Wellness Visit  05/01/2024   DTaP/Tdap/Td vaccine (2 - Td or Tdap) 09/09/2027   Pneumonia Vaccine  Completed   Hepatitis C Screening  Completed   HPV Vaccine  Aged Out    Advanced directives: (Declined) Advance directive discussed with you today. Even though you declined this today, please call our office should you change your mind, and we can give you the proper paperwork for you to fill out.  Next Medicare Annual Wellness Visit scheduled for next year: Yes

## 2023-05-03 ENCOUNTER — Other Ambulatory Visit: Payer: Self-pay | Admitting: Family Medicine

## 2023-05-03 DIAGNOSIS — R058 Other specified cough: Secondary | ICD-10-CM

## 2023-05-03 MED ORDER — PROMETHAZINE-DM 6.25-15 MG/5ML PO SYRP: 5.0000 mL | ORAL_SOLUTION | Freq: Four times a day (QID) | ORAL | 0 refills | Status: DC | PRN

## 2023-05-04 ENCOUNTER — Telehealth: Payer: Self-pay

## 2023-05-04 MED ORDER — SUCRALFATE 1 G PO TABS: 1.0000 g | ORAL_TABLET | Freq: Three times a day (TID) | ORAL | 3 refills | Status: DC

## 2023-05-04 NOTE — Addendum Note (Signed)
 Addended by: Tiffany Kocher on: 05/04/2023 11:53 AM   Modules accepted: Orders

## 2023-05-04 NOTE — Telephone Encounter (Signed)
 Pt's daughter called stating that the pharmacy is requesting an Rx for the disintegrating tablet of carafate to be called in instead of the suspension due to insurance not covering the suspension. Pt is completely out and is starting to have diarrhea again due to being off of it according to pt's daughter.

## 2023-05-04 NOTE — Telephone Encounter (Signed)
 I don't know anything about disintegrated pills. I have sent in pill version. She can swallow whole or make a slurry.

## 2023-05-04 NOTE — Telephone Encounter (Signed)
Pt's daughter was made aware and verbalized understanding.  

## 2023-05-04 NOTE — Telephone Encounter (Signed)
 Pts daughter advised with verbal understanding

## 2023-05-09 NOTE — Progress Notes (Signed)
 This visit was performed by a medical professional under my direct supervision. I was immediately available for consultation/collaboration. I have reviewed and agree with the Annual Wellness Visit documentation.

## 2023-05-21 ENCOUNTER — Ambulatory Visit: Payer: Medicare Other | Admitting: Family Medicine

## 2023-05-25 ENCOUNTER — Other Ambulatory Visit: Payer: Self-pay | Admitting: Family Medicine

## 2023-05-25 DIAGNOSIS — R058 Other specified cough: Secondary | ICD-10-CM

## 2023-05-29 ENCOUNTER — Encounter: Payer: Self-pay | Admitting: Family Medicine

## 2023-06-25 ENCOUNTER — Other Ambulatory Visit: Payer: Self-pay | Admitting: Family Medicine

## 2023-06-25 DIAGNOSIS — K219 Gastro-esophageal reflux disease without esophagitis: Secondary | ICD-10-CM

## 2023-06-27 ENCOUNTER — Other Ambulatory Visit: Payer: Self-pay | Admitting: Family Medicine

## 2023-07-02 ENCOUNTER — Other Ambulatory Visit: Payer: Self-pay | Admitting: Family Medicine

## 2023-07-02 ENCOUNTER — Other Ambulatory Visit: Payer: Self-pay

## 2023-07-02 DIAGNOSIS — R058 Other specified cough: Secondary | ICD-10-CM

## 2023-07-02 DIAGNOSIS — J4520 Mild intermittent asthma, uncomplicated: Secondary | ICD-10-CM

## 2023-07-02 MED ORDER — ALBUTEROL SULFATE HFA 108 (90 BASE) MCG/ACT IN AERS: 2.0000 | INHALATION_SPRAY | Freq: Four times a day (QID) | RESPIRATORY_TRACT | 2 refills | Status: AC | PRN

## 2023-07-02 NOTE — Telephone Encounter (Unsigned)
 Copied from CRM [id number]. Topic: Clinical - Medication Refill >> Jul 02, 2023 11:07 AM Emylou G wrote: Medication: promethazine -dextromethorphan (PROMETHAZINE -DM) 6.25-15 MG/5ML syrup  Has the patient contacted their pharmacy? Yes (Agent: If no, request that the patient contact the pharmacy for the refill. If patient does not wish to contact the pharmacy document the reason why and proceed with request.) (Agent: If yes, when and what did the pharmacy advise?)  This is the patient's preferred pharmacy:  [facility] [address] [place] - [address] [place] [address] Phone: [phone] Fax: [phone]    Is this the correct pharmacy for this prescription? Yes If no, delete pharmacy and type the correct one.   Has the prescription been filled recently? Yes  Is the patient out of the medication? Yes  Has the patient been seen for an appointment in the last year OR does the patient have an upcoming appointment? Yes  Can we respond through MyChart? No  Agent: Please be advised that Rx refills may take up to 3 business days. We ask that you follow-up with your pharmacy.

## 2023-07-06 MED ORDER — PROMETHAZINE-DM 6.25-15 MG/5ML PO SYRP: 5.0000 mL | ORAL_SOLUTION | Freq: Four times a day (QID) | ORAL | 0 refills | Status: DC | PRN

## 2023-08-17 ENCOUNTER — Ambulatory Visit: Payer: Self-pay

## 2023-08-17 NOTE — Telephone Encounter (Signed)
 Daughter advised patient to go to urgent care

## 2023-08-17 NOTE — Telephone Encounter (Signed)
 FYI Only or Action Required?: Action required by provider: request for appointment.  Patient was last seen in primary care on 01/18/2023 by Zarwolo, Gloria, FNP.  Called Nurse Triage reporting Cough.  Symptoms began several weeks ago.  Interventions attempted: Nothing.  Symptoms are: unchanged. Non-productive cough x 2 weeks with back pain. Asking to be worked in, no availability. Advise daughter. Will go to UC for worsening of symptoms.  Triage Disposition: See HCP Within 4 Hours (Or PCP Triage)  Patient/caregiver understands and will follow disposition?: YesCopied from CRM [id number]. Topic: Clinical - Red Word Triage >> Aug 17, 2023 10:07 AM Larissa RAMAN wrote: [facility] that prompted transfer to Nurse Triage: cough, back pain Reason for Disposition  [1] MILD difficulty breathing (e.g., minimal/no SOB at rest, SOB with walking, pulse < 100) AND [2] still present when not coughing  Answer Assessment - Initial Assessment Questions 1. ONSET: When did the cough begin?      2 weeks 2. SEVERITY: How bad is the cough today?      moderate 3. SPUTUM: Describe the color of your sputum (e.g., none, dry cough; clear, white, yellow, green)     none 4. HEMOPTYSIS: Are you coughing up any blood? If Yes, ask: How much? (e.g., flecks, streaks, tablespoons, etc.)     no 5. DIFFICULTY BREATHING: Are you having difficulty breathing? If Yes, ask: How bad is it? (e.g., mild, moderate, severe)      mild 6. FEVER: Do you have a fever? If Yes, ask: What is your temperature, how was it measured, and when did it start?     no 7. CARDIAC HISTORY: Do you have any history of heart disease? (e.g., heart attack, congestive heart failure)      no 8. LUNG HISTORY: Do you have any history of lung disease?  (e.g., pulmonary embolus, asthma, emphysema)     COPD 9. PE RISK FACTORS: Do you have a history of blood clots? (or: recent major surgery, recent prolonged travel, bedridden)     NO 10.  OTHER SYMPTOMS: Do you have any other symptoms? (e.g., runny nose, wheezing, chest pain)       MILD WHEEZING 11. PREGNANCY: Is there any chance you are pregnant? When was your last menstrual period?       no 12. TRAVEL: Have you traveled out of the country in the last month? (e.g., travel history, exposures)       no  Protocols used: Cough - Acute Non-Productive-A-AH

## 2023-09-03 ENCOUNTER — Ambulatory Visit (INDEPENDENT_AMBULATORY_CARE_PROVIDER_SITE_OTHER): Admitting: Family Medicine

## 2023-09-03 ENCOUNTER — Encounter: Payer: Self-pay | Admitting: Family Medicine

## 2023-09-03 VITALS — BP 89/68 | HR 90 | Resp 16 | Ht 61.0 in | Wt 104.1 lb

## 2023-09-03 DIAGNOSIS — E038 Other specified hypothyroidism: Secondary | ICD-10-CM

## 2023-09-03 DIAGNOSIS — E559 Vitamin D deficiency, unspecified: Secondary | ICD-10-CM | POA: Diagnosis not present

## 2023-09-03 DIAGNOSIS — R7301 Impaired fasting glucose: Secondary | ICD-10-CM | POA: Diagnosis not present

## 2023-09-03 DIAGNOSIS — E7849 Other hyperlipidemia: Secondary | ICD-10-CM

## 2023-09-03 DIAGNOSIS — R058 Other specified cough: Secondary | ICD-10-CM

## 2023-09-03 MED ORDER — PROMETHAZINE-DM 6.25-15 MG/5ML PO SYRP: 5.0000 mL | ORAL_SOLUTION | Freq: Four times a day (QID) | ORAL | 0 refills | Status: DC | PRN

## 2023-09-03 MED ORDER — PREDNISONE 20 MG PO TABS: 40.0000 mg | ORAL_TABLET | Freq: Every day | ORAL | 0 refills | Status: AC

## 2023-09-03 NOTE — Assessment & Plan Note (Signed)
 Encouraged to please stop by [facility] to get a chest x-ray to rule out pneumonia or other structural lung issues, such as a collapsed lung. Continue using your daily inhaler. Start taking prednisone  40 mg daily for 5 days. Take Tylenol  as needed for pain relief. Continue taking Promethazine  DM for your cough. Perform warm saltwater gargles 3-4 times daily to help with throat pain or discomfort. (Mix 1/2 teaspoon of salt in a glass of warm water  and gargle several times daily to reduce throat inflammation and soothe irritation.) Increase fluid intake and allow for plenty of rest.

## 2023-09-03 NOTE — Patient Instructions (Addendum)
 I appreciate the opportunity to provide care to you today!    Follow up:  4 months  Labs: please stop by the lab today to get your blood drawn (CBC, CMP, TSH, Lipid profile, HgA1c, Vit D)  Cough Please stop by [facility] to get a chest x-ray to rule out pneumonia or other structural lung issues, such as a collapsed lung. Continue using your daily inhaler. Start taking prednisone  40 mg daily for 5 days. Take Tylenol  as needed for pain relief. Continue taking Promethazine  DM for your cough. Perform warm saltwater gargles 3-4 times daily to help with throat pain or discomfort. (Mix 1/2 teaspoon of salt in a glass of warm water  and gargle several times daily to reduce throat inflammation and soothe irritation.) Increase fluid intake and allow for plenty of rest.   Please follow up if your symptoms worsen or fail to improve.   Please continue to a heart-healthy diet and increase your physical activities. Try to exercise for at least five days a week.    It was a pleasure to see you and I look forward to continuing to work together on your health and well-being. Please do not hesitate to call the office if you need care or have questions about your care.  In case of emergency, please visit the Emergency Department for urgent care, or contact our clinic at [phone] to schedule an appointment. We're here to help you!   Have a wonderful day and week. With Gratitude, Braelyn Jenson MSN, FNP-BC

## 2023-09-03 NOTE — Progress Notes (Signed)
 Established Patient Office Visit  Subjective:  Patient ID: Grace Cross, female    DOB: 11-May-1955  Age: 68 y.o. MRN: [id number]  CC:  Chief Complaint  Patient presents with   Depression    4 month follow up    Cough    Dry cough x2 weeks. States she is cough so much her lungs hurt. Has tried otc cough medications but no relief     HPI Grace Cross is a 68 y.o. female with past medical history of asthma, COPD, esophageal is dysphagia presents for f/u of  chronic medical conditions.  The patient presents today with a two-week history of nonproductive cough and sore throat. She reports the cough occurs throughout the day and is not associated with fever or chills. She does report occasional shortness of breath, wheezing, and increased use of her inhaler. Additionally, she experiences chest discomfort with deep inhalation and exhalation. The visit was conducted with her daughter present, who expressed concern due to the patient's history of frequent pneumonia and requested an evaluation for a possible recurrence.  Past Medical History:  Diagnosis Date   Acid reflux    Asthma    Asthma    Phreesia 05/21/2020   COPD (chronic obstructive pulmonary disease) (HCC)    Depression    Depression    Phreesia 05/21/2020   Hypertension    Hypothyroidism    used to take thyroid  medication   Oxygen dependent    1-2l/m [place] at night and PRN    Past Surgical History:  Procedure Laterality Date   COLONOSCOPY, ESOPHAGOGASTRODUODENOSCOPY (EGD) AND ESOPHAGEAL DILATION N/A 06/17/2012   DOQ:UPRD IN CECUM/Mod IH/ESO ring/Gastritis    Family History  Problem Relation Age of Onset   Asthma Mother    Heart attack Father    Congestive Heart Failure Father    Diabetes Sister    Hyperlipidemia Brother    Hypertension Brother    Asthma Brother    Hypertension Brother    Colon cancer Neg Hx    Inflammatory bowel disease Neg Hx     Social History   Socioeconomic History   Marital status:  Single    Spouse name: Not on file   Number of children: 1   Years of education: Not on file   Highest education level: Not on file  Occupational History   Occupation: unemployed  Tobacco Use   Smoking status: Former    Current packs/day: 0.00    Average packs/day: 0.5 packs/day for 22.0 years (11.0 ttl pk-yrs)    Types: Cigarettes    Start date: 01/30/1973    Quit date: 01/31/1995    Years since quitting: 28.6   Smokeless tobacco: Never  Vaping Use   Vaping status: Never Used  Substance and Sexual Activity   Alcohol use: Not Currently    Comment: pt denies   Drug use: No   Sexual activity: Not Currently    Birth control/protection: Post-menopausal  Other Topics Concern   Not on file  Social History Narrative   Daughter present with her today   Social Drivers of Health   Financial Resource Strain: Medium Risk (05/02/2023)   Overall Financial Resource Strain (CARDIA)    Difficulty of Paying Living Expenses: Somewhat hard  Food Insecurity: No Food Insecurity (05/02/2023)   Hunger Vital Sign    Worried About Running Out of Food in the Last Year: Never true    Ran Out of Food in the Last Year: Never true  Transportation Needs: No  Transportation Needs (05/02/2023)   PRAPARE - Administrator, Civil Service (Medical): No    Lack of Transportation (Non-Medical): No  Physical Activity: Inactive (05/02/2023)   Exercise Vital Sign    Days of Exercise per Week: 0 days    Minutes of Exercise per Session: 0 min  Stress: Stress Concern Present (05/02/2023)   [employer] of Occupational Health - Occupational Stress Questionnaire    Feeling of Stress : To some extent  Social Connections: Socially Isolated (05/02/2023)   Social Connection and Isolation Panel    Frequency of Communication with Friends and Family: Three times a week    Frequency of Social Gatherings with Friends and Family: Three times a week    Attends Religious Services: Never    Active Member of Clubs or  Organizations: No    Attends Banker Meetings: Never    Marital Status: Never married  Intimate Partner Violence: Not At Risk (05/02/2023)   Humiliation, Afraid, Rape, and Kick questionnaire    Fear of Current or Ex-Partner: No    Emotionally Abused: No    Physically Abused: No    Sexually Abused: No    Outpatient Medications Prior to Visit  Medication Sig Dispense Refill   acetaminophen  (TYLENOL ) 500 MG tablet Take 500 mg by mouth every 6 (six) hours as needed.     albuterol  (VENTOLIN  HFA) 108 (90 Base) MCG/ACT inhaler Inhale 2 puffs into the lungs every 6 (six) hours as needed for wheezing or shortness of breath. 8 g 2   citalopram  (CELEXA ) 20 MG tablet Take 1 tablet (20 mg total) by mouth daily. 90 tablet 2   fluticasone  (FLONASE ) 50 MCG/ACT nasal spray Place 2 sprays into both nostrils daily. 16 g 6   Fluticasone  Furoate-Vilanterol (BREO ELLIPTA ) 50-25 MCG/ACT AEPB Inhale 1 puff into the lungs daily in the afternoon. 60 each 2   lidocaine  (XYLOCAINE ) 2 % solution Use as directed 15 mLs in the mouth or throat as needed. 100 mL 0   Multiple Vitamins-Minerals (CENTRUM SILVER PO) Take by mouth.     omeprazole  (PRILOSEC) 40 MG capsule Take 1 capsule by mouth once daily 30 capsule 3   sucralfate  (CARAFATE ) 1 g tablet Take 1 tablet (1 g total) by mouth 3 (three) times daily before meals. 90 tablet 3   promethazine -dextromethorphan (PROMETHAZINE -DM) 6.25-15 MG/5ML syrup Take 5 mLs by mouth 4 (four) times daily as needed for cough. 118 mL 0   No facility-administered medications prior to visit.    Allergies  Allergen Reactions   Penicillins Rash    ROS Review of Systems  Constitutional:  Negative for chills and fever.  HENT:  Positive for sore throat. Negative for congestion.   Eyes:  Negative for visual disturbance.  Respiratory:  Positive for cough and wheezing. Negative for chest tightness and shortness of breath.   Neurological:  Negative for dizziness and headaches.       Objective:    Physical Exam HENT:     Head: Normocephalic.     Mouth/Throat:     Mouth: Mucous membranes are moist.  Cardiovascular:     Rate and Rhythm: Normal rate.     Heart sounds: Normal heart sounds.  Pulmonary:     Effort: Pulmonary effort is normal.     Breath sounds: Normal breath sounds. No wheezing, rhonchi or rales.  Chest:     Chest wall: No tenderness.  Neurological:     Mental Status: She is alert.  BP (!) 89/68   Pulse 90   Resp 16   Ht 5' 1 (1.549 m)   Wt 104 lb 1.3 oz (47.2 kg)   SpO2 90%   BMI 19.67 kg/m  Wt Readings from Last 3 Encounters:  09/03/23 104 lb 1.3 oz (47.2 kg)  05/02/23 95 lb (43.1 kg)  01/18/23 95 lb 1.9 oz (43.1 kg)    Lab Results  Component Value Date   TSH 1.350 01/18/2023   Lab Results  Component Value Date   WBC 3.9 01/18/2023   HGB 11.6 01/18/2023   HCT 36.2 01/18/2023   MCV 95 01/18/2023   PLT 349 01/18/2023   Lab Results  Component Value Date   NA 141 01/18/2023   K 4.6 01/18/2023   CO2 25 01/18/2023   GLUCOSE 84 01/18/2023   BUN 20 01/18/2023   CREATININE 1.44 (H) 01/18/2023   BILITOT 0.4 01/18/2023   ALKPHOS 135 (H) 01/18/2023   AST 28 01/18/2023   ALT 22 01/18/2023   PROT 7.7 01/18/2023   ALBUMIN  3.9 01/18/2023   CALCIUM  9.7 01/18/2023   ANIONGAP 8 03/13/2022   EGFR 40 (L) 01/18/2023   Lab Results  Component Value Date   CHOL 174 01/18/2023   Lab Results  Component Value Date   HDL 72 01/18/2023   Lab Results  Component Value Date   LDLCALC 86 01/18/2023   Lab Results  Component Value Date   TRIG 91 01/18/2023   Lab Results  Component Value Date   CHOLHDL 2.4 01/18/2023   Lab Results  Component Value Date   HGBA1C 5.7 (H) 01/18/2023      Assessment & Plan:  Other cough Assessment & Plan: Encouraged to please stop by [facility] to get a chest x-ray to rule out pneumonia or other structural lung issues, such as a collapsed lung. Continue using your daily  inhaler. Start taking prednisone  40 mg daily for 5 days. Take Tylenol  as needed for pain relief. Continue taking Promethazine  DM for your cough. Perform warm saltwater gargles 3-4 times daily to help with throat pain or discomfort. (Mix 1/2 teaspoon of salt in a glass of warm water  and gargle several times daily to reduce throat inflammation and soothe irritation.) Increase fluid intake and allow for plenty of rest.   Orders: -     DG Chest 2 View -     predniSONE ; Take 2 tablets (40 mg total) by mouth daily for 5 days.  Dispense: 10 tablet; Refill: 0 -     Promethazine -DM; Take 5 mLs by mouth 4 (four) times daily as needed for cough.  Dispense: 118 mL; Refill: 0  IFG (impaired fasting glucose) -     Hemoglobin A1c  Vitamin D  deficiency -     VITAMIN D  25 Hydroxy (Vit-D Deficiency, Fractures)  TSH (thyroid -stimulating hormone deficiency) -     TSH + free T4  Other hyperlipidemia -     Lipid panel -     CMP14+EGFR -     CBC with Differential/Platelet  Note: This chart has been completed using Engineer, civil (consulting) software, and while attempts have been made to ensure accuracy, certain words and phrases may not be transcribed as intended.    Follow-up: Return in about 4 months (around 01/03/2024).   Iden Stripling, FNP

## 2023-09-04 LAB — LIPID PANEL
Chol/HDL Ratio: 2.5 ratio (ref 0.0–4.4)
Cholesterol, Total: 150 mg/dL (ref 100–199)
HDL: 61 mg/dL (ref >39)
LDL Chol Calc (NIH): 67 mg/dL (ref 0–99)
Triglycerides: 127 mg/dL (ref 0–149)
VLDL Cholesterol Cal: 22 mg/dL (ref 5–40)

## 2023-09-04 LAB — CBC WITH DIFFERENTIAL/PLATELET
Basophils Absolute: 0 x10E3/uL (ref 0.0–0.2)
Basos: 1 %
EOS (ABSOLUTE): 0.1 x10E3/uL (ref 0.0–0.4)
Eos: 2 %
Hematocrit: 37.9 % (ref 34.0–46.6)
Hemoglobin: 12.2 g/dL (ref 11.1–15.9)
Immature Grans (Abs): 0 x10E3/uL (ref 0.0–0.1)
Immature Granulocytes: 0 %
Lymphocytes Absolute: 1.9 x10E3/uL (ref 0.7–3.1)
Lymphs: 41 %
MCH: 30.2 pg (ref 26.6–33.0)
MCHC: 32.2 g/dL (ref 31.5–35.7)
MCV: 94 fL (ref 79–97)
Monocytes Absolute: 0.6 x10E3/uL (ref 0.1–0.9)
Monocytes: 12 %
Neutrophils Absolute: 2.1 x10E3/uL (ref 1.4–7.0)
Neutrophils: 44 %
Platelets: 301 x10E3/uL (ref 150–450)
RBC: 4.04 x10E6/uL (ref 3.77–5.28)
RDW: 12.8 % (ref 11.7–15.4)
WBC: 4.7 x10E3/uL (ref 3.4–10.8)

## 2023-09-04 LAB — CMP14+EGFR
ALT: 14 IU/L (ref 0–32)
AST: 22 IU/L (ref 0–40)
Albumin: 3.8 g/dL — ABNORMAL LOW (ref 3.9–4.9)
Alkaline Phosphatase: 135 IU/L — ABNORMAL HIGH (ref 44–121)
BUN/Creatinine Ratio: 14 (ref 12–28)
BUN: 18 mg/dL (ref 8–27)
Bilirubin Total: 0.3 mg/dL (ref 0.0–1.2)
CO2: 24 mmol/L (ref 20–29)
Calcium: 9.2 mg/dL (ref 8.7–10.3)
Chloride: 101 mmol/L (ref 96–106)
Creatinine, Ser: 1.33 mg/dL — ABNORMAL HIGH (ref 0.57–1.00)
Globulin, Total: 4.4 g/dL (ref 1.5–4.5)
Glucose: 83 mg/dL (ref 70–99)
Potassium: 4 mmol/L (ref 3.5–5.2)
Sodium: 140 mmol/L (ref 134–144)
Total Protein: 8.2 g/dL (ref 6.0–8.5)
eGFR: 44 mL/min/1.73 — ABNORMAL LOW (ref >59)

## 2023-09-04 LAB — VITAMIN D 25 HYDROXY (VIT D DEFICIENCY, FRACTURES): Vit D, 25-Hydroxy: 35.3 ng/mL (ref 30.0–100.0)

## 2023-09-04 LAB — HEMOGLOBIN A1C
Est. average glucose Bld gHb Est-mCnc: 117 mg/dL
Hgb A1c MFr Bld: 5.7 % — ABNORMAL HIGH (ref 4.8–5.6)

## 2023-09-04 LAB — TSH+FREE T4
Free T4: 1.1 ng/dL (ref 0.82–1.77)
TSH: 1.32 u[IU]/mL (ref 0.450–4.500)

## 2023-09-06 ENCOUNTER — Ambulatory Visit: Payer: Self-pay | Admitting: Family Medicine

## 2023-09-27 ENCOUNTER — Other Ambulatory Visit: Payer: Self-pay | Admitting: Family Medicine

## 2023-09-27 DIAGNOSIS — K219 Gastro-esophageal reflux disease without esophagitis: Secondary | ICD-10-CM

## 2023-09-27 MED ORDER — OMEPRAZOLE 40 MG PO CPDR: 40.0000 mg | DELAYED_RELEASE_CAPSULE | Freq: Every day | ORAL | 3 refills | Status: DC

## 2023-09-27 NOTE — Telephone Encounter (Signed)
 Copied from CRM #[id number]. Topic: Clinical - Medication Refill >> Sep 27, 2023 12:03 PM Dawna HERO wrote: Medication: omeprazole  (PRILOSEC) 40 MG capsule  Has the patient contacted their pharmacy? Yes,told to contact provider (Agent: If no, request that the patient contact the pharmacy for the refill. If patient does not wish to contact the pharmacy document the reason why and proceed with request.) (Agent: If yes, when and what did the pharmacy advise?)  This is the patient's preferred pharmacy:  [facility] [address] [place] - [address] [place] [address] Phone: [phone] Fax: [phone]  Is this the correct pharmacy for this prescription? Yes If no, delete pharmacy and type the correct one.   Has the prescription been filled recently? No  Is the patient out of the medication? Yes  Has the patient been seen for an appointment in the last year OR does the patient have an upcoming appointment? Yes  Can we respond through MyChart? No  Agent: Please be advised that Rx refills may take up to 3 business days. We ask that you follow-up with your pharmacy.

## 2023-10-25 ENCOUNTER — Other Ambulatory Visit: Payer: Self-pay | Admitting: Family Medicine

## 2023-10-25 DIAGNOSIS — R058 Other specified cough: Secondary | ICD-10-CM

## 2023-11-01 ENCOUNTER — Other Ambulatory Visit: Payer: Self-pay | Admitting: Gastroenterology

## 2023-11-02 ENCOUNTER — Telehealth: Payer: Self-pay

## 2023-11-02 NOTE — Telephone Encounter (Signed)
 Pt is needing refills sent in of sucralfate . Only has two left and daughter states that she is starting to gain weight.

## 2023-11-02 NOTE — Telephone Encounter (Signed)
 Rx sent  She needs ov with [facility], not seen since 07/2022. First available.

## 2023-11-02 NOTE — Telephone Encounter (Signed)
 Pt's daughter was made aware and was transferred to the front for scheduling.

## 2023-11-05 ENCOUNTER — Ambulatory Visit: Admitting: Gastroenterology

## 2023-11-05 NOTE — Progress Notes (Deleted)
 GI Office Note    Referring Provider: Edman Meade PEDLAR, FNP Primary Care Physician:  Edman Meade PEDLAR, FNP  Primary Gastroenterologist:  Chief Complaint   No chief complaint on file.   History of Present Illness   Grace Cross is a 68 y.o. female presenting today     Prior Data        Medications   Current Outpatient Medications  Medication Sig Dispense Refill   acetaminophen  (TYLENOL ) 500 MG tablet Take 500 mg by mouth every 6 (six) hours as needed.     albuterol  (VENTOLIN  HFA) 108 (90 Base) MCG/ACT inhaler Inhale 2 puffs into the lungs every 6 (six) hours as needed for wheezing or shortness of breath. 8 g 2   citalopram  (CELEXA ) 20 MG tablet Take 1 tablet (20 mg total) by mouth daily. 90 tablet 2   fluticasone  (FLONASE ) 50 MCG/ACT nasal spray Place 2 sprays into both nostrils daily. 16 g 6   Fluticasone  Furoate-Vilanterol (BREO ELLIPTA ) 50-25 MCG/ACT AEPB Inhale 1 puff into the lungs daily in the afternoon. 60 each 2   lidocaine  (XYLOCAINE ) 2 % solution Use as directed 15 mLs in the mouth or throat as needed. 100 mL 0   Multiple Vitamins-Minerals (CENTRUM SILVER PO) Take by mouth.     omeprazole  (PRILOSEC) 40 MG capsule Take 1 capsule (40 mg total) by mouth daily. 30 capsule 3   promethazine -dextromethorphan (PROMETHAZINE -DM) 6.25-15 MG/5ML syrup TAKE 5 ML BY MOUTH  4 TIMES DAILY AS NEEDED FOR COUGH 118 mL 0   sucralfate  (CARAFATE ) 1 g tablet TAKE 1 TABLET BY MOUTH THREE TIMES DAILY BEFORE MEAL(S) 90 tablet 1   No current facility-administered medications for this visit.    Allergies   Allergies as of 11/05/2023 - Review Complete 09/03/2023  Allergen Reaction Noted   Penicillins Rash 03/17/2011     Past Medical History   Past Medical History:  Diagnosis Date   Acid reflux    Asthma    Asthma    Phreesia 05/21/2020   COPD (chronic obstructive pulmonary disease) (HCC)    Depression    Depression    Phreesia 05/21/2020   Hypertension     Hypothyroidism    used to take thyroid  medication   Oxygen dependent    1-2l/m [place] at night and PRN    Past Surgical History   Past Surgical History:  Procedure Laterality Date   COLONOSCOPY, ESOPHAGOGASTRODUODENOSCOPY (EGD) AND ESOPHAGEAL DILATION N/A 06/17/2012   DOQ:UPRD IN CECUM/Mod IH/ESO ring/Gastritis    Past Family History   Family History  Problem Relation Age of Onset   Asthma Mother    Heart attack Father    Congestive Heart Failure Father    Diabetes Sister    Hyperlipidemia Brother    Hypertension Brother    Asthma Brother    Hypertension Brother    Colon cancer Neg Hx    Inflammatory bowel disease Neg Hx     Past Social History   Social History   Socioeconomic History   Marital status: Single    Spouse name: Not on file   Number of children: 1   Years of education: Not on file   Highest education level: Not on file  Occupational History   Occupation: unemployed  Tobacco Use   Smoking status: Former    Current packs/day: 0.00    Average packs/day: 0.5 packs/day for 22.0 years (11.0 ttl pk-yrs)    Types: Cigarettes    Start date: 01/30/1973  Quit date: 01/31/1995    Years since quitting: 28.7   Smokeless tobacco: Never  Vaping Use   Vaping status: Never Used  Substance and Sexual Activity   Alcohol use: Not Currently    Comment: pt denies   Drug use: No   Sexual activity: Not Currently    Birth control/protection: Post-menopausal  Other Topics Concern   Not on file  Social History Narrative   Daughter present with her today   Social Drivers of Health   Financial Resource Strain: Medium Risk (05/02/2023)   Overall Financial Resource Strain (CARDIA)    Difficulty of Paying Living Expenses: Somewhat hard  Food Insecurity: No Food Insecurity (05/02/2023)   Hunger Vital Sign    Worried About Running Out of Food in the Last Year: Never true    Ran Out of Food in the Last Year: Never true  Transportation Needs: No Transportation Needs (05/02/2023)    PRAPARE - Administrator, Civil Service (Medical): No    Lack of Transportation (Non-Medical): No  Physical Activity: Inactive (05/02/2023)   Exercise Vital Sign    Days of Exercise per Week: 0 days    Minutes of Exercise per Session: 0 min  Stress: Stress Concern Present (05/02/2023)   [employer] of Occupational Health - Occupational Stress Questionnaire    Feeling of Stress : To some extent  Social Connections: Socially Isolated (05/02/2023)   Social Connection and Isolation Panel    Frequency of Communication with Friends and Family: Three times a week    Frequency of Social Gatherings with Friends and Family: Three times a week    Attends Religious Services: Never    Active Member of Clubs or Organizations: No    Attends Banker Meetings: Never    Marital Status: Never married  Intimate Partner Violence: Not At Risk (05/02/2023)   Humiliation, Afraid, Rape, and Kick questionnaire    Fear of Current or Ex-Partner: No    Emotionally Abused: No    Physically Abused: No    Sexually Abused: No    Review of Systems   General: Negative for anorexia, weight loss, fever, chills, fatigue, weakness. ENT: Negative for hoarseness, difficulty swallowing , nasal congestion. CV: Negative for chest pain, angina, palpitations, dyspnea on exertion, peripheral edema.  Respiratory: Negative for dyspnea at rest, dyspnea on exertion, cough, sputum, wheezing.  GI: See history of present illness. GU:  Negative for dysuria, hematuria, urinary incontinence, urinary frequency, nocturnal urination.  Endo: Negative for unusual weight change.     Physical Exam   There were no vitals taken for this visit.   General: Well-nourished, well-developed in no acute distress.  Eyes: No icterus. Mouth: Oropharyngeal mucosa moist and pink   Lungs: Clear to auscultation bilaterally.  Heart: Regular rate and rhythm, no murmurs rubs or gallops.  Abdomen: Bowel sounds are normal,  nontender, nondistended, no hepatosplenomegaly or masses,  no abdominal bruits or hernia , no rebound or guarding.  Rectal: not performed Extremities: No lower extremity edema. No clubbing or deformities. Neuro: Alert and oriented x 4   Skin: Warm and dry, no jaundice.   Psych: Alert and cooperative, normal mood and affect.  Labs   *** Imaging Studies   No results found.  Assessment/Plan:           Sonny RAMAN. Ezzard, MHS, PA-C [facility]

## 2023-11-20 ENCOUNTER — Other Ambulatory Visit: Payer: Self-pay | Admitting: Family Medicine

## 2023-11-20 DIAGNOSIS — F339 Major depressive disorder, recurrent, unspecified: Secondary | ICD-10-CM

## 2023-12-05 ENCOUNTER — Ambulatory Visit: Admitting: Internal Medicine

## 2023-12-19 ENCOUNTER — Ambulatory Visit: Admitting: Gastroenterology

## 2023-12-19 ENCOUNTER — Encounter: Payer: Self-pay | Admitting: Gastroenterology

## 2023-12-19 VITALS — BP 98/78 | HR 84 | Temp 98.1°F | Ht 61.0 in | Wt 107.0 lb

## 2023-12-19 DIAGNOSIS — R0789 Other chest pain: Secondary | ICD-10-CM

## 2023-12-19 DIAGNOSIS — R42 Dizziness and giddiness: Secondary | ICD-10-CM

## 2023-12-19 DIAGNOSIS — K219 Gastro-esophageal reflux disease without esophagitis: Secondary | ICD-10-CM

## 2023-12-19 DIAGNOSIS — R1319 Other dysphagia: Secondary | ICD-10-CM

## 2023-12-19 DIAGNOSIS — I9589 Other hypotension: Secondary | ICD-10-CM

## 2023-12-19 DIAGNOSIS — I959 Hypotension, unspecified: Secondary | ICD-10-CM | POA: Insufficient documentation

## 2023-12-19 DIAGNOSIS — K580 Irritable bowel syndrome with diarrhea: Secondary | ICD-10-CM

## 2023-12-19 MED ORDER — SUCRALFATE 1 G PO TABS: 1.0000 g | ORAL_TABLET | Freq: Three times a day (TID) | ORAL | 3 refills | Status: AC

## 2023-12-19 NOTE — Patient Instructions (Signed)
 We will work on getting you scheduled for upper endoscopy once your blood pressure issues have improved. Right now your low blood pressure can be problematic with anesthesia.   Try to increase your fluid intake to at least four bottles of fluid each day.   I have refilled your carafate  today.

## 2023-12-19 NOTE — Progress Notes (Signed)
 GI Office Note    Referring Provider: Edman Meade PEDLAR, FNP Primary Care Physician:  Edman Meade PEDLAR, FNP  Primary Gastroenterologist: Carlin POUR. Cindie, DO   Chief Complaint   Chief Complaint  Patient presents with   Follow-up    Here for further refills on sucralfate     History of Present Illness   Grace Cross is a 68 y.o. female presenting today for follow up for refills. Last seen in 07/2022. She has history of IBS-d, GERD, dysphagia.   Discussed the use of AI scribe software for clinical note transcription with the patient, who gave verbal consent to proceed.  History of Present Illness Grace Cross is a 68 year old female who presents with dizziness and swallowing difficulties. She is accompanied by her daughter.  She experiences frequent dizziness, particularly when transitioning from sitting to standing. She has a history of low blood pressure and was previously diagnosed with hypertension but was taken off medication due to excessively low readings. She does not currently monitor her blood pressure at home regularly. She often feels dizzy and experiences nocturnal chest pain that wakes her from sleep.  She has a history of dysphagia, noting that food, particularly bread and meats, often feels stuck in her throat. This issue has persisted for some time, and she recalls undergoing esophageal dilation approximately ten years ago. She reports that liquids sometimes also cause difficulty, impacting her ability to eat and drink adequately. Despite these issues, she has gained weight recently.  She experiences frequent diarrhea, occurring several times a week, with bowel movements up to three times a day during these episodes. On days without diarrhea, she does not have bowel movements. She takes sucralfate  three times daily for stomach pain and has noted diarrhea when attempting to discontinue it. She avoids spicy foods as they exacerbate her diarrhea.  She has a history  of lung disease and was previously on oxygen therapy, but is not currently using it. No recent significant respiratory infections, but she mentions having a cough for which she was prescribed a medication containing promethazine  and dextromethorphan.  Her hydration is suboptimal, typically consuming about 32 ounces of water  daily, which is below the recommended intake for her weight.  She recalls having a heart murmur diagnosed long ago and underwent an echocardiogram a few years back, which showed normal heart function.   Wt Readings from Last 3 Encounters:  12/19/23 107 lb (48.5 kg)  09/03/23 104 lb 1.3 oz (47.2 kg)  05/02/23 95 lb (43.1 kg)     Prior Data     Results      CT A/P and CTA chest 03/13/22: -Negative for acute PE -Emphysema without acute airspace disease, enlarged pulmonary trunk suggesting pulmonary arterial hypertension -Possible mild wall thickening and mucosal enhancement of gastric antrum possibly secondary to gastritis -Multiple bilateral kidney stones without hydronephrosis or ureteral stone, calcified uterine fibroids -Contracted gallbladder, no cholelithiasis or hepatic abnormality.   EGD May 2014: -Schatzki's ring at the GE junction s/p dilation -Moderate nonerosive gastritis with hemorrhage -Continue omeprazole  daily -Follow GERD diet -BPE if dysphagia persists.   Colonoscopy May 2014: -Mild diverticulosis in the cecum -Moderate size internal hemorrhoids -Follow high-fiber diet and avoid items that cause bloating -Repeat colonoscopy in 10 years    Medications   Current Outpatient Medications  Medication Sig Dispense Refill   albuterol  (VENTOLIN  HFA) 108 (90 Base) MCG/ACT inhaler Inhale 2 puffs into the lungs every 6 (six) hours as needed for wheezing  or shortness of breath. 8 g 2   citalopram  (CELEXA ) 20 MG tablet Take 1 tablet by mouth once daily 90 tablet 0   Multiple Vitamins-Minerals (CENTRUM SILVER PO) Take by mouth.     omeprazole   (PRILOSEC) 40 MG capsule Take 1 capsule (40 mg total) by mouth daily. 30 capsule 3   sucralfate  (CARAFATE ) 1 g tablet TAKE 1 TABLET BY MOUTH THREE TIMES DAILY BEFORE MEAL(S) 90 tablet 1   No current facility-administered medications for this visit.    Allergies   Allergies as of 12/19/2023 - Review Complete 12/19/2023  Allergen Reaction Noted   Penicillins Rash 03/17/2011     Past Medical History   Past Medical History:  Diagnosis Date   Acid reflux    Asthma    Asthma    [employer]rees[employer] 05/21/2020   COPD (chronic obstructive pulmonary disease) (HCC)    Depression    Depression    [employer] 05/21/2020   Hypertension    Hypothyroidism    used to take thyroid  medication   Oxygen dependent    1-2l/m [place] at night and PRN. NO LONGER REQUIRING OXYGEN    Past Surgical History   Past Surgical History:  Procedure Laterality Date   COLONOSCOPY, ESOPHAGOGASTRODUODENOSCOPY (EGD) AND ESOPHAGEAL DILATION N/A 06/17/2012   DOQ:UPRD IN CECUM/Mod IH/ESO ring/Gastritis    Past Family History   Family History  Problem Relation Age of Onset   Asthma Mother    Heart attack Father    Congestive Heart Failure Father    Diabetes Sister    Hyperlipidemia Brother    Hypertension Brother    Asthma Brother    Hypertension Brother    Colon cancer Neg Hx    Inflammatory bowel disease Neg Hx     Past Social History   Social History   Socioeconomic History   Marital status: Single    Spouse name: Not on file   Number of children: 1   Years of education: Not on file   Highest education level: Not on file  Occupational History   Occupation: unemployed  Tobacco Use   Smoking status: Former    Current packs/day: 0.00    Average packs/day: 0.5 packs/day for 22.0 years (11.0 ttl pk-yrs)    Types: Cigarettes    Start date: 01/30/1973    Quit date: 01/31/1995    Years since quitting: 28.9   Smokeless tobacco: Never  Vaping Use   Vaping status: Never Used  Substance and Sexual Activity    Alcohol use: Not Currently    Comment: pt denies   Drug use: No   Sexual activity: Not Currently    Birth control/protection: Post-menopausal  Other Topics Concern   Not on file  Social History Narrative   Daughter present with her today   Social Drivers of Health   Financial Resource Strain: Medium Risk (05/02/2023)   Overall Financial Resource Strain (CARDIA)    Difficulty of Paying Living Expenses: Somewhat hard  Food Insecurity: No Food Insecurity (05/02/2023)   Hunger Vital Sign    Worried About Running Out of Food in the Last Year: Never true    Ran Out of Food in the Last Year: Never true  Transportation Needs: No Transportation Needs (05/02/2023)   PRAPARE - Administrator, Civil Service (Medical): No    Lack of Transportation (Non-Medical): No  Physical Activity: Inactive (05/02/2023)   Exercise Vital Sign    Days of Exercise per Week: 0 days    Minutes of Exercise  per Session: 0 min  Stress: Stress Concern Present (05/02/2023)   [employer] of Occupational Health - Occupational Stress Questionnaire    Feeling of Stress : To some extent  Social Connections: Socially Isolated (05/02/2023)   Social Connection and Isolation Panel    Frequency of Communication with Friends and Family: Three times a week    Frequency of Social Gatherings with Friends and Family: Three times a week    Attends Religious Services: Never    Active Member of Clubs or Organizations: No    Attends Banker Meetings: Never    Marital Status: Never married  Intimate Partner Violence: Not At Risk (05/02/2023)   Humiliation, Afraid, Rape, and Kick questionnaire    Fear of Current or Ex-Partner: No    Emotionally Abused: No    Physically Abused: No    Sexually Abused: No    Review of Systems   General: Negative for anorexia, weight loss, fever, chills, fatigue, weakness. SEE HPI ENT: Negative for hoarseness, difficulty swallowing , nasal congestion. CV: Negative for chest  pain, angina, palpitations, dyspnea on exertion, peripheral edema.  Respiratory: Negative for dyspnea at rest, dyspnea on exertion, cough, sputum, wheezing.  GI: See history of present illness. GU:  Negative for dysuria, hematuria, urinary incontinence, urinary frequency, nocturnal urination.  Endo: Negative for unusual weight change.     Physical Exam   BP (!) 85/60   Pulse 84   Temp 98.1 F (36.7 C) (Oral)   Ht 5' 1 (1.549 m)   Wt 107 lb (48.5 kg)   SpO2 93%   BMI 20.22 kg/m   BP 66/39-->85/60-->68/44-->98/78   General: Well-nourished, well-developed in no acute distress.  Eyes: No icterus. Mouth: Oropharyngeal mucosa moist and pink   Lungs: Clear to auscultation bilaterally.  Heart: Regular rate and rhythm, no murmurs rubs or gallops.  Abdomen: Bowel sounds are normal, nontender, nondistended, no hepatosplenomegaly or masses,  no abdominal bruits or hernia , no rebound or guarding.  Rectal: not performed Extremities: No lower extremity edema. No clubbing or deformities. Neuro: Alert and oriented x 4   Skin: Warm and dry, no jaundice.   Psych: Alert and cooperative, normal mood and affect.  Labs   Lab Results  Component Value Date   NA 140 09/03/2023   CL 101 09/03/2023   K 4.0 09/03/2023   CO2 24 09/03/2023   BUN 18 09/03/2023   CREATININE 1.33 (H) 09/03/2023   EGFR 44 (L) 09/03/2023   CALCIUM  9.2 09/03/2023   ALBUMIN  3.8 (L) 09/03/2023   GLUCOSE 83 09/03/2023   Lab Results  Component Value Date   ALT 14 09/03/2023   AST 22 09/03/2023   ALKPHOS 135 (H) 09/03/2023   BILITOT 0.3 09/03/2023   Lab Results  Component Value Date   WBC 4.7 09/03/2023   HGB 12.2 09/03/2023   HCT 37.9 09/03/2023   MCV 94 09/03/2023   PLT 301 09/03/2023   Lab Results  Component Value Date   TSH 1.320 09/03/2023   Lab Results  Component Value Date   HGBA1C 5.7 (H) 09/03/2023    Imaging Studies   No results found.  Assessment/Plan:    Assessment &  Plan Hypotension with dizziness and orthostatic symptoms Chronic orthostatic hypotension with frequent dizziness and low blood pressure, exacerbated by dehydration. No current antihypertensive medication.   -BP 66/39-->85/60-->68/44-->98/78 in our office today. When I saw her in 07/2022 her BP was 140/90 however looking back through our older records as far back as  2018 she has had low normal BPs (not on hypertension medication). She is having orthostatic type symptoms today.  - Encouraged increased fluid intake. - Encouraged her to reach out to her PCP for management.  Gastroesophageal reflux disease with dysphagia and chest pain Chronic GERD with dysphagia and intermittent chest pain. Symptoms include food impaction and regurgitation. Current management with sucralfate  and omeprazole  is beneficial. - Refilled sucralfate  prescription. - Consider endoscopy to evaluate swallowing difficulties and potential need for esophageal dilation.  Irritable bowel syndrome with diarrhea and constipation Chronic IBS with alternating diarrhea and constipation, exacerbated by spicy foods. -avoid food triggers  -overdue for colonoscopy with last one in 2014 but at this time need to address dysphagia.  -consider colonoscopy at later date    Sonny RAMAN. Ezzard, MHS, PA-C [facility]

## 2023-12-30 ENCOUNTER — Encounter: Payer: Self-pay | Admitting: Gastroenterology

## 2024-01-07 ENCOUNTER — Ambulatory Visit: Admitting: Family Medicine

## 2024-01-28 ENCOUNTER — Other Ambulatory Visit: Payer: Self-pay

## 2024-01-28 DIAGNOSIS — K219 Gastro-esophageal reflux disease without esophagitis: Secondary | ICD-10-CM

## 2024-01-28 MED ORDER — OMEPRAZOLE 40 MG PO CPDR: 40.0000 mg | DELAYED_RELEASE_CAPSULE | Freq: Every day | ORAL | 3 refills | Status: AC

## 2024-02-02 ENCOUNTER — Telehealth: Payer: Self-pay | Admitting: Gastroenterology

## 2024-02-02 NOTE — Telephone Encounter (Signed)
 Tammy,   I saw patient November, we were waiting for her to see her PCP last month regarding low BP prior to scheduling her EGD. Looks like she no showed the appt with PCP.   At this point we could see her back to regroup but if her BP remains low we will not be able to schedule her EGD. Please offer her appt.   If they are able to check BP at home and top number remains less than 90 and/or she is still having orthostatic symptoms (dizzy with standing etc) she should see her PCP first and have this addressed.

## 2024-02-04 NOTE — Telephone Encounter (Signed)
 Pt's daughter/caregiver stated that the pt was sick the day of her appt and had to cancel. She is still having orthostatic symptoms daily, daughter stated that she would reach out to pcp to try to get an appt to have that addressed first.

## 2024-02-04 NOTE — Telephone Encounter (Signed)
 noted

## 2024-02-20 ENCOUNTER — Other Ambulatory Visit: Payer: Self-pay | Admitting: Family Medicine

## 2024-02-20 DIAGNOSIS — F339 Major depressive disorder, recurrent, unspecified: Secondary | ICD-10-CM

## 2024-02-26 ENCOUNTER — Other Ambulatory Visit: Payer: Self-pay | Admitting: Family Medicine

## 2024-02-26 DIAGNOSIS — F339 Major depressive disorder, recurrent, unspecified: Secondary | ICD-10-CM

## 2024-03-05 ENCOUNTER — Telehealth: Payer: Self-pay | Admitting: Family Medicine

## 2024-03-05 NOTE — Telephone Encounter (Signed)
 Daughter states it's just a letter stating she is the caregiver, says Meade has written it for her in the past

## 2024-03-05 NOTE — Telephone Encounter (Unsigned)
 Copied from CRM #[id number]. Topic: General - Other >> Mar 05, 2024 12:39 PM Deaijah H wrote: Reason for CRM: Patients daughters called in to Request paperwork regarding her responsibility over her mother. Need before middle of next week.

## 2024-05-06 ENCOUNTER — Ambulatory Visit
